# Patient Record
Sex: Male | Born: 1985 | Race: Black or African American | Hispanic: No | Smoking: Current every day smoker
Health system: Southern US, Community
[De-identification: ages and names within clinical notes are randomized; demographics above are authoritative.]

## PROBLEM LIST (undated history)

## (undated) DIAGNOSIS — F419 Anxiety disorder, unspecified: Secondary | ICD-10-CM

## (undated) DIAGNOSIS — D649 Anemia, unspecified: Secondary | ICD-10-CM

## (undated) DIAGNOSIS — I10 Essential (primary) hypertension: Secondary | ICD-10-CM

## (undated) DIAGNOSIS — R569 Unspecified convulsions: Secondary | ICD-10-CM

## (undated) DIAGNOSIS — F329 Major depressive disorder, single episode, unspecified: Secondary | ICD-10-CM

## (undated) DIAGNOSIS — F32A Depression, unspecified: Secondary | ICD-10-CM

## (undated) HISTORY — PX: WISDOM TOOTH EXTRACTION: SHX21

## (undated) HISTORY — DX: Anemia, unspecified: D64.9

---
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
## 2007-11-02 ENCOUNTER — Emergency Department (HOSPITAL_COMMUNITY): Admission: EM | Admit: 2007-11-02 | Discharge: 2007-11-02 | Payer: Self-pay | Admitting: Emergency Medicine

## 2007-11-03 ENCOUNTER — Emergency Department (HOSPITAL_COMMUNITY): Admission: EM | Admit: 2007-11-03 | Discharge: 2007-11-03 | Payer: Self-pay | Admitting: Emergency Medicine

## 2008-11-24 ENCOUNTER — Emergency Department (HOSPITAL_COMMUNITY): Admission: EM | Admit: 2008-11-24 | Discharge: 2008-11-24 | Payer: Self-pay | Admitting: Emergency Medicine

## 2008-11-26 ENCOUNTER — Emergency Department (HOSPITAL_COMMUNITY): Admission: EM | Admit: 2008-11-26 | Discharge: 2008-11-26 | Payer: Self-pay | Admitting: Emergency Medicine

## 2009-07-14 ENCOUNTER — Emergency Department (HOSPITAL_COMMUNITY): Admission: EM | Admit: 2009-07-14 | Discharge: 2009-07-14 | Payer: Self-pay | Admitting: Emergency Medicine

## 2009-07-16 ENCOUNTER — Emergency Department (HOSPITAL_COMMUNITY): Admission: EM | Admit: 2009-07-16 | Discharge: 2009-07-16 | Payer: Self-pay | Admitting: Family Medicine

## 2010-01-08 ENCOUNTER — Emergency Department (HOSPITAL_COMMUNITY): Admission: EM | Admit: 2010-01-08 | Discharge: 2010-01-08 | Payer: Self-pay | Admitting: Family Medicine

## 2010-01-09 ENCOUNTER — Emergency Department (HOSPITAL_COMMUNITY): Admission: EM | Admit: 2010-01-09 | Discharge: 2010-01-09 | Payer: Self-pay | Admitting: Emergency Medicine

## 2010-10-18 ENCOUNTER — Emergency Department (HOSPITAL_COMMUNITY)
Admission: EM | Admit: 2010-10-18 | Discharge: 2010-10-18 | Disposition: A | Discharge status: Other Acute Inpatient Hospital | Payer: Self-pay | Source: Home / Self Care | Admitting: Family Medicine

## 2010-10-18 ENCOUNTER — Emergency Department (HOSPITAL_COMMUNITY)
Admission: EM | Admit: 2010-10-18 | Discharge: 2010-10-19 | Payer: Self-pay | Source: Home / Self Care | Admitting: Emergency Medicine

## 2010-10-24 ENCOUNTER — Emergency Department (HOSPITAL_COMMUNITY)
Admission: EM | Admit: 2010-10-24 | Discharge: 2010-10-25 | Payer: Self-pay | Source: Home / Self Care | Admitting: Emergency Medicine

## 2011-01-09 LAB — COMPREHENSIVE METABOLIC PANEL
ALT: 13 U/L (ref 0–53)
ALT: 13 U/L (ref 0–53)
Alkaline Phosphatase: 57 U/L (ref 39–117)
Alkaline Phosphatase: 65 U/L (ref 39–117)
BUN: 9 mg/dL (ref 6–23)
CO2: 24 mEq/L (ref 19–32)
CO2: 28 mEq/L (ref 19–32)
Calcium: 9.5 mg/dL (ref 8.4–10.5)
Chloride: 103 mEq/L (ref 96–112)
GFR calc non Af Amer: >60 mL/min (ref >60)
GFR calc non Af Amer: >60 mL/min (ref >60)
Glucose, Bld: 113 mg/dL — ABNORMAL HIGH (ref 70–99)
Glucose, Bld: 93 mg/dL (ref 70–99)
Potassium: 3.8 mEq/L (ref 3.5–5.1)
Sodium: 138 mEq/L (ref 135–145)
Sodium: 141 mEq/L (ref 135–145)
Total Bilirubin: 0.6 mg/dL (ref 0.3–1.2)
Total Protein: 6.7 g/dL (ref 6.0–8.3)

## 2011-01-09 LAB — URINALYSIS, ROUTINE W REFLEX MICROSCOPIC
Bilirubin Urine: NEGATIVE
Glucose, UA: NEGATIVE mg/dL
Ketones, ur: NEGATIVE mg/dL
Ketones, ur: NEGATIVE mg/dL
Nitrite: NEGATIVE
Protein, ur: NEGATIVE mg/dL
Specific Gravity, Urine: 1.026 (ref 1.005–1.030)
Urobilinogen, UA: 1 mg/dL (ref 0.0–1.0)
pH: 6.5 (ref 5.0–8.0)

## 2011-01-09 LAB — POCT URINALYSIS DIPSTICK
Glucose, UA: NEGATIVE mg/dL
Nitrite: NEGATIVE
Protein, ur: 30 mg/dL — AB
Urobilinogen, UA: 1 mg/dL (ref 0.0–1.0)

## 2011-01-09 LAB — CBC
HCT: 37.9 % — ABNORMAL LOW (ref 39.0–52.0)
HCT: 40.6 % (ref 39.0–52.0)
Hemoglobin: 13.2 g/dL (ref 13.0–17.0)
Hemoglobin: 14.2 g/dL (ref 13.0–17.0)
MCHC: 34.8 g/dL (ref 30.0–36.0)
MCHC: 35 g/dL (ref 30.0–36.0)
MCV: 90.5 fL (ref 78.0–100.0)
RBC: 4.43 MIL/uL (ref 4.22–5.81)
RDW: 12.6 % (ref 11.5–15.5)

## 2011-01-09 LAB — LIPASE, BLOOD
Lipase: 20 U/L (ref 11–59)
Lipase: 39 U/L (ref 11–59)

## 2011-01-09 LAB — RAPID URINE DRUG SCREEN, HOSP PERFORMED: Barbiturates: NOT DETECTED

## 2011-01-09 LAB — URINE MICROSCOPIC-ADD ON

## 2011-01-09 LAB — DIFFERENTIAL
Basophils Absolute: 0 10*3/uL (ref 0.0–0.1)
Basophils Absolute: 0 10*3/uL (ref 0.0–0.1)
Basophils Relative: 0 % (ref 0–1)
Basophils Relative: 0 % (ref 0–1)
Eosinophils Absolute: 0 10*3/uL (ref 0.0–0.7)
Eosinophils Absolute: 0.4 10*3/uL (ref 0.0–0.7)
Eosinophils Relative: 6 % — ABNORMAL HIGH (ref 0–5)
Lymphs Abs: 3.6 10*3/uL (ref 0.7–4.0)
Neutro Abs: 13.3 10*3/uL — ABNORMAL HIGH (ref 1.7–7.7)
Neutrophils Relative %: 44 % (ref 43–77)
Neutrophils Relative %: 86 % — ABNORMAL HIGH (ref 43–77)

## 2011-02-13 LAB — CULTURE, ROUTINE-ABSCESS

## 2011-05-28 ENCOUNTER — Emergency Department (HOSPITAL_COMMUNITY): Payer: Medicaid Other

## 2011-05-28 ENCOUNTER — Emergency Department (HOSPITAL_COMMUNITY)
Admission: EM | Admit: 2011-05-28 | Discharge: 2011-05-28 | Disposition: A | Discharge status: Home / Self Care | Payer: Medicaid Other | Attending: Emergency Medicine | Admitting: Emergency Medicine

## 2011-05-28 DIAGNOSIS — F17200 Nicotine dependence, unspecified, uncomplicated: Secondary | ICD-10-CM | POA: Insufficient documentation

## 2011-05-28 DIAGNOSIS — H6120 Impacted cerumen, unspecified ear: Secondary | ICD-10-CM | POA: Insufficient documentation

## 2011-05-28 DIAGNOSIS — R079 Chest pain, unspecified: Secondary | ICD-10-CM | POA: Insufficient documentation

## 2011-05-28 DIAGNOSIS — H60399 Other infective otitis externa, unspecified ear: Secondary | ICD-10-CM | POA: Insufficient documentation

## 2011-05-31 ENCOUNTER — Inpatient Hospital Stay (INDEPENDENT_AMBULATORY_CARE_PROVIDER_SITE_OTHER)
Admission: RE | Admit: 2011-05-31 | Discharge: 2011-05-31 | Disposition: A | Discharge status: Home / Self Care | Payer: Medicaid Other | Source: Ambulatory Visit | Attending: Family Medicine | Admitting: Family Medicine

## 2011-05-31 DIAGNOSIS — H6120 Impacted cerumen, unspecified ear: Secondary | ICD-10-CM

## 2012-07-30 ENCOUNTER — Encounter (HOSPITAL_COMMUNITY): Payer: Self-pay | Admitting: Family Medicine

## 2012-07-30 ENCOUNTER — Emergency Department (HOSPITAL_COMMUNITY)
Admission: EM | Admit: 2012-07-30 | Discharge: 2012-07-30 | Disposition: A | Discharge status: Home / Self Care | Payer: Medicaid Other | Attending: Emergency Medicine | Admitting: Emergency Medicine

## 2012-07-30 DIAGNOSIS — I10 Essential (primary) hypertension: Secondary | ICD-10-CM | POA: Insufficient documentation

## 2012-07-30 DIAGNOSIS — J392 Other diseases of pharynx: Secondary | ICD-10-CM

## 2012-07-30 DIAGNOSIS — F17200 Nicotine dependence, unspecified, uncomplicated: Secondary | ICD-10-CM | POA: Insufficient documentation

## 2012-07-30 HISTORY — DX: Essential (primary) hypertension: I10

## 2012-07-30 MED ORDER — OXYCODONE-ACETAMINOPHEN 5-325 MG PO TABS: 1.0000 | ORAL_TABLET | Freq: Four times a day (QID) | ORAL | Status: DC | PRN

## 2012-07-30 MED ORDER — OXYCODONE-ACETAMINOPHEN 5-325 MG PO TABS
2.0000 | ORAL_TABLET | Freq: Once | ORAL | Status: AC
  Administered 2012-07-30: 2 via ORAL
  Filled 2012-07-30: qty 2

## 2012-07-30 NOTE — ED Notes (Signed)
Pt states he has a ride home

## 2012-07-30 NOTE — ED Notes (Signed)
Pt states "I ain't ate nothing since Sunday, cause it hurts so bad when I eat." Pt speaks in complete sentences. No acute distress.

## 2012-07-30 NOTE — ED Provider Notes (Signed)
History     CSN: [id number]  Arrival date & time 07/30/12  2001   First MD Initiated Contact with Patient 07/30/12 2035      Chief Complaint  Patient presents with  . Rash    (Consider location/radiation/quality/duration/timing/severity/associated sxs/prior treatment) HPI Comments: Patient presents with complaint of rash on his upper palate. Patient states that if began on Sunday after he drank 3 liters of straight vodka. Patient states that he noticed the rash while incarcerated on Monday. Patient was incarcerated for assault of his step-father. He states that his throat is sore. Denis fever or chills. Denies difficulty swallowing or SOB.                                     The history is provided by the patient. No language interpreter was used.    Past Medical History  Diagnosis Date  . Hypertension     History reviewed. No pertinent past surgical history.  History reviewed. No pertinent family history.  History  Substance Use Topics  . Smoking status: Current Every Day Smoker -- 2.0 packs/day    Types: Cigarettes  . Smokeless tobacco: Not on file  . Alcohol Use: Yes     heavy drinker      Review of Systems  Constitutional: Negative for fever and chills.  HENT: Positive for sore throat. Negative for trouble swallowing.   Respiratory: Negative for shortness of breath.     Allergies  Review of patient's allergies indicates no known allergies.  Home Medications  No current outpatient prescriptions on file.  BP 151/100  Pulse 72  Temp 98.3 F (36.8 C) (Oral)  Resp 18  SpO2 98%  Physical Exam  Nursing note and vitals reviewed. Constitutional: He appears well-developed and well-nourished. No distress.  HENT:  Head: Normocephalic and atraumatic.  Mouth/Throat: No oropharyngeal exudate.       Patients throat mildly erythematous with some irritation to the upper palate.  Eyes: Conjunctivae normal and EOM are normal.  Neck: Normal range of motion.  Neck supple.  Cardiovascular: Normal rate, regular rhythm and normal heart sounds.   Pulmonary/Chest: Effort normal and breath sounds normal.  Abdominal: Soft. Bowel sounds are normal.  Neurological: He is alert.  Skin: Skin is warm and dry.    ED Course  Procedures (including critical care time)  Labs Reviewed - No data to display No results found.   1. Throat irritation       MDM  Patient presented with sore throat and irritation of the upper palate after heavy drinking on Sunday. Patient given pain medication with some relief. Patient discharged with instructions on supportive care. No red flags for bacterial pharyngitis or peritonsillar abscess.        Pixie Casino, PA-C 07/31/12 0145

## 2012-07-30 NOTE — ED Notes (Signed)
Pt reports he has been drinking a lot of alcohol lately. States noticed bumps to upper mouth x 3 days ago. Reports bumps are painful.

## 2012-07-31 NOTE — ED Provider Notes (Signed)
Medical screening examination/treatment/procedure(s) were performed by non-physician practitioner and as supervising physician I was immediately available for consultation/collaboration. Ronan Duecker, MD, FACEP   Caeleigh Prohaska L Samyra Limb, MD 07/31/12 2301 

## 2014-03-04 ENCOUNTER — Encounter (HOSPITAL_COMMUNITY): Payer: Self-pay | Admitting: Emergency Medicine

## 2014-03-04 ENCOUNTER — Emergency Department (HOSPITAL_COMMUNITY): Payer: Medicaid Other

## 2014-03-04 ENCOUNTER — Emergency Department (HOSPITAL_COMMUNITY)
Admission: EM | Admit: 2014-03-04 | Discharge: 2014-03-04 | Disposition: A | Discharge status: Home / Self Care | Payer: Medicaid Other | Attending: Emergency Medicine | Admitting: Emergency Medicine

## 2014-03-04 DIAGNOSIS — R109 Unspecified abdominal pain: Secondary | ICD-10-CM

## 2014-03-04 DIAGNOSIS — F17200 Nicotine dependence, unspecified, uncomplicated: Secondary | ICD-10-CM | POA: Insufficient documentation

## 2014-03-04 DIAGNOSIS — K529 Noninfective gastroenteritis and colitis, unspecified: Secondary | ICD-10-CM

## 2014-03-04 DIAGNOSIS — I10 Essential (primary) hypertension: Secondary | ICD-10-CM | POA: Insufficient documentation

## 2014-03-04 DIAGNOSIS — K5289 Other specified noninfective gastroenteritis and colitis: Secondary | ICD-10-CM | POA: Insufficient documentation

## 2014-03-04 LAB — COMPREHENSIVE METABOLIC PANEL
ALBUMIN: 4.7 g/dL (ref 3.5–5.2)
ALK PHOS: 70 U/L (ref 39–117)
ALT: 8 U/L (ref 0–53)
AST: 19 U/L (ref 0–37)
BUN: 9 mg/dL (ref 6–23)
CHLORIDE: 100 meq/L (ref 96–112)
CO2: 20 mEq/L (ref 19–32)
Calcium: 9.7 mg/dL (ref 8.4–10.5)
Creatinine, Ser: 1.03 mg/dL (ref 0.50–1.35)
GFR calc Af Amer: >90 mL/min (ref >90)
GFR calc non Af Amer: >90 mL/min (ref >90)
Glucose, Bld: 95 mg/dL (ref 70–99)
POTASSIUM: 3.4 meq/L — AB (ref 3.7–5.3)
Sodium: 140 mEq/L (ref 137–147)
Total Bilirubin: 1.2 mg/dL (ref 0.3–1.2)
Total Protein: 7.8 g/dL (ref 6.0–8.3)

## 2014-03-04 LAB — URINALYSIS, ROUTINE W REFLEX MICROSCOPIC
Bilirubin Urine: NEGATIVE
Glucose, UA: NEGATIVE mg/dL
HGB URINE DIPSTICK: NEGATIVE
Ketones, ur: 40 mg/dL — AB
Leukocytes, UA: NEGATIVE
NITRITE: NEGATIVE
PH: 5.5 (ref 5.0–8.0)
Protein, ur: NEGATIVE mg/dL
SPECIFIC GRAVITY, URINE: 1.007 (ref 1.005–1.030)
Urobilinogen, UA: 0.2 mg/dL (ref 0.0–1.0)

## 2014-03-04 LAB — LIPASE, BLOOD: Lipase: 21 U/L (ref 11–59)

## 2014-03-04 LAB — RAPID URINE DRUG SCREEN, HOSP PERFORMED
Amphetamines: NOT DETECTED
BARBITURATES: NOT DETECTED
BENZODIAZEPINES: NOT DETECTED
COCAINE: NOT DETECTED
Opiates: POSITIVE — AB
TETRAHYDROCANNABINOL: POSITIVE — AB

## 2014-03-04 LAB — CBC WITH DIFFERENTIAL/PLATELET
BASOS ABS: 0 10*3/uL (ref 0.0–0.1)
BASOS PCT: 0 % (ref 0–1)
Eosinophils Absolute: 0.2 10*3/uL (ref 0.0–0.7)
Eosinophils Relative: 2 % (ref 0–5)
HCT: 47 % (ref 39.0–52.0)
HEMOGLOBIN: 16.1 g/dL (ref 13.0–17.0)
Lymphocytes Relative: 40 % (ref 12–46)
Lymphs Abs: 4.4 10*3/uL — ABNORMAL HIGH (ref 0.7–4.0)
MCH: 31.1 pg (ref 26.0–34.0)
MCHC: 34.3 g/dL (ref 30.0–36.0)
MCV: 90.9 fL (ref 78.0–100.0)
MONOS PCT: 8 % (ref 3–12)
Monocytes Absolute: 0.9 10*3/uL (ref 0.1–1.0)
NEUTROS ABS: 5.4 10*3/uL (ref 1.7–7.7)
NEUTROS PCT: 50 % (ref 43–77)
Platelets: 248 10*3/uL (ref 150–400)
RBC: 5.17 MIL/uL (ref 4.22–5.81)
RDW: 15 % (ref 11.5–15.5)
WBC: 10.9 10*3/uL — ABNORMAL HIGH (ref 4.0–10.5)

## 2014-03-04 LAB — CK: Total CK: 361 U/L — ABNORMAL HIGH (ref 7–232)

## 2014-03-04 MED ORDER — MORPHINE SULFATE 4 MG/ML IJ SOLN
4.0000 mg | Freq: Once | INTRAMUSCULAR | Status: AC
  Administered 2014-03-04: 4 mg via INTRAVENOUS
  Filled 2014-03-04: qty 1

## 2014-03-04 MED ORDER — ONDANSETRON HCL 4 MG PO TABS: 4.0000 mg | ORAL_TABLET | Freq: Three times a day (TID) | ORAL | Status: DC | PRN

## 2014-03-04 MED ORDER — SODIUM CHLORIDE 0.9 % IV BOLUS (SEPSIS)
1000.0000 mL | Freq: Once | INTRAVENOUS | Status: AC
  Administered 2014-03-04: 1000 mL via INTRAVENOUS

## 2014-03-04 MED ORDER — METRONIDAZOLE 500 MG PO TABS: 500.0000 mg | ORAL_TABLET | Freq: Three times a day (TID) | ORAL | Status: DC

## 2014-03-04 MED ORDER — ONDANSETRON HCL 4 MG/2ML IJ SOLN
4.0000 mg | Freq: Once | INTRAMUSCULAR | Status: AC
  Administered 2014-03-04: 4 mg via INTRAVENOUS
  Filled 2014-03-04: qty 2

## 2014-03-04 MED ORDER — IOHEXOL 350 MG/ML SOLN
100.0000 mL | Freq: Once | INTRAVENOUS | Status: AC | PRN
  Administered 2014-03-04: 100 mL via INTRAVENOUS

## 2014-03-04 MED ORDER — CIPROFLOXACIN HCL 500 MG PO TABS: 500.0000 mg | ORAL_TABLET | Freq: Two times a day (BID) | ORAL | Status: DC

## 2014-03-04 MED ORDER — OXYCODONE-ACETAMINOPHEN 5-325 MG PO TABS: 1.0000 | ORAL_TABLET | ORAL | Status: DC | PRN

## 2014-03-04 NOTE — Discharge Instructions (Signed)
Read the information below.  Use the prescribed medication as directed.  Please discuss all new medications with your pharmacist.  Do not take additional tylenol while taking the prescribed pain medication to avoid overdose.  You may return to the Emergency Department at any time for worsening condition or any new symptoms that concern you.  If you develop high fevers, worsening abdominal pain, uncontrolled vomiting, or are unable to tolerate fluids by mouth, return to the ER for a recheck.     Colitis Colitis is inflammation of the colon. Colitis can be a short-term or long-standing (chronic) illness. Crohn's disease and ulcerative colitis are 2 types of colitis which are chronic. They usually require lifelong treatment. CAUSES  There are many different causes of colitis, including:  Viruses.  Germs (bacteria).  Medicine reactions. SYMPTOMS   Diarrhea.  Intestinal bleeding.  Pain.  Fever.  Throwing up (vomiting).  Tiredness (fatigue).  Weight loss.  Bowel blockage. DIAGNOSIS  The diagnosis of colitis is based on examination and stool or blood tests. X-rays, CT scan, and colonoscopy may also be needed. TREATMENT  Treatment may include:  Fluids given through the vein (intravenously).  Bowel rest (nothing to eat or drink for a period of time).  Medicine for pain and diarrhea.  Medicines (antibiotics) that kill germs.  Cortisone medicines.  Surgery. HOME CARE INSTRUCTIONS   Get plenty of rest.  Drink enough water and fluids to keep your urine clear or pale yellow.  Eat a well-balanced diet.  Call your caregiver for follow-up as recommended. SEEK IMMEDIATE MEDICAL CARE IF:   You develop chills.  You have an oral temperature above 102 F (38.9 C), not controlled by medicine.  You have extreme weakness, fainting, or dehydration.  You have repeated vomiting.  You develop severe belly (abdominal) pain or are passing bloody or tarry stools. MAKE SURE YOU:    Understand these instructions.  Will watch your condition.  Will get help right away if you are not doing well or get worse. Document Released: 11/23/2004 Document Revised: 01/08/2012 Document Reviewed: 02/18/2010 [facility] Patient Information 2014 [place], [place].

## 2014-03-04 NOTE — ED Notes (Signed)
Pt asked to be brought to bathroom, when attempted to get pt up pt complained of pain, and when he would get his pain medication. Pt also began dry heave, pt also complained of feeling hot.  He asked if he could have something to drink, and I stated "not until the dr said it would be ok to, and with his nausea it would not be a good idea".  He then said he felt dehydrated again I stated "that he had fluids running so he should not be dehydrated".  I rechecked pts vital all were with in normal limit.  Pts main concern is pain meds, I let the RN know.

## 2014-03-04 NOTE — ED Notes (Signed)
Bed: WA03 Expected date:  Expected time:  Means of arrival:  Comments: EMS gen pain

## 2014-03-04 NOTE — ED Notes (Signed)
/    Ward GivensIva L Gottfried Standish, MD 03/06/14 1105

## 2014-03-04 NOTE — ED Notes (Signed)
Per PTAR pt states he woke up with ab pain that progressed to all over his body. No n/v/d. Pt alert.

## 2014-03-04 NOTE — ED Provider Notes (Signed)
Medical screening examination/treatment/procedure(s) were performed by non-physician practitioner and as supervising physician I was immediately available for consultation/collaboration.   EKG Interpretation   Date/Time:  Wednesday Mar 04 2014 09:25:03 EDT Ventricular Rate:  101 PR Interval:  121 QRS Duration: 89 QT Interval:  346 QTC Calculation: 448 R Axis:   92 Text Interpretation:  Sinus tachycardia Borderline right axis deviation ST  elevation suggests acute pericarditis Baseline wander in lead(s) V4 Since  last tracing rate faster Confirmed by Tiaunna Buford  MD-I, Deonne Rooks ([id number]) on 03/04/2014  9:29:05 AM      Maxwell AlbeIva Jacqualin Shirkey, MD, Maxwell DellFACEP   Maxwell Tiedt L Joniyah Mallinger, MD 03/04/14 1501

## 2014-03-04 NOTE — ED Notes (Signed)
Patient transported to CT 

## 2014-03-04 NOTE — ED Provider Notes (Signed)
CSN: [id number]     Arrival date & time 03/04/14  0614 History   First MD Initiated Contact with Patient 03/04/14 [phone]     Chief Complaint  Patient presents with  . Abdominal Pain     (Consider location/radiation/quality/duration/timing/severity/associated sxs/prior Treatment) The history is provided by the patient.    Pt reports he began having lower abdominal pain around 2am today, and states it has now spread to include his entire body, specifically his arms and legs.  It is described as a soreness, as if someone has been punching or kicking him.  Has had N/V x 2.  Emesis was the cranberry juice that he drank.  Denies fevers, urinary or bowel changes, testicular pain or swelling, penile discharge.  Denies new or different foods, sick contacts, recent travel.  Has never had abdominal surgery.  Drinks alcohol but not recently.  He did take a pill yesterday from [facility] meant to "cleanse your system" of drugs, but states he has taken this pill before without any problems.  Denies working.  Denies being out the heat for extended periods.  Reports he smokes marijuana but denies other drug use.  Has hx hypertension, diagnosed while in jail, has not followed up and takes nothing for this.     Past Medical History  Diagnosis Date  . Hypertension    History reviewed. No pertinent past surgical history. No family history on file. History  Substance Use Topics  . Smoking status: Current Every Day Smoker -- 2.00 packs/day    Types: Cigarettes  . Smokeless tobacco: Not on file  . Alcohol Use: Yes     Comment: heavy drinker    Review of Systems  Constitutional: Negative for fever.  Respiratory: Negative for cough and shortness of breath.   Cardiovascular: Negative for chest pain.  Gastrointestinal: Positive for abdominal pain. Negative for nausea, vomiting, diarrhea, constipation and blood in stool.  Genitourinary: Negative for dysuria, urgency, frequency, hematuria, discharge, scrotal swelling,  penile pain and testicular pain.  All other systems reviewed and are negative.     Allergies  Review of patient's allergies indicates no known allergies.  Home Medications   Prior to Admission medications   Medication Sig Start Date End Date Taking? Authorizing Provider  OVER THE COUNTER MEDICATION Take 1 tablet by mouth once. For   Yes Historical Provider, MD   BP 147/94  Pulse 73  Temp(Src) 97.5 F (36.4 C) (Oral)  Resp 18  Ht 5\' 6"  (1.676 m)  Wt 140 lb (63.504 kg)  BMI 22.61 kg/m2  SpO2 100% Physical Exam  Nursing note and vitals reviewed. Constitutional: He appears well-developed and well-nourished. No distress.  HENT:  Head: Normocephalic and atraumatic.  Neck: Neck supple.  Cardiovascular: Normal rate, regular rhythm and intact distal pulses.   Pulses intact and equal bilaterally in upper and lower extremities  Pulmonary/Chest: Effort normal and breath sounds normal. No respiratory distress. He has no wheezes. He has no rales.  Abdominal: Soft. He exhibits no distension and no mass. There is tenderness in the epigastric area, suprapubic area, left upper quadrant and left lower quadrant. There is no rebound and no guarding.  Neurological: He is alert. He exhibits normal muscle tone.  Skin: He is not diaphoretic.    ED Course  Procedures (including critical care time) Labs Review Labs Reviewed  CBC WITH DIFFERENTIAL - Abnormal; Notable for the following:    WBC 10.9 (*)    Lymphs Abs 4.4 (*)    All other components  within normal limits  COMPREHENSIVE METABOLIC PANEL - Abnormal; Notable for the following:    Potassium 3.4 (*)    All other components within normal limits  CK - Abnormal; Notable for the following:    Total CK 361 (*)    All other components within normal limits  URINALYSIS, ROUTINE W REFLEX MICROSCOPIC - Abnormal; Notable for the following:    Ketones, ur 40 (*)    All other components within normal limits  URINE RAPID DRUG SCREEN (HOSP  PERFORMED) - Abnormal; Notable for the following:    Opiates POSITIVE (*)    Tetrahydrocannabinol POSITIVE (*)    All other components within normal limits  LIPASE, BLOOD    Imaging Review Ct Angio Chest Pe W/cm &/or Wo Cm  03/04/2014   CLINICAL DATA:  Chest pain and tachycardia.  EXAM: CT ANGIOGRAPHY CHEST WITH CONTRAST  TECHNIQUE: Multidetector CT imaging of the chest was performed using the standard protocol during bolus administration of intravenous contrast. Multiplanar CT image reconstructions and MIPs were obtained to evaluate the vascular anatomy.  CONTRAST:  100mL OMNIPAQUE IOHEXOL 350 MG/ML SOLN  COMPARISON:  Chest x-ray on 05/28/2011  FINDINGS: The pulmonary arteries are well opacified and there is no evidence of pulmonary embolism. The thoracic aorta is also well opacified and normally patent without evidence of aneurysm or dissection.  Significant pulmonary blebs are present in the upper lung zones bilaterally. There is no evidence of pneumothorax, consolidation or mass. No edema or pleural fluid is identified. The heart size is normal. No pericardial fluid is identified. No masses or enlarged lymph nodes.  Bony structures are unremarkable. Visualized upper abdominal structures are unremarkable.  Review of the MIP images confirms the above findings.  IMPRESSION: No evidence of pulmonary embolism or other acute findings. Significant blebs are noted in the upper lung zones bilaterally.   Electronically Signed   By: Irish LackGlenn  Yamagata M.D.   On: 03/04/2014 10:59   Ct Angio Abd/pel W/ And/or W/o  03/04/2014   CLINICAL DATA:  Abdominal pain.  Hypertension.  EXAM: CT ANGIOGRAPHY ABDOMEN AND PELVIS  TECHNIQUE: Multidetector CT imaging of the abdomen and pelvis was performed using the standard protocol during bolus administration of intravenous contrast. Multiplanar reconstructed images including MIPs were obtained and reviewed to evaluate the vascular anatomy.  CONTRAST:  100 mL Omnipaque 350   COMPARISON:  10/24/2010  FINDINGS: ARTERIAL FINDINGS:  Aorta: Minimal plaque along the posterior wall of the aorta on sequence 6, image 46. No evidence for dissection or aneurysm.  Celiac axis: Patent. Limited evaluation of the branch vessels.  Superior mesenteric: Patent.  Left renal:          Patent.  Right renal: Patent. There is an accessory inferior right renal artery.  Inferior mesenteric: Patent.  Left iliac: The left common, internal and external iliac artery are patent without dilatation or stenosis.  Right iliac: The right common, internal and external iliac arteries are patent without dissection or stenosis.  Venous findings: Iliac veins and IVC are patent. Bilateral renal veins are patent. Portal venous system is widely patent. Hepatic veins are patent.  Review of the MIP images confirms the above findings.  NONVASCULAR FINDINGS:  Normal appearance of the liver, gallbladder, spleen, pancreas and adrenal glands. Normal appearance of the right kidney. There are at least 3 low-density structures in left kidney that were present on the previous examination and probably represent small cortical cysts. Fluid in the urinary bladder. No gross abnormality to the prostate. There is  no significant free fluid or lymphadenopathy in the abdomen or pelvis. There is mild wall thickening in the left colon but this could be related to under distension. There is gas within the appendix. No acute bone abnormality.  IMPRESSION: No gross abnormality to the abdominal vasculature. Minimal plaque in the distal abdominal aorta without dissection or aneurysm.  Mild wall thickening of the sigmoid colon and left colon. This finding is probably related to under distention. Recommend clinical correlation with regards to mild colitis.  Left renal cysts.   Electronically Signed   By: Richarda OverlieAdam  Henn M.D.   On: 03/04/2014 12:37     EKG Interpretation   Date/Time:  Wednesday Mar 04 2014 09:25:03 EDT Ventricular Rate:  101 PR Interval:   121 QRS Duration: 89 QT Interval:  346 QTC Calculation: 448 R Axis:   92 Text Interpretation:  Sinus tachycardia Borderline right axis deviation ST  elevation suggests acute pericarditis Baseline wander in lead(s) V4 Since  last tracing rate faster Confirmed by KNAPP  MD-I, IVA ([id number]) on 03/04/2014  9:29:05 AM      9:19 AM Pt with continued pain.  HR was 117 when I walked into the room.  Pt has complained of bilateral arm pain, chest pain, back pain, abdominal pain, and bilateral leg pain.   Reexamination of the abdomen:  Diffuse tenderness of lower abdomen without guarding, or rebound.    9:31 AM Discussed pt with Dr Lynelle DoctorKnapp, will do CT angio chest, abd/pelvis to r/o PE, r/o dissection.    Filed Vitals:   03/04/14 1339  BP: 118/68  Pulse: 70  Temp:   Resp: 20     MDM   Final diagnoses:  Colitis    Afebrile nontoxic patient with left sided abdominal pain and pain throughout body, also N/V found to have colitis on CT.  Mild WBC count elevation.   CK mildly elevated, ketones in urine - likely dehydration.  Given IVF.  Patient did oral PO trial prior to discharge.  D/C home with cipro, flagyl, pain and nausea medication, GI follow up.  Discussed result, findings, treatment, and follow up  with patient.  Pt given return precautions.  Pt verbalizes understanding and agrees with plan.       Trixie Dredgemily Keelan Tripodi, PA-C 03/04/14 1435

## 2014-07-15 ENCOUNTER — Encounter (HOSPITAL_COMMUNITY): Payer: Self-pay | Admitting: Emergency Medicine

## 2014-07-15 ENCOUNTER — Emergency Department (HOSPITAL_COMMUNITY)
Admission: EM | Admit: 2014-07-15 | Discharge: 2014-07-15 | Disposition: A | Discharge status: Home / Self Care | Payer: Medicaid Other | Source: Home / Self Care | Attending: Emergency Medicine | Admitting: Emergency Medicine

## 2014-07-15 ENCOUNTER — Emergency Department (HOSPITAL_COMMUNITY)
Admission: EM | Admit: 2014-07-15 | Discharge: 2014-07-15 | Discharge status: Against Medical Advice | Payer: Medicaid Other | Attending: Emergency Medicine | Admitting: Emergency Medicine

## 2014-07-15 DIAGNOSIS — M791 Myalgia, unspecified site: Secondary | ICD-10-CM

## 2014-07-15 DIAGNOSIS — IMO0001 Reserved for inherently not codable concepts without codable children: Secondary | ICD-10-CM | POA: Insufficient documentation

## 2014-07-15 DIAGNOSIS — I10 Essential (primary) hypertension: Secondary | ICD-10-CM

## 2014-07-15 DIAGNOSIS — R52 Pain, unspecified: Secondary | ICD-10-CM | POA: Insufficient documentation

## 2014-07-15 DIAGNOSIS — F17200 Nicotine dependence, unspecified, uncomplicated: Secondary | ICD-10-CM | POA: Insufficient documentation

## 2014-07-15 DIAGNOSIS — R079 Chest pain, unspecified: Secondary | ICD-10-CM | POA: Insufficient documentation

## 2014-07-15 LAB — CBC WITH DIFFERENTIAL/PLATELET
BASOS ABS: 0.1 10*3/uL (ref 0.0–0.1)
BASOS PCT: 1 % (ref 0–1)
Eosinophils Absolute: 0.2 10*3/uL (ref 0.0–0.7)
Eosinophils Relative: 3 % (ref 0–5)
HEMATOCRIT: 44.9 % (ref 39.0–52.0)
Hemoglobin: 15.7 g/dL (ref 13.0–17.0)
Lymphocytes Relative: 55 % — ABNORMAL HIGH (ref 12–46)
Lymphs Abs: 3 10*3/uL (ref 0.7–4.0)
MCH: 32 pg (ref 26.0–34.0)
MCHC: 35 g/dL (ref 30.0–36.0)
MCV: 91.6 fL (ref 78.0–100.0)
Monocytes Absolute: 0.3 10*3/uL (ref 0.1–1.0)
Monocytes Relative: 6 % (ref 3–12)
NEUTROS ABS: 1.9 10*3/uL (ref 1.7–7.7)
Neutrophils Relative %: 35 % — ABNORMAL LOW (ref 43–77)
Platelets: 185 10*3/uL (ref 150–400)
RBC: 4.9 MIL/uL (ref 4.22–5.81)
RDW: 13 % (ref 11.5–15.5)
WBC: 5.4 10*3/uL (ref 4.0–10.5)

## 2014-07-15 LAB — CK: Total CK: 184 U/L (ref 7–232)

## 2014-07-15 LAB — BASIC METABOLIC PANEL
ANION GAP: 12 (ref 5–15)
BUN: 15 mg/dL (ref 6–23)
CALCIUM: 9.9 mg/dL (ref 8.4–10.5)
CHLORIDE: 101 meq/L (ref 96–112)
CO2: 25 mEq/L (ref 19–32)
Creatinine, Ser: 0.88 mg/dL (ref 0.50–1.35)
GFR calc Af Amer: >90 mL/min (ref >90)
GFR calc non Af Amer: >90 mL/min (ref >90)
Glucose, Bld: 85 mg/dL (ref 70–99)
Potassium: 4.2 mEq/L (ref 3.7–5.3)
Sodium: 138 mEq/L (ref 137–147)

## 2014-07-15 MED ORDER — OXYCODONE-ACETAMINOPHEN 5-325 MG PO TABS
2.0000 | ORAL_TABLET | Freq: Once | ORAL | Status: AC
  Administered 2014-07-15: 2 via ORAL
  Filled 2014-07-15: qty 2

## 2014-07-15 MED ORDER — KETOROLAC TROMETHAMINE 30 MG/ML IJ SOLN
30.0000 mg | Freq: Once | INTRAMUSCULAR | Status: AC
  Administered 2014-07-15: 30 mg via INTRAVENOUS
  Filled 2014-07-15: qty 1

## 2014-07-15 MED ORDER — SODIUM CHLORIDE 0.9 % IV BOLUS (SEPSIS)
1000.0000 mL | Freq: Once | INTRAVENOUS | Status: AC
  Administered 2014-07-15: 1000 mL via INTRAVENOUS

## 2014-07-15 MED ORDER — IBUPROFEN 800 MG PO TABS: 800.0000 mg | ORAL_TABLET | Freq: Three times a day (TID) | ORAL | Status: DC

## 2014-07-15 NOTE — ED Notes (Signed)
Pt. States that the pain medicine did not help him and he wants to go to another hospital. Offered to contact Dr. Effie Shy for pt to speak with. Pt. Refused. Pt requested IV to be taken out. Offered pt. To sign out AMA, pt. Refused to speak. Dr. Effie Shy notified.

## 2014-07-15 NOTE — ED Provider Notes (Signed)
MSE was initiated and I personally evaluated the patient and placed orders (if any) at  11:39 AM on July 15, 2014.  The patient appears stable so that the remainder of the MSE may be completed by another provider. Subjective: Maxwell Faulkner is a(n) 28 y.o. male who presents Severe body aches. 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 He states that this is abnormally heavy work for him and that he generally does lifting at his job. Today he woke up with severe diffuse body aches and myalgias. He denies dark urine however vomited twice. No other complaints at this time.  Objective: Well-developed well-nourished male in no acute distress. He is exquisitely tender to palpation of all of his musculature. No abdominal tenderness. Heart and lung sounds normal.  Assessment: Diffuse myalgias after excessive work. Plan: Patient will get basic labs including a total CK to rule out possible rhabdomyolysis.   Arthor Captain, PA-C 07/15/14 1142  Flint Melter, MD 11/16/14 1003

## 2014-07-15 NOTE — Progress Notes (Signed)
[facility] [employer],   Provided pt with a list of self-pay providers to help patient establish a pcp.

## 2014-07-15 NOTE — ED Provider Notes (Signed)
CSN: [id number]     Arrival date & time 07/15/14  1458 History   First MD Initiated Contact with Patient 07/15/14 1501     Chief Complaint  Patient presents with  . Chest Pain     (Consider location/radiation/quality/duration/timing/severity/associated sxs/prior Treatment) HPI Comments: Patient with past medical history of hypertension, presents emergency department with chief complaints of muscle aches. 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 Patient states that he worked harder than he was used to. He is now complaining of generalized muscle aches. He states that his chest, arms, and legs are very painful. He states that he normally takes Percocet for this pain, but does not have any more medication. He is requesting additional Percocet here. He denies any fevers, shortness of breath, cough, nausea, vomiting, or diarrhea.  He was seen at [facility] ED this morning for the same complaint and left AMA after not receiving any pain medication.  The history is provided by the patient. No language interpreter was used.    Past Medical History  Diagnosis Date  . Hypertension    History reviewed. No pertinent past surgical history. No family history on file. History  Substance Use Topics  . Smoking status: Current Every Day Smoker -- 2.00 packs/day    Types: Cigarettes  . Smokeless tobacco: Not on file  . Alcohol Use: Yes     Comment: heavy drinker    Review of Systems  Constitutional: Negative for fever and chills.  Respiratory: Negative for shortness of breath.   Cardiovascular: Negative for chest pain.  Gastrointestinal: Negative for nausea, vomiting, diarrhea and constipation.  Genitourinary: Negative for dysuria.  Musculoskeletal: Positive for myalgias.  All other systems reviewed and are negative.     Allergies  Review of patient's allergies indicates no known allergies.  Home Medications   Prior to  Admission medications   Not on File   BP 109/76  Temp(Src) 98.2 F (36.8 C) (Oral)  Resp 17  SpO2 100% Physical Exam  Nursing note and vitals reviewed. Constitutional: He is oriented to person, place, and time. He appears well-developed and well-nourished.  HENT:  Head: Normocephalic and atraumatic.  Eyes: Conjunctivae and EOM are normal. Pupils are equal, round, and reactive to light. Right eye exhibits no discharge. Left eye exhibits no discharge. No scleral icterus.  Neck: Normal range of motion. Neck supple. No JVD present.  Cardiovascular: Normal rate, regular rhythm and normal heart sounds.  Exam reveals no gallop and no friction rub.   No murmur heard. Pulmonary/Chest: Effort normal and breath sounds normal. No respiratory distress. He has no wheezes. He has no rales. He exhibits no tenderness.  Clear to auscultation bilaterally  Anterior chest wall tenderness palpation  Abdominal: Soft. He exhibits no distension and no mass. There is no tenderness. There is no rebound and no guarding.  Musculoskeletal: Normal range of motion. He exhibits no edema and no tenderness.  Moves all extremities, no bony abnormality or deformity  Neurological: He is alert and oriented to person, place, and time.  Skin: Skin is warm and dry.  Psychiatric: He has a normal mood and affect. His behavior is normal. Judgment and thought content normal.    ED Course  Procedures (including critical care time) Labs Review Labs Reviewed - No data to display  Imaging Review No results found.   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 Laboratory studies reviewed from earlier ED visit. No concerning findings. No evidence of rhabdo. Chest is tender to palpation. EKG is unchanged from previous. I will treat patient's acute pain here with percocet and discharge to home with NSAIDs. Discussed patient with Dr. Fayrene Fearing, who agrees with plan for NSAIDs and discharge.    Roxy Horseman, PA-C 07/15/14 1525

## 2014-07-15 NOTE — ED Provider Notes (Signed)
CSN: [id number]     Arrival date & time 07/15/14  4098 History   First MD Initiated Contact with Patient 07/15/14 1013     Chief Complaint  Patient presents with  . Generalized Body Aches     (Consider location/radiation/quality/duration/timing/severity/associated sxs/prior Treatment) The history is provided by the patient and a relative.   He complains of generalized myalgia, after doing heavy lifting at work yesterday. He, states he do this because "a machine was broken." He is unable to localize his pain. He denies fever, chills, nausea, vomiting, weakness, or dizziness. There are no other known modifying factors.  Past Medical History  Diagnosis Date  . Hypertension    No past surgical history on file. No family history on file. History  Substance Use Topics  . Smoking status: Current Every Day Smoker -- 2.00 packs/day    Types: Cigarettes  . Smokeless tobacco: Not on file  . Alcohol Use: Yes     Comment: heavy drinker    Review of Systems  All other systems reviewed and are negative.     Allergies  Review of patient's allergies indicates no known allergies.  Home Medications   Prior to Admission medications   Not on File   BP 129/84  Pulse 78  Temp(Src) 98.7 F (37.1 C) (Oral)  Resp 14  Ht  (1.676 m)  Wt 130 lb (58.968 kg)  BMI 20.99 kg/m2  SpO2 100% Physical Exam  Nursing note and vitals reviewed. Constitutional: He is oriented to person, place, and time. He appears well-developed and well-nourished.  HENT:  Head: Normocephalic and atraumatic.  Right Ear: External ear normal.  Left Ear: External ear normal.  Eyes: Conjunctivae and EOM are normal. Pupils are equal, round, and reactive to light.  Neck: Normal range of motion and phonation normal. Neck supple.  Cardiovascular: Normal rate, regular rhythm and normal heart sounds.   Pulmonary/Chest: Effort normal and breath sounds normal. He exhibits no bony tenderness.  Abdominal: Soft. There is  no tenderness.  Musculoskeletal: Normal range of motion.  He moves about on the stretcher slowly secondary to diffuse, muscular pain. There are no deformities or swelling of the large joints of either side.  Neurological: He is alert and oriented to person, place, and time. No cranial nerve deficit or sensory deficit. He exhibits normal muscle tone. Coordination normal.  Skin: Skin is warm, dry and intact. No rash noted. No erythema. No pallor.  Psychiatric: He has a normal mood and affect. His behavior is normal. Judgment and thought content normal.    ED Course  Procedures (including critical care time) Medications  ketorolac (TORADOL) 30 MG/ML injection 30 mg (not administered)  sodium chloride 0.9 % bolus 1,000 mL (1,000 mLs Intravenous New Bag/Given 07/15/14 1108)    Patient Vitals for the past 24 hrs:  BP Temp Temp src Pulse Resp SpO2 Height Weight  07/15/14 0950 129/84 mmHg 98.7 F (37.1 C) Oral 78 14 100 %  (1.676 m) 130 lb (58.968 kg)    The patient left AGAINST MEDICAL ADVICE, suddenly after treatment. He would not stay to talk to anyone.  Labs Review Labs Reviewed  CBC WITH DIFFERENTIAL - Abnormal; Notable for the following:    Neutrophils Relative % 35 (*)    Lymphocytes Relative 55 (*)    All other components within normal limits  BASIC METABOLIC PANEL  CK    Imaging Review No results found.   EKG Interpretation None      MDM  Final diagnoses:  Myalgia    Nonspecific myalgias, without evidence for rhabdomyolysis, metabolic instability, or SBI  Nursing Notes Reviewed/ Care Coordinated Applicable Imaging Reviewed Interpretation of Laboratory Data incorporated into ED treatment   Disposition: AMA    Flint Melter, MD 07/15/14 1554

## 2014-07-15 NOTE — ED Notes (Signed)
Pt given 30 toradol by EMS. EMS EKG unremarkable.

## 2014-07-15 NOTE — Discharge Instructions (Signed)
Muscle Pain  Muscle pain (myalgia) may be caused by many things, including:   Overuse or muscle strain, especially if you are not in shape. This is the most common cause of muscle pain.   Injury.   Bruises.   Viruses, such as the flu.   Infectious diseases.   Fibromyalgia, which is a chronic condition that causes muscle tenderness, fatigue, and headache.   Autoimmune diseases, including lupus.   Certain drugs, including ACE inhibitors and statins.  Muscle pain may be mild or severe. In most cases, the pain lasts only a short time and goes away without treatment. To diagnose the cause of your muscle pain, your health care provider will take your medical history. This means he or she will ask you when your muscle pain began and what has been happening. If you have not had muscle pain for very long, your health care provider may want to wait before doing much testing. If your muscle pain has lasted a long time, your health care provider may want to run tests right away. If your health care provider thinks your muscle pain may be caused by illness, you may need to have additional tests to rule out certain conditions.   Treatment for muscle pain depends on the cause. Home care is often enough to relieve muscle pain. Your health care provider may also prescribe anti-inflammatory medicine.  HOME CARE INSTRUCTIONS  Watch your condition for any changes. The following actions may help to lessen any discomfort you are feeling:   Only take over-the-counter or prescription medicines as directed by your health care provider.   Apply ice to the sore muscle:   Put ice in a plastic bag.   Place a towel between your skin and the bag.   Leave the ice on for 15-20 minutes, 3-4 times a day.   You may alternate applying hot and cold packs to the muscle as directed by your health care provider.   If overuse is causing your muscle pain, slow down your activities until the pain goes away.   Remember that it is normal to feel  some muscle pain after starting a workout program. Muscles that have not been used often will be sore at first.   Do regular, gentle exercises if you are not usually active.   Warm up before exercising to lower your risk of muscle pain.   Do not continue working out if the pain is very bad. Bad pain could mean you have injured a muscle.  SEEK MEDICAL CARE IF:   Your muscle pain gets worse, and medicines do not help.   You have muscle pain that lasts longer than 3 days.   You have a rash or fever along with muscle pain.   You have muscle pain after a tick bite.   You have muscle pain while working out, even though you are in good physical condition.   You have redness, soreness, or swelling along with muscle pain.   You have muscle pain after starting a new medicine or changing the dose of a medicine.  SEEK IMMEDIATE MEDICAL CARE IF:   You have trouble breathing.   You have trouble swallowing.   You have muscle pain along with a stiff neck, fever, and vomiting.   You have severe muscle weakness or cannot move part of your body.  MAKE SURE YOU:    Understand these instructions.   Will watch your condition.   Will get help right away if you are not   questions you have with your health care provider.    Emergency Department Resource Guide 1) Find a Doctor and Pay Out of Pocket Although you won't have to find out who is covered by your insurance plan, it is a good idea to ask around and get recommendations. You will then need to call the office and see if the doctor you have chosen will accept you as a new patient and what types of options they offer for patients who are self-pay. Some doctors offer discounts or will  set up payment plans for their patients who do not have insurance, but you will need to ask so you aren't surprised when you get to your appointment.  2) Contact Your Local Health Department Not all health departments have doctors that can see patients for sick visits, but many do, so it is worth a call to see if yours does. If you don't know where your local health department is, you can check in your phone book. The CDC also has a tool to help you locate your state's health department, and many state websites also have listings of all of their local health departments.  3) Find a Walk-in Clinic If your illness is not likely to be very severe or complicated, you may want to try a walk in clinic. These are popping up all over the country in pharmacies, drugstores, and shopping centers. They're usually staffed by nurse practitioners or physician assistants that have been trained to treat common illnesses and complaints. They're usually fairly quick and inexpensive. However, if you have serious medical issues or chronic medical problems, these are probably not your best option.  No Primary Care Doctor: - Call Health Connect at  [phone] - they can help you locate a primary care doctor that  accepts your insurance, provides certain services, etc. - Physician Referral Service- [phone]  Chronic Pain Problems: Organization         Address  Phone   Notes  [facility]  [phone] Patients need to be referred by their primary care doctor.   Medication Assistance: Organization         Address  Phone   Notes  [facility] Medication [facility] [address] [place] [place], [place] [address] [phone] --Must be a resident of [facility] -- Must have NO insurance coverage whatsoever (no Medicaid/ Medicare, etc.) -- The pt. MUST have a primary care doctor that directs their care regularly and follows them in the community   [facility]  [phone]    [employer]  [phone]    Agencies that provide inexpensive medical care: Organization         Address  Phone   Notes  [facility]  [phone]   [facility]    [phone]   [facility] [address] [place], [place] [address] [phone]   [facility] [address] [place] [place] [phone]   Planned Parenthood    [phone]   [facility]    [phone]   [facility]  [address], [place] Phone:  [phone], Fax:  [phone] Hours of Operation:  9 am - 6 pm, M-F.  Also accepts Medicaid/Medicare and self-pay.  [facility]  [address], [place] Phone: [phone], Fax: [phone]. Hours of Operation:  8:30 am - 5:30 pm,  M-F.  Also accepts Medicaid and self-pay.  [facility] [place] [address], [place] Phone: [phone]   [facility] [address] Natasha Bence [place], [place] [phone], Ext. [phone] Mondays & Thursdays: 7-9 AM.  First 15 patients are seen on a first come, first serve basis.    Medicaid-accepting [facility] Providers:  Organization         Address  Phone   Notes  [facility] [address], [place] [phone] Also accepts self-pay patients.  [facility] [address] Laurell Josephs [place], [place]  [phone]   [facility] [address], [place] [phone]   [facility] [address], [place] [phone]   Renaye Rakers [address], [place]   [phone] Only accepts [place] Access [place] patients after they have their name applied to their card.   Self-Pay (no insurance) in [facility]:  Organization         Address  Phone   Notes  Sickle Cell Patients, Dr [facility] [address] [place],  [place] [phone]   [facility] [address] [place], [place] [phone]   [facility] [place]  [address], [place] [phone]   [facility]/Dr. Osei-Bonsu  [address] [place] or [address], [place] [phone] Phone number for both [place] and [place] locations is the same.  [facility] [address] [place] [phone]   [facility] [address], [place] or [address] [phone]   [facility] [address], [place] [phone], phone; [phone], fax Sees patients 1st and 3rd Saturday of every month.  Must not qualify for public or private insurance (i.e. Medicaid, Medicare, [place] Health Choice, Veterans' Benefits)  Household income should be no more than 200% of the poverty level The clinic cannot treat you if you are pregnant or think you are pregnant  Sexually transmitted diseases are not treated at the clinic.    Dental Care: Organization         Address  Phone  Notes  [facility] Department of [facility] [address] [place], [place] [phone] Accepts children up to age 43 who are enrolled in [place] or [place] Health Choice; pregnant women with a Medicaid card; and children who have applied for Medicaid or [place] Health Choice, but were declined, whose parents can pay a reduced fee at time of service.  [facility]  [address], [place] [phone] Accepts children up to age 53 who are enrolled in [place] or [place] Health Choice; pregnant women with a Medicaid card; and children who have applied for Medicaid or [place] Health Choice, but were declined, whose parents can pay a reduced fee at time of service.  [facility]  [address] [place], [place] [phone] Patients are seen by appointment only. Walk-ins are not accepted. [facility] will see patients 52 years of age and older. Monday - Tuesday (8am-5pm) Most Wednesdays (8:30-5pm) $30 per visit, cash only  [facility] Adult Dental Access PROGRAM  [address], [place] [facility] [phone] Patients are seen by appointment only. Walk-ins are not accepted.  [facility] will see patients 33 years of age and older. One Wednesday Evening (Monthly: Volunteer Based).  $30 per visit, cash only  [employer]  [phone] for adults; Children under age 38, call Graduate Pediatric Dentistry at [phone]. Children aged 26-14, please call [phone] to request a pediatric application.  Dental services are provided in all areas of dental care including fillings, crowns and bridges, complete and partial dentures, implants, gum treatment, root canals, and extractions. Preventive care is also provided. Treatment is provided to both adults and children. Patients are selected via a lottery and there is often a waiting list.   [facility] [address] [place]  [phone] [email or web address]   [facility] [address] [place], [place] [phone], Ext. [phone] Second and Fourth Thursday of each month, opens at 6:30 AM; Clinic ends at 9 AM.  Patients are seen on a first-come first-served basis, and a limited number are seen during each clinic.   [facility]  [address] Ether Griffins [place], [place] [phone]   Eligibility Requirements You must have lived in [place], [place], or [place] counties for at least the last three months.   You cannot be eligible for state or federal sponsored [place], including [employer], [place], or [employer].   You generally cannot be eligible for healthcare insurance through your employer.    How to apply: Eligibility screenings are held every Tuesday and Wednesday afternoon  from 1:00 pm until 4:00 pm. You do not need an appointment for the interview!  [facility] [address], [place], [place] [phone]   [facility] Health Department  [phone]   [facility] Health Department  [phone]   [facility]  [phone]    Behavioral Health Resources in the Community: Intensive Outpatient Programs Organization         Address  Phone  Notes  [facility] [address] [place], [place] [phone]   [facility] Outpatient [address] [place], [place] [phone]   [facility] [address], [place], [place]  [phone]   [facility] Mental Health [address]  [place], [place] [phone] or [phone]   Substance Abuse Resources Organization         Address  Phone  Notes  [facility] Drug Services  7184990929   [facility]  [phone]   [facility] [place]  [phone]   [place]oydene Flock  [phone]   Residential & Outpatient Substance Abuse Program  [phone]   Psychological Services Organization         Address  Phone  Notes  [facility] Behavioral Health  [phone]   [facility]  [phone]   [facility] Mental Health [address] [place] [phone] or [phone]    Mobile Crisis Teams Organization         Address  Phone  Notes  [facility], Mobile Crisis Care Unit  [phone]   [facility]  [address] [place], [place] [phone]   Doristine Locks [address] [place] [phone]    Self-Help/Support Groups Organization         Address  Phone             Notes  [facility] [place] - variety of support groups  336- I7437963 Call for more information  Narcotics Anonymous (NA), [facility] [address], Arlean Hopping [place]  2 meetings at this location   Residential Treatment  Programs Organization         Address  Phone  Notes  [facility] [address],    [place]  [phone]   [facility]  [address] [place] [address], [place], [place]   [place], [place] [phone] Admissions: 8am-3pm M-F  Incentives Substance [place] [address]    [place], [place] [phone]   [facility] [address] [place], [place], [place]   [facility] [address]  [place], [place]   [facility] - Intensive Outpatient [address] Kristeen Mans [address], [place], [place]   [facility] [place]  [place], [place] [phone] or [phone]   [facility] ([facility]) [address] [place], [place] Accepts Medicaid  [facility] [place] [address]  [place] [phone] Substance Abuse/Addiction Treatment   [facility] Organization         Address  Phone  Notes  [facility]  [phone]   Domenic Schwab, PhD [address] Arlis Porta [place], [place]   4046771725 or [phone]   [place], [place] [phone]   [facility] [address], [place], [place] [phone] Insurance/Medicaid/sponsorship through [facility] Families [address]                                    [place], [place] [phone] [place] [address], [place] [phone]    Dr. Adele Schilder  [phone]   [facility] Dept. 1) [address] [place] 2) [place] 3)  [place] [address], [place] [phone]  [phone]   [place] [phone] or [phone] (After Hours)

## 2014-07-15 NOTE — ED Notes (Signed)
Pt was seen at [facility] this morning. Pt yesterday was moving a pile of bricks and pulled a muscle in his chest. Pt c/o CP. Pt states "they didn't do anything at the other place, nothing is different." Pain is worse with movement.

## 2014-07-15 NOTE — ED Notes (Signed)
Pt. Requesting food. Dr. Effie Shy gave ok. Given cheese, graham crackers, and peanut butter with a water.

## 2014-07-15 NOTE — ED Notes (Signed)
Per EMS: Pt states that he was doing some lifting yesterday that he is not used to.  Pt states that he is now "sore all over".

## 2014-07-21 NOTE — ED Provider Notes (Signed)
Medical screening examination/treatment/procedure(s) were performed by non-physician practitioner and as supervising physician I was immediately available for consultation/collaboration.   EKG Interpretation   Date/Time:  Wednesday July 15 2014 15:06:54 EDT Ventricular Rate:  67 PR Interval:  137 QRS Duration: 90 QT Interval:  392 QTC Calculation: 414 R Axis:   121 Text Interpretation:  Sinus rhythm Right axis deviation Nonspecific T  abnrm, anterolateral leads ST elev, probable normal early repol pattern  -Noted 02/2015 Confirmed by Fayrene Fearing  MD, Patrena Santalucia ([id number]) on 07/15/2014 3:14:55 PM        Rolland Porter, MD 07/21/14 1537

## 2014-11-16 NOTE — ED Provider Notes (Signed)
Medical screening examination/treatment/procedure(s) were conducted as a shared visit with non-physician practitioner(s) and myself.  I personally evaluated the patient during the encounter.   EKG Interpretation   Date/Time:  Wednesday July 15 2014 15:06:54 EDT Ventricular Rate:  67 PR Interval:  137 QRS Duration: 90 QT Interval:  392 QTC Calculation: 414 R Axis:   121 Text Interpretation:  Sinus rhythm Right axis deviation Nonspecific T  abnrm, anterolateral leads ST elev, probable normal early repol pattern  -Noted 02/2015 Confirmed by Fayrene FearingJAMES  MD, MARK ([id number]) on 07/15/2014 3:14:55 PM       Flint MelterElliott L Tenya Araque, MD 11/16/14 [phone]

## 2014-11-29 ENCOUNTER — Emergency Department (HOSPITAL_COMMUNITY)
Admission: EM | Admit: 2014-11-29 | Discharge: 2014-11-30 | Payer: Medicaid Other | Attending: Emergency Medicine | Admitting: Emergency Medicine

## 2014-11-29 ENCOUNTER — Encounter (HOSPITAL_COMMUNITY): Payer: Self-pay | Admitting: *Deleted

## 2014-11-29 DIAGNOSIS — R61 Generalized hyperhidrosis: Secondary | ICD-10-CM | POA: Insufficient documentation

## 2014-11-29 DIAGNOSIS — R079 Chest pain, unspecified: Secondary | ICD-10-CM | POA: Diagnosis present

## 2014-11-29 DIAGNOSIS — Z791 Long term (current) use of non-steroidal anti-inflammatories (NSAID): Secondary | ICD-10-CM | POA: Diagnosis not present

## 2014-11-29 DIAGNOSIS — I10 Essential (primary) hypertension: Secondary | ICD-10-CM | POA: Diagnosis not present

## 2014-11-29 DIAGNOSIS — Z720 Tobacco use: Secondary | ICD-10-CM | POA: Insufficient documentation

## 2014-11-29 DIAGNOSIS — Z8659 Personal history of other mental and behavioral disorders: Secondary | ICD-10-CM | POA: Insufficient documentation

## 2014-11-29 DIAGNOSIS — E876 Hypokalemia: Secondary | ICD-10-CM | POA: Insufficient documentation

## 2014-11-29 DIAGNOSIS — R0789 Other chest pain: Secondary | ICD-10-CM | POA: Diagnosis not present

## 2014-11-29 HISTORY — DX: Depression, unspecified: F32.A

## 2014-11-29 HISTORY — DX: Anxiety disorder, unspecified: F41.9

## 2014-11-29 HISTORY — DX: Major depressive disorder, single episode, unspecified: F32.9

## 2014-11-29 MED ORDER — KETOROLAC TROMETHAMINE 30 MG/ML IJ SOLN
30.0000 mg | Freq: Once | INTRAMUSCULAR | Status: AC
  Administered 2014-11-30: 30 mg via INTRAVENOUS
  Filled 2014-11-29: qty 1

## 2014-11-29 NOTE — ED Notes (Signed)
Dr. Glick at bedside.  

## 2014-11-29 NOTE — ED Provider Notes (Signed)
CSN: [id number]     Arrival date & time 11/29/14  2316 History  This chart was scribed for Dione Booze, MD by Murriel Hopper, ED Scribe. This patient was seen in room A04C/A04C and the patient's care was started at 11:47 PM.    Chief Complaint  Patient presents with  . Chest Pain    The history is provided by the patient. No language interpreter was used.     HPI Comments: Maxwell Faulkner is a 29 y.o. male who presents to the Emergency Department complaining of constant, sharp left-sided chest pain that he rates as 10/10 in severity with associated diaphoresis began an hour ago while he was arguing with police. Pt states that pain increases when he moves and with palpation to the area. Pt states that pain increases when he takes a deep breath as well. Pt states that he smokes a pack per day, and drinks a half gallon of liquor per day as well. Pt states that he has been on a medication that was given to him from behavioral health for about a month, and states that he is out of his medication. Pt denies nausea.   Past Medical History  Diagnosis Date  . Hypertension   . Anxiety   . Depression    History reviewed. No pertinent past surgical history. History reviewed. No pertinent family history. History  Substance Use Topics  . Smoking status: Current Every Day Smoker -- 2.00 packs/day    Types: Cigarettes  . Smokeless tobacco: Not on file  . Alcohol Use: Yes     Comment: heavy drinker    Review of Systems  Constitutional: Positive for diaphoresis.  Cardiovascular: Positive for chest pain.  Gastrointestinal: Negative for nausea.      Allergies  Review of patient's allergies indicates no known allergies.  Home Medications   Prior to Admission medications   Medication Sig Start Date End Date Taking? Authorizing Provider  ibuprofen (ADVIL,MOTRIN) 800 MG tablet Take 1 tablet (800 mg total) by mouth 3 (three) times daily. 07/15/14   Roxy Horseman, PA-C   BP 135/81 mmHg  Pulse  63  Temp(Src) 97.4 F (36.3 C) (Oral)  Resp 18  Ht  (1.676 m)  Wt 120 lb (54.432 kg)  BMI 19.38 kg/m2  SpO2 100% Physical Exam  Constitutional: He is oriented to person, place, and time. He appears well-developed and well-nourished.  HENT:  Head: Normocephalic and atraumatic.  Eyes: Pupils are equal, round, and reactive to light.  Neck: Normal range of motion. Neck supple. No JVD present.  Cardiovascular: Normal rate, regular rhythm and normal heart sounds.   No murmur heard. Pulmonary/Chest: Effort normal and breath sounds normal. No respiratory distress. He has no wheezes. He has no rales.  Mild tenderness upper left anterior chest wall  Abdominal: Bowel sounds are normal. He exhibits no distension and no mass. There is no tenderness.  Musculoskeletal: Normal range of motion.  Lymphadenopathy:    He has no cervical adenopathy.  Neurological: He is alert and oriented to person, place, and time. No cranial nerve deficit. Coordination normal.  Skin: Skin is warm and dry. No rash noted.  Psychiatric: He has a normal mood and affect. His behavior is normal.  Nursing note and vitals reviewed.   ED Course  Procedures (including critical care time)  DIAGNOSTIC STUDIES: Oxygen Saturation is 100% on RA, normal by my interpretation.    COORDINATION OF CARE: 11:53 PM Discussed treatment plan with pt at bedside and pt agreed  to plan.   Labs Review Results for orders placed or performed during the hospital encounter of 11/29/14  Basic metabolic panel  Result Value Ref Range   Sodium 138 135 - 145 mmol/L   Potassium 3.0 (L) 3.5 - 5.1 mmol/L   Chloride 102 96 - 112 mmol/L   CO2 31 19 - 32 mmol/L   Glucose, Bld 93 70 - 99 mg/dL   BUN 7 6 - 23 mg/dL   Creatinine, Ser 9.600.88 0.50 - 1.35 mg/dL   Calcium 9.4 8.4 - 45.410.5 mg/dL   GFR calc non Af Amer >90 >90 mL/min   GFR calc Af Amer >90 >90 mL/min   Anion gap 5 5 - 15  CBC with Differential  Result Value Ref Range   WBC 4.7 4.0 -  10.5 K/uL   RBC 4.40 4.22 - 5.81 MIL/uL   Hemoglobin 13.5 13.0 - 17.0 g/dL   HCT 09.840.4 11.939.0 - 14.752.0 %   MCV 91.8 78.0 - 100.0 fL   MCH 30.7 26.0 - 34.0 pg   MCHC 33.4 30.0 - 36.0 g/dL   RDW 82.912.4 56.211.5 - 13.015.5 %   Platelets 132 (L) 150 - 400 K/uL   Neutrophils Relative % 41 (L) 43 - 77 %   Neutro Abs 1.9 1.7 - 7.7 K/uL   Lymphocytes Relative 45 12 - 46 %   Lymphs Abs 2.2 0.7 - 4.0 K/uL   Monocytes Relative 9 3 - 12 %   Monocytes Absolute 0.4 0.1 - 1.0 K/uL   Eosinophils Relative 4 0 - 5 %   Eosinophils Absolute 0.2 0.0 - 0.7 K/uL   Basophils Relative 1 0 - 1 %   Basophils Absolute 0.0 0.0 - 0.1 K/uL   Imaging Review Dg Chest 2 View  11/30/2014   CLINICAL DATA:  Left-sided chest pain.  EXAM: CHEST  2 VIEW  COMPARISON:  Chest CT 03/04/2014  FINDINGS: Apical blebs, right greater than left, similar to prior CT. There is no pneumothorax. No parenchymal consolidation. Cardiomediastinal contours are normal. There is no pleural effusion. No acute osseous abnormalities.  IMPRESSION: No acute pulmonary process.  Biapical blebs, similar to prior CT.   Electronically Signed   By: Rubye OaksMelanie  Ehinger M.D.   On: 11/30/2014 01:33     EKG Interpretation   Date/Time:  Sunday November 29 2014 23:20:18 EST Ventricular Rate:  53 PR Interval:  148 QRS Duration: 82 QT Interval:  404 QTC Calculation: 379 R Axis:   105 Text Interpretation:  Sinus bradycardia Rightward axis Abnormal ECG Early  repolarization When compared with ECG of 07/15/2014, No significant change  was found Confirmed by [facility]  MD, Kishawn Pickar ([id number]) on 11/29/2014 11:38:05 PM      MDM   Final diagnoses:  Chest pain, unspecified chest pain type  Hypokalemia    Chest pain most consistent with chest wall pain. I have discussed with police and there was some force used in the rest but not excessive. No physical findings to suggest significant trauma. He is given a dose of toradol and x-ray obtained.  Chest x-ray shows no acute process.  Potassium is come back 3.0 and is given oral potassium in the ED. I reviewed his record on the [employer] a controlled substance reporting website and found no reportable prescriptions on file. I do not know what his psychiatric medications are. He is given a single dose of lorazepam in the ED. His discharged with prescriptions for naproxen and K-Dur. He is discharged in police custody and  is going to jail. They will need to contact his mental health provider in the morning to find out what his medications are so that he can be continued on them while in jail.  I personally performed the services described in this documentation, which was scribed in my presence. The recorded information has been reviewed and is accurate.     Dione Booze, MD 11/30/14 [phone]

## 2014-11-29 NOTE — ED Notes (Signed)
Patient presents stating his chest hurts "all over" since the cops were "whooping his ass"

## 2014-11-30 ENCOUNTER — Emergency Department (HOSPITAL_COMMUNITY): Payer: Medicaid Other

## 2014-11-30 LAB — CBC WITH DIFFERENTIAL/PLATELET
Basophils Absolute: 0 10*3/uL (ref 0.0–0.1)
Basophils Relative: 1 % (ref 0–1)
EOS PCT: 4 % (ref 0–5)
Eosinophils Absolute: 0.2 10*3/uL (ref 0.0–0.7)
HCT: 40.4 % (ref 39.0–52.0)
HEMOGLOBIN: 13.5 g/dL (ref 13.0–17.0)
LYMPHS PCT: 45 % (ref 12–46)
Lymphs Abs: 2.2 10*3/uL (ref 0.7–4.0)
MCH: 30.7 pg (ref 26.0–34.0)
MCHC: 33.4 g/dL (ref 30.0–36.0)
MCV: 91.8 fL (ref 78.0–100.0)
MONO ABS: 0.4 10*3/uL (ref 0.1–1.0)
Monocytes Relative: 9 % (ref 3–12)
NEUTROS ABS: 1.9 10*3/uL (ref 1.7–7.7)
Neutrophils Relative %: 41 % — ABNORMAL LOW (ref 43–77)
Platelets: 132 10*3/uL — ABNORMAL LOW (ref 150–400)
RBC: 4.4 MIL/uL (ref 4.22–5.81)
RDW: 12.4 % (ref 11.5–15.5)
WBC: 4.7 10*3/uL (ref 4.0–10.5)

## 2014-11-30 LAB — BASIC METABOLIC PANEL
Anion gap: 5 (ref 5–15)
BUN: 7 mg/dL (ref 6–23)
CHLORIDE: 102 mmol/L (ref 96–112)
CO2: 31 mmol/L (ref 19–32)
CREATININE: 0.88 mg/dL (ref 0.50–1.35)
Calcium: 9.4 mg/dL (ref 8.4–10.5)
GFR calc non Af Amer: >90 mL/min (ref >90)
GLUCOSE: 93 mg/dL (ref 70–99)
POTASSIUM: 3 mmol/L — AB (ref 3.5–5.1)
SODIUM: 138 mmol/L (ref 135–145)

## 2014-11-30 MED ORDER — LORAZEPAM 1 MG PO TABS
1.0000 mg | ORAL_TABLET | Freq: Once | ORAL | Status: AC
  Administered 2014-11-30: 1 mg via ORAL
  Filled 2014-11-30: qty 1

## 2014-11-30 MED ORDER — NAPROXEN 500 MG PO TABS: 500.0000 mg | ORAL_TABLET | Freq: Two times a day (BID) | ORAL | Status: DC

## 2014-11-30 MED ORDER — POTASSIUM CHLORIDE CRYS ER 20 MEQ PO TBCR
40.0000 meq | EXTENDED_RELEASE_TABLET | Freq: Once | ORAL | Status: AC
  Administered 2014-11-30: 40 meq via ORAL
  Filled 2014-11-30: qty 2

## 2014-11-30 MED ORDER — POTASSIUM CHLORIDE CRYS ER 20 MEQ PO TBCR: 20.0000 meq | EXTENDED_RELEASE_TABLET | Freq: Two times a day (BID) | ORAL | Status: DC

## 2014-11-30 NOTE — Discharge Instructions (Signed)
Chest Wall Pain °Chest wall pain is pain in or around the bones and muscles of your chest. It may take up to 6 weeks to get better. It may take longer if you must stay physically active in your work and activities.  °CAUSES  °Chest wall pain may happen on its own. However, it may be caused by: °· A viral illness like the flu. °· Injury. °· Coughing. °· Exercise. °· Arthritis. °· Fibromyalgia. °· Shingles. °HOME CARE INSTRUCTIONS  °· Avoid overtiring physical activity. Try not to strain or perform activities that cause pain. This includes any activities using your chest or your abdominal and side muscles, especially if heavy weights are used. °· Put ice on the sore area. °¨ Put ice in a plastic bag. °¨ Place a towel between your skin and the bag. °¨ Leave the ice on for 15-20 minutes per hour while awake for the first 2 days. °· Only take over-the-counter or prescription medicines for pain, discomfort, or fever as directed by your caregiver. °SEEK IMMEDIATE MEDICAL CARE IF:  °· Your pain increases, or you are very uncomfortable. °· You have a fever. °· Your chest pain becomes worse. °· You have new, unexplained symptoms. °· You have nausea or vomiting. °· You feel sweaty or lightheaded. °· You have a cough with phlegm (sputum), or you cough up blood. °MAKE SURE YOU:  °· Understand these instructions. °· Will watch your condition. °· Will get help right away if you are not doing well or get worse. °Document Released: 10/16/2005 Document Revised: 01/08/2012 Document Reviewed: 06/12/2011 °Exit[employer]® Patient Information ©2015 [employer]. This information is not intended to replace advice given to you by your health care provider. Make sure you discuss any questions you have with your health care provider. ° °Naproxen and naproxen sodium oral immediate-release tablets °What is this medicine? °NAPROXEN (na PROX en) is a non-steroidal anti-inflammatory drug (NSAID). It is used to reduce swelling and to treat pain. This  medicine may be used for dental pain, headache, or painful monthly periods. It is also used for painful joint and muscular problems such as arthritis, tendinitis, bursitis, and gout. °This medicine may be used for other purposes; ask your health care provider or pharmacist if you have questions. °COMMON BRAND NAME(S): Aflaxen, Aleve, Aleve Arthritis, All Day Relief, Anaprox, Anaprox DS, Naprosyn °What should I tell my health care provider before I take this medicine? °They need to know if you have any of these conditions: °-asthma °-cigarette smoker °-drink more than 3 alcohol containing drinks a day °-heart disease or circulation problems such as heart failure or leg edema (fluid retention) °-high blood pressure °-kidney disease °-liver disease °-stomach bleeding or ulcers °-an unusual or allergic reaction to naproxen, aspirin, other NSAIDs, other medicines, foods, dyes, or preservatives °-pregnant or trying to get pregnant °-breast-feeding °How should I use this medicine? °Take this medicine by mouth with a glass of water. Follow the directions on the prescription label. Take it with food if your stomach gets upset. Try to not lie down for at least 10 minutes after you take it. Take your medicine at regular intervals. Do not take your medicine more often than directed. Long-term, continuous use may increase the risk of heart attack or stroke. °A special MedGuide will be given to you by the pharmacist with each prescription and refill. Be sure to read this information carefully each time. °Talk to your pediatrician regarding the use of this medicine in children. Special care may be needed. °Overdosage:   If you think you have taken too much of this medicine contact a poison control center or emergency room at once. °NOTE: This medicine is only for you. Do not share this medicine with others. °What if I miss a dose? °If you miss a dose, take it as soon as you can. If it is almost time for your next dose, take only  that dose. Do not take double or extra doses. °What may interact with this medicine? °-alcohol °-aspirin °-cidofovir °-diuretics °-lithium °-methotrexate °-other drugs for inflammation like ketorolac or prednisone °-pemetrexed °-probenecid °-warfarin °This list may not describe all possible interactions. Give your health care provider a list of all the medicines, herbs, non-prescription drugs, or dietary supplements you use. Also tell them if you smoke, drink alcohol, or use illegal drugs. Some items may interact with your medicine. °What should I watch for while using this medicine? °Tell your doctor or health care professional if your pain does not get better. Talk to your doctor before taking another medicine for pain. Do not treat yourself. °This medicine does not prevent heart attack or stroke. In fact, this medicine may increase the chance of a heart attack or stroke. The chance may increase with longer use of this medicine and in people who have heart disease. If you take aspirin to prevent heart attack or stroke, talk with your doctor or health care professional. °Do not take other medicines that contain aspirin, ibuprofen, or naproxen with this medicine. Side effects such as stomach upset, nausea, or ulcers may be more likely to occur. Many medicines available without a prescription should not be taken with this medicine. °This medicine can cause ulcers and bleeding in the stomach and intestines at any time during treatment. Do not smoke cigarettes or drink alcohol. These increase irritation to your stomach and can make it more susceptible to damage from this medicine. Ulcers and bleeding can happen without warning symptoms and can cause death. °You may get drowsy or dizzy. Do not drive, use machinery, or do anything that needs mental alertness until you know how this medicine affects you. Do not stand or sit up quickly, especially if you are an older patient. This reduces the risk of dizzy or fainting  spells. °This medicine can cause you to bleed more easily. Try to avoid damage to your teeth and gums when you brush or floss your teeth. °What side effects may I notice from receiving this medicine? °Side effects that you should report to your doctor or health care professional as soon as possible: °-black or bloody stools, blood in the urine or vomit °-blurred vision °-chest pain °-difficulty breathing or wheezing °-nausea or vomiting °-severe stomach pain °-skin rash, skin redness, blistering or peeling skin, hives, or itching °-slurred speech or weakness on one side of the body °-swelling of eyelids, throat, lips °-unexplained weight gain or swelling °-unusually weak or tired °-yellowing of eyes or skin °Side effects that usually do not require medical attention (report to your doctor or health care professional if they continue or are bothersome): °-constipation °-headache °-heartburn °This list may not describe all possible side effects. Call your doctor for medical advice about side effects. You may report side effects to FDA at 1-800-FDA-1088. °Where should I keep my medicine? °Keep out of the reach of children. °Store at room temperature between 15 and 30 degrees C (59 and 86 degrees F). Keep container tightly closed. Throw away any unused medicine after the expiration date. °NOTE: This sheet is a summary. It   may not cover all possible information. If you have questions about this medicine, talk to your doctor, pharmacist, or health care provider.  2015, [employer] Standard. (2009-10-18 20:10:16)  Potassium Salts tablets, extended-release tablets or capsules What is this medicine? POTASSIUM (poe TASS i um) is a natural salt that is important for the heart, muscles, and nerves. It is found in many foods and is normally supplied by a well balanced diet. This medicine is used to treat low potassium. This medicine may be used for other purposes; ask your health care provider or pharmacist if you have  questions. COMMON BRAND NAME(S): ED-K+10, Glu-K, K-10, K-8, K-Dur, K-Tab, Kaon-CL, Klor-Con, Klor-Con M10, Klor-Con M15, Klor-Con M20, Klotrix, Micro-K, Micro-K Extencaps, Slow-K What should I tell my health care provider before I take this medicine? They need to know if you have any of these conditions: -dehydration -diabetes -irregular heartbeat -kidney disease -stomach ulcers or other stomach problems -an unusual or allergic reaction to potassium salts, other medicines, foods, dyes, or preservatives -pregnant or trying to get pregnant -breast-feeding How should I use this medicine? Take this medicine by mouth with a full glass of water. Follow the directions on the prescription label. Take with food. Do not suck on, crush, or chew this medicine. If you have difficulty swallowing, ask the pharmacist how to take. Take your medicine at regular intervals. Do not take it more often than directed. Do not stop taking except on your doctor's advice. Talk to your pediatrician regarding the use of this medicine in children. Special care may be needed. Overdosage: If you think you have taken too much of this medicine contact a poison control center or emergency room at once. NOTE: This medicine is only for you. Do not share this medicine with others. What if I miss a dose? If you miss a dose, take it as soon as you can. If it is almost time for your next dose, take only that dose. Do not take double or extra doses. What may interact with this medicine? Do not take this medicine with any of the following medications: -eplerenone -sodium polystyrene sulfonate This medicine may also interact with the following medications: -medicines for blood pressure or heart disease like lisinopril, losartan, quinapril, valsartan -medicines for cold or allergies -medicines for inflammation like ibuprofen, indomethacin -medicines for Parkinson's disease -medicines for the stomach like metoclopramide, dicyclomine,  glycopyrrolate -some diuretics This list may not describe all possible interactions. Give your health care provider a list of all the medicines, herbs, non-prescription drugs, or dietary supplements you use. Also tell them if you smoke, drink alcohol, or use illegal drugs. Some items may interact with your medicine. What should I watch for while using this medicine? Visit your doctor or health care professional for regular check ups. You will need lab work done regularly. You may need to be on a special diet while taking this medicine. Ask your doctor. What side effects may I notice from receiving this medicine? Side effects that you should report to your doctor or health care professional as soon as possible: -allergic reactions like skin rash, itching or hives, swelling of the face, lips, or tongue -black, tarry stools -heartburn -irregular heartbeat -numbness or tingling in hands or feet -pain when swallowing -unusually weak or tired Side effects that usually do not require medical attention (report to your doctor or health care professional if they continue or are bothersome): -diarrhea -nausea -stomach gas -vomiting This list may not describe all possible side effects. Call your doctor  for medical advice about side effects. You may report side effects to FDA at 1-800-FDA-1088. Where should I keep my medicine? Keep out of the reach of children. Store at room temperature between 15 and 30 degrees C (59 and 86 degrees F ). Keep bottle closed tightly to protect this medicine from light and moisture. Throw away any unused medicine after the expiration date. NOTE: This sheet is a summary. It may not cover all possible information. If you have questions about this medicine, talk to your doctor, pharmacist, or health care provider.  2015, [employer] Standard. (2008-01-01 11:17:31)

## 2014-11-30 NOTE — ED Notes (Addendum)
Pt A&OX4, ambulatory at d/c with steady gait in handcuffs, NAD, in police custody.

## 2017-11-16 ENCOUNTER — Encounter (HOSPITAL_COMMUNITY): Payer: Self-pay | Admitting: *Deleted

## 2017-11-16 ENCOUNTER — Emergency Department (HOSPITAL_COMMUNITY)
Admission: EM | Admit: 2017-11-16 | Discharge: 2017-11-16 | Disposition: A | Discharge status: Home / Self Care | Payer: Medicaid Other | Attending: Emergency Medicine | Admitting: Emergency Medicine

## 2017-11-16 ENCOUNTER — Other Ambulatory Visit: Payer: Self-pay

## 2017-11-16 ENCOUNTER — Emergency Department (HOSPITAL_COMMUNITY): Payer: Medicaid Other

## 2017-11-16 DIAGNOSIS — Y929 Unspecified place or not applicable: Secondary | ICD-10-CM | POA: Insufficient documentation

## 2017-11-16 DIAGNOSIS — S20219A Contusion of unspecified front wall of thorax, initial encounter: Secondary | ICD-10-CM | POA: Insufficient documentation

## 2017-11-16 DIAGNOSIS — Y990 Civilian activity done for income or pay: Secondary | ICD-10-CM | POA: Insufficient documentation

## 2017-11-16 DIAGNOSIS — W1789XA Other fall from one level to another, initial encounter: Secondary | ICD-10-CM | POA: Diagnosis not present

## 2017-11-16 DIAGNOSIS — Z79899 Other long term (current) drug therapy: Secondary | ICD-10-CM | POA: Diagnosis not present

## 2017-11-16 DIAGNOSIS — F17210 Nicotine dependence, cigarettes, uncomplicated: Secondary | ICD-10-CM | POA: Insufficient documentation

## 2017-11-16 DIAGNOSIS — I10 Essential (primary) hypertension: Secondary | ICD-10-CM | POA: Diagnosis not present

## 2017-11-16 DIAGNOSIS — Y939 Activity, unspecified: Secondary | ICD-10-CM | POA: Diagnosis not present

## 2017-11-16 DIAGNOSIS — S299XXA Unspecified injury of thorax, initial encounter: Secondary | ICD-10-CM | POA: Diagnosis present

## 2017-11-16 MED ORDER — KETOROLAC TROMETHAMINE 15 MG/ML IJ SOLN
30.0000 mg | Freq: Once | INTRAMUSCULAR | Status: AC
  Administered 2017-11-16: 30 mg via INTRAMUSCULAR
  Filled 2017-11-16: qty 2

## 2017-11-16 MED ORDER — IBUPROFEN 600 MG PO TABS: 600.0000 mg | ORAL_TABLET | Freq: Three times a day (TID) | ORAL | 0 refills | Status: DC | PRN

## 2017-11-16 NOTE — ED Triage Notes (Addendum)
Pt states started having leg and arm pains bilaterally, stomach hurts, ribs hurt, and hard to breath for the last 2 days. No injuries, no fever. Pt reports he slipped and fell off a mulch machine one week ago.

## 2017-11-16 NOTE — ED Provider Notes (Signed)
[facility]   CSN: [id number] Arrival date & time: 11/16/17  0705     History   Chief Complaint Chief Complaint  Patient presents with  . Shortness of Breath  . Leg Pain    HPI Maxwell Faulkner is a 32 y.o. male.  32 year old male with prior history of anxiety, depression, and hypertension presents with complaint of anterior chest wall pain.  Patient reportedly fell off a grinder machine 3 days previously.  This injury occurred at work.  Patient fell approximately 5-6 feet onto hard ground.  He impacted onto his front.  After the fall he started to experience chest and anterior abdominal pain.  The pain has been persistent since the fall.  He denies any other injury.  He is ambulatory without difficulty.  He did not lose consciousness.  He denies neck pain.  He has not taken anything at home except acetaminophen for his pain.   The history is provided by the patient.  Fall  This is a new problem. The current episode started more than 2 days ago. The problem occurs rarely. The problem has not changed since onset.Associated symptoms include chest pain and abdominal pain. The symptoms are aggravated by twisting and standing. The symptoms are relieved by rest. He has tried acetaminophen for the symptoms. The treatment provided no relief.    Past Medical History:  Diagnosis Date  . Anxiety   . Depression   . Hypertension     There are no active problems to display for this patient.   History reviewed. No pertinent surgical history.     Home Medications    Prior to Admission medications   Medication Sig Start Date End Date Taking? Authorizing Provider  naproxen (NAPROSYN) 500 MG tablet Take 1 tablet (500 mg total) by mouth 2 (two) times daily. 11/30/14   Dione BoozeGlick, David, MD  potassium chloride SA (K-DUR,KLOR-CON) 20 MEQ tablet Take 1 tablet (20 mEq total) by mouth 2 (two) times daily. 11/30/14   Dione BoozeGlick, David, MD    Family  History No family history on file.  Social History Social History   Tobacco Use  . Smoking status: Current Every Day Smoker    Packs/day: 2.00    Types: Cigarettes  . Smokeless tobacco: Never Used  Substance Use Topics  . Alcohol use: No    Frequency: Never    Comment: stopped drinking  . Drug use: Yes    Types: Marijuana     Allergies   Patient has no known allergies.   Review of Systems Review of Systems  Cardiovascular: Positive for chest pain.  Gastrointestinal: Positive for abdominal pain.  All other systems reviewed and are negative.    Physical Exam Updated Vital Signs BP (!) 133/96 (BP Location: Right Arm)   Pulse 83   Temp 98 F (36.7 C) (Oral)   Resp 18   SpO2 100%   Physical Exam  Constitutional: He is oriented to person, place, and time. He appears well-developed and well-nourished. No distress.  HENT:  Head: Normocephalic and atraumatic.  Mouth/Throat: Oropharynx is clear and moist.  Eyes: Conjunctivae and EOM are normal. Pupils are equal, round, and reactive to light.  Neck: Normal range of motion. Neck supple.  Cardiovascular: Normal rate, regular rhythm and normal heart sounds.  Pulmonary/Chest: Effort normal and breath sounds normal. No respiratory distress. He exhibits tenderness.  Diffuse anterior chest wall pain which reproduces the patient's symptoms.  This tenderness extends into the upper abdomen.  Abdominal: Soft. He exhibits no distension. There is no tenderness.  Musculoskeletal: Normal range of motion. He exhibits no edema or deformity.  Neurological: He is alert and oriented to person, place, and time.  Skin: Skin is warm and dry.  Psychiatric: He has a normal mood and affect.  Nursing note and vitals reviewed.    ED Treatments / Results  Labs (all labs ordered are listed, but only abnormal results are displayed) Labs Reviewed - No data to display  EKG  EKG Interpretation  Date/Time:  Friday November 16 2017 07:15:59  EST Ventricular Rate:  84 PR Interval:  140 QRS Duration: 82 QT Interval:  356 QTC Calculation: 420 R Axis:   102 Text Interpretation:  Normal sinus rhythm Rightward axis Borderline ECG Confirmed by Kristine Royal [phone]) on 11/16/2017 7:59:58 AM       Radiology Dg Chest 2 View  Result Date: 11/16/2017 CLINICAL DATA:  Patient status post fall. Anterior right and left-sided chest pain. Shortness of breath. EXAM: CHEST  2 VIEW COMPARISON:  Chest radiograph 11/30/2014. FINDINGS: Normal cardiac and mediastinal contours. No consolidative pulmonary opacities. Biapical bullous change. No pleural effusion or pneumothorax. Thoracic spine degenerative changes. IMPRESSION: No acute cardiopulmonary process. Electronically Signed   By: Annia Belt M.D.   On: 11/16/2017 08:00    Procedures Procedures (including critical care time)  Medications Ordered in ED Medications  ketorolac (TORADOL) 15 MG/ML injection 30 mg (not administered)     Initial Impression / Assessment and Plan / ED Course  I have reviewed the triage vital signs and the nursing notes.  Pertinent labs & imaging results that were available during my care of the patient were reviewed by me and considered in my medical decision making (see chart for details).     MDM screen complete  Patient's presentation consistent with chest wall contusion following a fall from heavy equipment.  Patient without evidence of significant acute traumatic injury.  Screening EKG and chest x-ray do not suggest otherwise.  Patient feels improved following Toradol injection.  Patient understands the plan of care for home symptomatic treatment.  Close follow-up is advised.  Strict return precautions given and understood.  Final Clinical Impressions(s) / ED Diagnoses   Final diagnoses:  Contusion of chest wall, unspecified laterality, initial encounter    ED Discharge Orders        Ordered    ibuprofen (ADVIL,MOTRIN) 600 MG tablet  Every 8 hours  PRN     11/16/17 0846       Wynetta Fines, MD 11/16/17 [phone]

## 2018-02-15 ENCOUNTER — Encounter (HOSPITAL_COMMUNITY): Payer: Self-pay

## 2018-02-15 ENCOUNTER — Emergency Department (HOSPITAL_COMMUNITY): Payer: Medicaid Other

## 2018-02-15 ENCOUNTER — Emergency Department (HOSPITAL_COMMUNITY)
Admission: EM | Admit: 2018-02-15 | Discharge: 2018-02-15 | Disposition: A | Discharge status: Home / Self Care | Payer: Medicaid Other | Attending: Emergency Medicine | Admitting: Emergency Medicine

## 2018-02-15 DIAGNOSIS — R1084 Generalized abdominal pain: Secondary | ICD-10-CM | POA: Diagnosis present

## 2018-02-15 DIAGNOSIS — R0789 Other chest pain: Secondary | ICD-10-CM | POA: Diagnosis not present

## 2018-02-15 DIAGNOSIS — I10 Essential (primary) hypertension: Secondary | ICD-10-CM | POA: Insufficient documentation

## 2018-02-15 DIAGNOSIS — F17210 Nicotine dependence, cigarettes, uncomplicated: Secondary | ICD-10-CM | POA: Diagnosis not present

## 2018-02-15 DIAGNOSIS — N200 Calculus of kidney: Secondary | ICD-10-CM | POA: Insufficient documentation

## 2018-02-15 LAB — CBC
HCT: 38.7 % — ABNORMAL LOW (ref 39.0–52.0)
Hemoglobin: 12.8 g/dL — ABNORMAL LOW (ref 13.0–17.0)
MCH: 29.4 pg (ref 26.0–34.0)
MCHC: 33.1 g/dL (ref 30.0–36.0)
MCV: 88.8 fL (ref 78.0–100.0)
Platelets: 208 10*3/uL (ref 150–400)
RBC: 4.36 MIL/uL (ref 4.22–5.81)
RDW: 13.2 % (ref 11.5–15.5)
WBC: 10.8 10*3/uL — ABNORMAL HIGH (ref 4.0–10.5)

## 2018-02-15 LAB — COMPREHENSIVE METABOLIC PANEL
ALK PHOS: 59 U/L (ref 38–126)
ALT: 8 U/L — AB (ref 17–63)
AST: 18 U/L (ref 15–41)
Albumin: 4.1 g/dL (ref 3.5–5.0)
Anion gap: 11 (ref 5–15)
BILIRUBIN TOTAL: 1.1 mg/dL (ref 0.3–1.2)
BUN: 8 mg/dL (ref 6–20)
CALCIUM: 9.4 mg/dL (ref 8.9–10.3)
CO2: 23 mmol/L (ref 22–32)
CREATININE: 1.02 mg/dL (ref 0.61–1.24)
Chloride: 106 mmol/L (ref 101–111)
GFR calc Af Amer: >60 mL/min (ref >60)
GFR calc non Af Amer: >60 mL/min (ref >60)
GLUCOSE: 125 mg/dL — AB (ref 65–99)
Potassium: 3.3 mmol/L — ABNORMAL LOW (ref 3.5–5.1)
SODIUM: 140 mmol/L (ref 135–145)
TOTAL PROTEIN: 7 g/dL (ref 6.5–8.1)

## 2018-02-15 LAB — URINALYSIS, ROUTINE W REFLEX MICROSCOPIC
Bacteria, UA: NONE SEEN
Bilirubin Urine: NEGATIVE
Glucose, UA: NEGATIVE mg/dL
KETONES UR: 80 mg/dL — AB
Leukocytes, UA: NEGATIVE
Nitrite: NEGATIVE
Protein, ur: NEGATIVE mg/dL
Specific Gravity, Urine: 1.02 (ref 1.005–1.030)
Squamous Epithelial / LPF: NONE SEEN
pH: 7 (ref 5.0–8.0)

## 2018-02-15 LAB — LIPASE, BLOOD: Lipase: 24 U/L (ref 11–51)

## 2018-02-15 MED ORDER — ONDANSETRON HCL 4 MG/2ML IJ SOLN
4.0000 mg | Freq: Once | INTRAMUSCULAR | Status: AC
  Administered 2018-02-15: 4 mg via INTRAVENOUS
  Filled 2018-02-15: qty 2

## 2018-02-15 MED ORDER — IBUPROFEN 800 MG PO TABS: 800.0000 mg | ORAL_TABLET | Freq: Three times a day (TID) | ORAL | 0 refills | Status: DC | PRN

## 2018-02-15 MED ORDER — KETOROLAC TROMETHAMINE 30 MG/ML IJ SOLN
30.0000 mg | Freq: Once | INTRAMUSCULAR | Status: AC
  Administered 2018-02-15: 30 mg via INTRAVENOUS
  Filled 2018-02-15: qty 1

## 2018-02-15 MED ORDER — IOPAMIDOL (ISOVUE-300) INJECTION 61%
100.0000 mL | Freq: Once | INTRAVENOUS | Status: AC | PRN
  Administered 2018-02-15: 100 mL via INTRAVENOUS

## 2018-02-15 MED ORDER — ONDANSETRON 4 MG PO TBDP
4.0000 mg | ORAL_TABLET | Freq: Once | ORAL | Status: AC | PRN
  Administered 2018-02-15: 4 mg via ORAL
  Filled 2018-02-15: qty 1

## 2018-02-15 MED ORDER — MORPHINE SULFATE (PF) 4 MG/ML IV SOLN
4.0000 mg | Freq: Once | INTRAVENOUS | Status: AC
  Administered 2018-02-15: 4 mg via INTRAVENOUS
  Filled 2018-02-15: qty 1

## 2018-02-15 MED ORDER — HYDROCODONE-ACETAMINOPHEN 5-325 MG PO TABS: 1.0000 | ORAL_TABLET | Freq: Four times a day (QID) | ORAL | 0 refills | Status: DC | PRN

## 2018-02-15 MED ORDER — IOPAMIDOL (ISOVUE-300) INJECTION 61%
INTRAVENOUS | Status: AC
  Filled 2018-02-15: qty 100

## 2018-02-15 MED ORDER — SODIUM CHLORIDE 0.9 % IV BOLUS
1000.0000 mL | Freq: Once | INTRAVENOUS | Status: AC
  Administered 2018-02-15: 1000 mL via INTRAVENOUS

## 2018-02-15 MED ORDER — TAMSULOSIN HCL 0.4 MG PO CAPS: 0.4000 mg | ORAL_CAPSULE | Freq: Every day | ORAL | 0 refills | Status: AC

## 2018-02-15 NOTE — ED Notes (Signed)
Patient transported to CT 

## 2018-02-15 NOTE — ED Triage Notes (Signed)
Pt. Arrived via ambulance with general abdominal pain started 2 hour ago.  Pt.l also had an episode of vomiting in route that was blood tinged.  Pt. Arrived and had to go to the bathroom immediately.  Lat BM was yesterday,.   Pt. Is alert and oriented X4.  Skin is warm and dry.

## 2018-02-15 NOTE — ED Provider Notes (Signed)
Emergency Department Provider Note   I have reviewed the triage vital signs and the nursing notes.   HISTORY  Chief Complaint Abdominal Pain and Nausea   HPI Maxwell Faulkner is a 32 y.o. male with PMH of anxiety, depression, and HTN presents to the emergency department for evaluation of acute onset generalized abdominal pain with vomiting.  Patient called EMS and was transported to the emergency department.  He developed some associated back and chest discomfort which has mostly resolved at this point.  He continues to have constant, diffuse abdominal discomfort.  No radiation of symptoms or modifying factors.  He states that the initial episode of vomiting was blood-tinged but denies coffee-ground emesis or additional bright red bleeding.  No dyspnea. No fever, chills, or sick contacts. Denies any radiation to the testicles or testicle pain.    Past Medical History:  Diagnosis Date  . Anxiety   . Depression   . Hypertension     There are no active problems to display for this patient.   History reviewed. No pertinent surgical history.  Current Outpatient Rx  . Order #: [id number] Class: Print  . Order #: [id number] Class: Print  . Order #: [id number] Class: Print  . Order #: [id number] Class: Print  . Order #: [id number] Class: Print    Allergies Patient has no known allergies.  No family history on file.  Social History Social History   Tobacco Use  . Smoking status: Current Every Day Smoker    Packs/day: 2.00    Types: Cigarettes  . Smokeless tobacco: Never Used  Substance Use Topics  . Alcohol use: No    Frequency: Never    Comment: stopped drinking  . Drug use: Yes    Types: Marijuana    Review of Systems  Constitutional: No fever/chills Eyes: No visual changes. ENT: No sore throat. Cardiovascular: Positive chest pain. Respiratory: Denies shortness of breath. Gastrointestinal: Positive diffuse abdominal pain. Positive nausea and vomiting.  No diarrhea.   No constipation. Genitourinary: Negative for dysuria. Musculoskeletal: Positive for back pain. Skin: Negative for rash. Neurological: Negative for headaches, focal weakness or numbness.  10-point ROS otherwise negative.  ____________________________________________   PHYSICAL EXAM:  VITAL SIGNS: ED Triage Vitals  Enc Vitals Group     BP 02/15/18 1045 (!) 146/99     Pulse Rate 02/15/18 1045 64     Resp 02/15/18 1045 18     Temp 02/15/18 1045 98.1 F (36.7 C)     Temp Source 02/15/18 1045 Oral     SpO2 02/15/18 1039 97 %     Weight 02/15/18 1047 130 lb (59 kg)     Height 02/15/18 1047 5\' 6"  (1.676 m)     Pain Score 02/15/18 1046 10   Constitutional: Alert and oriented. Appears uncomfortable.  Eyes: Conjunctivae are normal. Head: Atraumatic. Nose: No congestion/rhinnorhea. Mouth/Throat: Mucous membranes are slightly dry.  Neck: No stridor.   Cardiovascular: Normal rate, regular rhythm. Good peripheral circulation. Grossly normal heart sounds.   Respiratory: Normal respiratory effort.  No retractions. Lungs CTAB. Gastrointestinal: Soft with diffuse mild tenderness. No rebound but some voluntary guarding at times.  Musculoskeletal: No lower extremity tenderness nor edema. No gross deformities of extremities. Neurologic:  Normal speech and language. No gross focal neurologic deficits are appreciated.  Skin:  Skin is warm, dry and intact. No rash noted.  ____________________________________________   LABS (all labs ordered are listed, but only abnormal results are displayed)  Labs Reviewed  COMPREHENSIVE METABOLIC PANEL - Abnormal; Notable for  the following components:      Result Value   Potassium 3.3 (*)    Glucose, Bld 125 (*)    ALT 8 (*)    All other components within normal limits  CBC - Abnormal; Notable for the following components:   WBC 10.8 (*)    Hemoglobin 12.8 (*)    HCT 38.7 (*)    All other components within normal limits  URINALYSIS, ROUTINE W REFLEX  MICROSCOPIC - Abnormal; Notable for the following components:   Hgb urine dipstick LARGE (*)    Ketones, ur 80 (*)    All other components within normal limits  LIPASE, BLOOD   ____________________________________________  EKG   EKG Interpretation  Date/Time:  Friday February 15 2018 14:00:27 EDT Ventricular Rate:  69 PR Interval:  146 QRS Duration: 92 QT Interval:  388 QTC Calculation: 415 R Axis:   89 Text Interpretation:  Sinus rhythm with marked sinus arrhythmia Nonspecific ST abnormality Abnormal ECG No STEMI.  Confirmed by Alona BeneLong, Joshua [phone]) on 02/15/2018 4:05:45 PM       ____________________________________________  RADIOLOGY  Ct Abdomen Pelvis W Contrast  Result Date: 02/15/2018 CLINICAL DATA:  Generalized abdominal pain EXAM: CT ABDOMEN AND PELVIS WITH CONTRAST TECHNIQUE: Multidetector CT imaging of the abdomen and pelvis was performed using the standard protocol following bolus administration of intravenous contrast. CONTRAST:  100mL ISOVUE-300 IOPAMIDOL (ISOVUE-300) INJECTION 61% COMPARISON:  CT abdomen pelvis 03/04/2014 FINDINGS: LOWER CHEST: No basilar pulmonary nodules or pleural effusion. No apical pericardial effusion. HEPATOBILIARY: Normal hepatic contours and density. No intra- or extrahepatic biliary dilatation. Small amount of pericholecystic fluid. No other gallbladder abnormality. PANCREAS: Normal parenchymal contours without ductal dilatation. No peripancreatic fluid collection. SPLEEN: Normal. ADRENALS/URINARY TRACT: --Adrenal glands: Normal. --Right kidney/ureter: No hydronephrosis, nephroureterolithiasis, perinephric stranding or solid renal mass. --Left kidney/ureter: Mild left hydroureter and hydronephrosis. There is a 3 mm stone at the left ureterovesical junction. There is small volume fluid surrounding the left kidney. --Urinary bladder: Normal for degree of distention STOMACH/BOWEL: --Stomach/Duodenum: No hiatal hernia or other gastric abnormality. Normal  duodenal course. --Small bowel: No dilatation or inflammation. --Colon: No focal abnormality. --Appendix: Normal. VASCULAR/LYMPHATIC: Normal course and caliber of the major abdominal vessels. No abdominal or pelvic lymphadenopathy. REPRODUCTIVE: Normal prostate and seminal vesicles. MUSCULOSKELETAL. No bony spinal canal stenosis or focal osseous abnormality. OTHER: None. IMPRESSION: 1. 3 mm left ureterovesical junction stone with mild left hydroureteronephrosis and small volume left perinephric and left paracolic gutter fluid. 2. Small amount of pericholecystic fluid without other gallbladder abnormality. Electronically Signed   By: Deatra RobinsonKevin  Herman M.D.   On: 02/15/2018 18:44    ____________________________________________   PROCEDURES  Procedure(s) performed:   Procedures  None ____________________________________________   INITIAL IMPRESSION / ASSESSMENT AND PLAN / ED COURSE  Pertinent labs & imaging results that were available during my care of the patient were reviewed by me and considered in my medical decision making (see chart for details).  Patient presents to the emergency department with diffuse abdominal pain in the setting of nausea and vomiting.  He experienced some chest pain intermittently in the waiting room which was atypical.  Patient does have some diffuse abdominal discomfort with voluntary guarding.  Because of this exam and relatively unremarkable lab work plan for CT imaging of the abdomen and pelvis.  Pending imaging will treat with morphine and IV fluids  Patient CT shows 3 mm stone at the UVJ with hydronephrosis. Patient's pain has completely resolved with Toradol. Patient is tolerating PO  and feeling well. No UTI on UA. Plan for pain control and Flomax at home with Urology follow up next week.   At this time, I do not feel there is any life-threatening condition present. I have reviewed and discussed all results (EKG, imaging, lab, urine as appropriate), exam  findings with patient. I have reviewed nursing notes and appropriate previous records.  I feel the patient is safe to be discharged home without further emergent workup. Discussed usual and customary return precautions. Patient and family (if present) verbalize understanding and are comfortable with this plan.  Patient will follow-up with their primary care provider. If they do not have a primary care provider, information for follow-up has been provided to them. All questions have been answered.  ____________________________________________  FINAL CLINICAL IMPRESSION(S) / ED DIAGNOSES  Final diagnoses:  Kidney stone  Generalized abdominal pain     MEDICATIONS GIVEN DURING THIS VISIT:  Medications  ondansetron (ZOFRAN-ODT) disintegrating tablet 4 mg (4 mg Oral Given 02/15/18 1053)  morphine 4 MG/ML injection 4 mg (4 mg Intravenous Given 02/15/18 1637)  ondansetron (ZOFRAN) injection 4 mg (4 mg Intravenous Given 02/15/18 1637)  sodium chloride 0.9 % bolus 1,000 mL (0 mLs Intravenous Stopped 02/15/18 1751)  iopamidol (ISOVUE-300) 61 % injection 100 mL (100 mLs Intravenous Contrast Given 02/15/18 1729)  ketorolac (TORADOL) 30 MG/ML injection 30 mg (30 mg Intravenous Given 02/15/18 1806)     NEW OUTPATIENT MEDICATIONS STARTED DURING THIS VISIT:  Discharge Medication List as of 02/15/2018  7:27 PM    START taking these medications   Details  HYDROcodone-acetaminophen (NORCO/VICODIN) 5-325 MG tablet Take 1 tablet by mouth every 6 (six) hours as needed for severe pain., Starting Fri 02/15/2018, Print    tamsulosin (FLOMAX) 0.4 MG CAPS capsule Take 1 capsule (0.4 mg total) by mouth daily for 14 days., Starting Fri 02/15/2018, Until Fri 03/01/2018, Print        Note:  This document was prepared using Dragon voice recognition software and may include unintentional dictation errors.  Alona Bene, MD Emergency Medicine    Long, Arlyss Repress, MD 02/16/18 0830

## 2018-02-15 NOTE — ED Notes (Signed)
Pt in waiting rm complaining of stabbing chest pain to staff. Tech Sarah taking Pt back for an EKG.

## 2018-02-15 NOTE — Discharge Instructions (Signed)
You have been seen in the Emergency Department (ED) today for pain that we believe based on your workup, is caused by kidney stones.  As we have discussed, please drink plenty of fluids.  Please make a follow up appointment with the physician(s) listed elsewhere in this documentation. ° °You may take pain medication as needed but ONLY as prescribed.  Please also take your prescribed Flomax daily.  We also recommend that you take over-the-counter ibuprofen regularly according to label instructions over the next 5 days.  Take it with meals to minimize stomach discomfort. ° °Please see your doctor as soon as possible as stones may take 1-3 weeks to pass and you may require additional care or medications. ° °Do not drink alcohol, drive or participate in any other potentially dangerous activities while taking opiate pain medication as it may make you sleepy. Do not take this medication with any other sedating medications, either prescription or over-the-counter. If you were prescribed Percocet or Vicodin, do not take these with acetaminophen (Tylenol) as it is already contained within these medications. °  °Take Vicodin as needed for severe pain.  This medication is an opiate (or narcotic) pain medication and can be habit forming.  Use it as little as possible to achieve adequate pain control.  Do not use or use it with extreme caution if you have a history of opiate abuse or dependence.  If you are on a pain contract with your primary care doctor or a pain specialist, be sure to let them know you were prescribed this medication today from the Emergency Department.  This medication is intended for your use only - do not give any to anyone else and keep it in a secure place where nobody else, especially children, have access to it.  It will also cause or worsen constipation, so you may want to consider taking an over-the-counter stool softener while you are taking this medication. ° °Return to the Emergency Department  (ED) or call your doctor if you have any worsening pain, fever, painful urination, are unable to urinate, or develop other symptoms that concern you. ° ° ° °Kidney Stones °Kidney stones (urolithiasis) are deposits that form inside your kidneys. The intense pain is caused by the stone moving through the urinary tract. When the stone moves, the ureter goes into spasm around the stone. The stone is usually passed in the urine.  °CAUSES  °A disorder that makes certain neck glands produce too much parathyroid hormone (primary hyperparathyroidism). °A buildup of uric acid crystals, similar to gout in your joints. °Narrowing (stricture) of the ureter. °A kidney obstruction present at birth (congenital obstruction). °Previous surgery on the kidney or ureters. °Numerous kidney infections. °SYMPTOMS  °Feeling sick to your stomach (nauseous). °Throwing up (vomiting). °Blood in the urine (hematuria). °Pain that usually spreads (radiates) to the groin. °Frequency or urgency of urination. °DIAGNOSIS  °Taking a history and physical exam. °Blood or urine tests. °CT scan. °Occasionally, an examination of the inside of the urinary bladder (cystoscopy) is performed. °TREATMENT  °Observation. °Increasing your fluid intake. °Extracorporeal shock wave lithotripsy--This is a noninvasive procedure that uses shock waves to break up kidney stones. °Surgery may be needed if you have severe pain or persistent obstruction. There are various surgical procedures. Most of the procedures are performed with the use of small instruments. Only small incisions are needed to accommodate these instruments, so recovery time is minimized. °The size, location, and chemical composition are all important variables that will determine the proper   choice of action for you. Talk to your health care provider to better understand your situation so that you will minimize the risk of injury to yourself and your kidney.  °HOME CARE INSTRUCTIONS  °Drink enough water  and fluids to keep your urine clear or pale yellow. This will help you to pass the stone or stone fragments. °Strain all urine through the provided strainer. Keep all particulate matter and stones for your health care provider to see. The stone causing the pain may be as small as a grain of salt. It is very important to use the strainer each and every time you pass your urine. The collection of your stone will allow your health care provider to analyze it and verify that a stone has actually passed. The stone analysis will often identify what you can do to reduce the incidence of recurrences. °Only take over-the-counter or prescription medicines for pain, discomfort, or fever as directed by your health care provider. °Keep all follow-up visits as told by your health care provider. This is important. °Get follow-up X-rays if required. The absence of pain does not always mean that the stone has passed. It may have only stopped moving. If the urine remains completely obstructed, it can cause loss of kidney function or even complete destruction of the kidney. It is your responsibility to make sure X-rays and follow-ups are completed. Ultrasounds of the kidney can show blockages and the status of the kidney. Ultrasounds are not associated with any radiation and can be performed easily in a matter of minutes. °Make changes to your daily diet as told by your health care provider. You may be told to: °Limit the amount of salt that you eat. °Eat 5 or more servings of fruits and vegetables each day. °Limit the amount of meat, poultry, fish, and eggs that you eat. °Collect a 24-hour urine sample as told by your health care provider. You may need to collect another urine sample every 6-12 months. °SEEK MEDICAL CARE IF: °You experience pain that is progressive and unresponsive to any pain medicine you have been prescribed. °SEEK IMMEDIATE MEDICAL CARE IF:  °Pain cannot be controlled with the prescribed medicine. °You have a  fever or shaking chills. °The severity or intensity of pain increases over 18 hours and is not relieved by pain medicine. °You develop a new onset of abdominal pain. °You feel faint or pass out. °You are unable to urinate. °  °This information is not intended to replace advice given to you by your health care provider. Make sure you discuss any questions you have with your health care provider. °  °Document Released: 10/16/2005 Document Revised: 07/07/2015 Document Reviewed: 03/19/2013 °Elsevier Interactive Patient Education ©2016 [employer] ° ° °

## 2018-02-15 NOTE — ED Notes (Signed)
Pt asking for apple juice repeatedly despite being reminded he is NPO. Explained to pt that until his CT results are back, he is not to have anything to eat or drink.

## 2018-03-30 ENCOUNTER — Encounter (HOSPITAL_COMMUNITY): Payer: Self-pay | Admitting: Emergency Medicine

## 2018-03-30 ENCOUNTER — Other Ambulatory Visit: Payer: Self-pay

## 2018-03-30 ENCOUNTER — Ambulatory Visit (HOSPITAL_COMMUNITY)
Admission: EM | Admit: 2018-03-30 | Discharge: 2018-03-30 | Disposition: A | Discharge status: Home / Self Care | Payer: Medicaid Other | Attending: Internal Medicine | Admitting: Internal Medicine

## 2018-03-30 DIAGNOSIS — H6123 Impacted cerumen, bilateral: Secondary | ICD-10-CM

## 2018-03-30 DIAGNOSIS — H9203 Otalgia, bilateral: Secondary | ICD-10-CM

## 2018-03-30 DIAGNOSIS — H9193 Unspecified hearing loss, bilateral: Secondary | ICD-10-CM

## 2018-03-30 MED ORDER — CARBAMIDE PEROXIDE 6.5 % OT SOLN: 5.0000 [drp] | Freq: Two times a day (BID) | OTIC | 0 refills | Status: DC

## 2018-03-30 NOTE — ED Triage Notes (Signed)
The patient presented to the [facility] with a complaint of bilateral ear pain x 2 days. 

## 2018-03-30 NOTE — ED Provider Notes (Signed)
[facility]    CSN: [id number] Arrival date & time: 03/30/18  1529     History   Chief Complaint Chief Complaint  Patient presents with  . Otalgia    HPI Maxwell Faulkner is a 32 y.o. male.   [name] presents with complaints of bilateral ear pain. States has had decreased hearing from left ear for approximately 1 month. Right ear started hurting two days ago and now left ear is also painful. States has had in the past and needed cerumen removal. No drainage from ears. No cough or congestion. No fevers. Does not take any medications and has not taken any for these symptoms.   ROS per HPI.      Past Medical History:  Diagnosis Date  . Anxiety   . Depression   . Hypertension     There are no active problems to display for this patient.   History reviewed. No pertinent surgical history.     Home Medications    Prior to Admission medications   Medication Sig Start Date End Date Taking? Authorizing Provider  carbamide peroxide (DEBROX) 6.5 % OTIC solution Place 5 drops into both ears 2 (two) times daily. 03/30/18   Georgetta HaberBurky, Natalie B, NP    Family History History reviewed. No pertinent family history.  Social History Social History   Tobacco Use  . Smoking status: Current Every Day Smoker    Packs/day: 2.00    Types: Cigarettes  . Smokeless tobacco: Never Used  Substance Use Topics  . Alcohol use: No    Frequency: Never    Comment: stopped drinking  . Drug use: Yes    Types: Marijuana     Allergies   Patient has no known allergies.   Review of Systems Review of Systems   Physical Exam Triage Vital Signs ED Triage Vitals  Enc Vitals Group     BP 03/30/18 1549 114/77     Pulse Rate 03/30/18 1549 83     Resp 03/30/18 1549 16     Temp 03/30/18 1549 98.5 F (36.9 C)     Temp Source 03/30/18 1549 Oral     SpO2 03/30/18 1549 100 %     Weight --      Height --      Head Circumference --      Peak Flow --      Pain Score 03/30/18 1547 10      Pain Loc --      Pain Edu? --      Excl. in GC? --    No data found.  Updated Vital Signs BP 114/77 (BP Location: Left Arm)   Pulse 83   Temp 98.5 F (36.9 C) (Oral)   Resp 16   SpO2 100%    Physical Exam  Constitutional: He is oriented to person, place, and time. He appears well-developed and well-nourished.  HENT:  Head: Normocephalic and atraumatic.  Bilateral canals with impacted cerumen on initial exam;   Cardiovascular: Normal rate and regular rhythm.  Pulmonary/Chest: Effort normal and breath sounds normal.  Neurological: He is alert and oriented to person, place, and time.  Skin: Skin is warm and dry.     UC Treatments / Results  Labs (all labs ordered are listed, but only abnormal results are displayed) Labs Reviewed - No data to display  EKG None  Radiology No results found.  Procedures Procedures (including critical care time)  Medications Ordered in UC Medications - No data to display  Initial Impression / Assessment and Plan / UC Course  I have reviewed the triage vital signs and the nursing notes.  Pertinent labs & imaging results that were available during my care of the patient were reviewed by me and considered in my medical decision making (see chart for details).     signficiant impaction which was unable to resolve with irrigation. Debrox use recommended with home irrigation or return. Patient verbalized understanding and agreeable to plan.    Final Clinical Impressions(s) / UC Diagnoses   Final diagnoses:  Bilateral impacted cerumen     Discharge Instructions     Please use debrox drops twice a day to start to loosen the impacted wax.  May use light irrigation at home or return in the next week after debrox use for further irrigation.     ED Prescriptions    Medication Sig Dispense Auth. Provider   carbamide peroxide (DEBROX) 6.5 % OTIC solution Place 5 drops into both ears 2 (two) times daily. 15 mL Linus Mako B, NP       Controlled Substance Prescriptions [place] Controlled Substance Registry consulted? Not Applicable   Georgetta Haber, NP 03/30/18 1818

## 2018-03-30 NOTE — Discharge Instructions (Signed)
Please use debrox drops twice a day to start to loosen the impacted wax.  May use light irrigation at home or return in the next week after debrox use for further irrigation.

## 2018-04-02 ENCOUNTER — Emergency Department (HOSPITAL_COMMUNITY)
Admission: EM | Admit: 2018-04-02 | Discharge: 2018-04-02 | Disposition: A | Discharge status: Home / Self Care | Payer: Medicaid Other | Attending: Emergency Medicine | Admitting: Emergency Medicine

## 2018-04-02 ENCOUNTER — Other Ambulatory Visit: Payer: Self-pay

## 2018-04-02 ENCOUNTER — Encounter (HOSPITAL_COMMUNITY): Payer: Self-pay

## 2018-04-02 DIAGNOSIS — F17210 Nicotine dependence, cigarettes, uncomplicated: Secondary | ICD-10-CM | POA: Insufficient documentation

## 2018-04-02 DIAGNOSIS — H9203 Otalgia, bilateral: Secondary | ICD-10-CM

## 2018-04-02 DIAGNOSIS — H6123 Impacted cerumen, bilateral: Secondary | ICD-10-CM

## 2018-04-02 DIAGNOSIS — I10 Essential (primary) hypertension: Secondary | ICD-10-CM | POA: Insufficient documentation

## 2018-04-02 MED ORDER — AMOXICILLIN 500 MG PO CAPS: 500.0000 mg | ORAL_CAPSULE | Freq: Two times a day (BID) | ORAL | 0 refills | Status: AC

## 2018-04-02 MED ORDER — CARBAMIDE PEROXIDE 6.5 % OT SOLN: 5.0000 [drp] | Freq: Two times a day (BID) | OTIC | 0 refills | Status: DC

## 2018-04-02 MED ORDER — DOCUSATE SODIUM 50 MG/5ML PO LIQD
1.0000 mg | Freq: Once | ORAL | Status: AC
  Administered 2018-04-02: 1 mg via ORAL
  Filled 2018-04-02: qty 10

## 2018-04-02 NOTE — Discharge Instructions (Signed)
You were given a prescription for antibiotics. Please take the antibiotic prescription fully.   You were also given an prescription for eardrops which you need to use as directed on your discharge paperwork.    You were given a referral to the ear nose and throat doctor so that you can follow-up if your symptoms persist.  Please return to the emergency department if you have any new or worsening symptoms in the meantime.

## 2018-04-02 NOTE — ED Triage Notes (Signed)
Patient c/o righ ear pain x 2-3 days.

## 2018-04-02 NOTE — ED Provider Notes (Signed)
[facility]-EMERGENCY DEPT Provider Note   CSN: [id number] Arrival date & time: 04/02/18  1647     History   Chief Complaint Chief Complaint  Patient presents with  . Otalgia    HPI Maxwell Faulkner is a 32 y.o. male.  HPI   32 year old male presented the ED today with complaints of bilateral ear pain which has been ongoing for about 1 month but worsened a few days ago.  He was seen at urgent care and had serum and removal completed.  States that he had improvement of symptoms in the left ear but no improvement on the right.  Was sent home with prescription for Debrox according to note however patient states he never received this prescription and has not filled it.  He did buy over-the-counter eardrops but has had no relief of his symptoms.  No drainage from the ears.  No rhinorrhea, congestion, cough or other URI symptoms.  No fevers.  No headaches lightheadedness or dizziness.  Past Medical History:  Diagnosis Date  . Anxiety   . Depression   . Hypertension     There are no active problems to display for this patient.   History reviewed. No pertinent surgical history.    Home Medications    Prior to Admission medications   Medication Sig Start Date End Date Taking? Authorizing Provider  amoxicillin (AMOXIL) 500 MG capsule Take 1 capsule (500 mg total) by mouth 2 (two) times daily for 7 days. 04/02/18 04/09/18  Deen Deguia S, PA-C  carbamide peroxide (DEBROX) 6.5 % OTIC solution Place 5 drops into both ears 2 (two) times daily. 04/02/18   Apurva Reily S, PA-C    Family History Family History  Problem Relation Age of Onset  . Hypertension Mother     Social History Social History   Tobacco Use  . Smoking status: Current Every Day Smoker    Packs/day: 1.00    Types: Cigarettes  . Smokeless tobacco: Never Used  Substance Use Topics  . Alcohol use: No    Frequency: Never    Comment: stopped drinking  . Drug use: Yes    Types: Marijuana      Allergies   Patient has no known allergies.   Review of Systems Review of Systems  Constitutional: Negative for fever.  HENT: Positive for ear pain. Negative for congestion, rhinorrhea and sore throat.   Respiratory: Negative for shortness of breath.   Cardiovascular: Negative for chest pain.  Gastrointestinal: Negative for abdominal pain and diarrhea.  Musculoskeletal: Negative for back pain.  Skin: Negative for wound.  Neurological: Negative for headaches.     Physical Exam Updated Vital Signs BP 116/73 (BP Location: Left Arm)   Pulse 94   Temp 98.1 F (36.7 C) (Oral)   Resp 15   Ht 5\' 6"  (1.676 m)   Wt 59 kg (130 lb)   SpO2 100%   BMI 20.98 kg/m   Physical Exam  Constitutional: He is oriented to person, place, and time. He appears well-developed and well-nourished. No distress.  HENT:  Bilateral TMs unable to be visualized due to cerumen impaction.  Canals are normal.  Oropharynx is clear and moist.  Nose is normal.  Eyes: Pupils are equal, round, and reactive to light. Conjunctivae and EOM are normal.  Neck: Normal range of motion. Neck supple.  Cardiovascular: Normal rate and regular rhythm.  Pulmonary/Chest: Effort normal and breath sounds normal.  Abdominal: Soft.  Musculoskeletal: Normal range of motion.  Neurological: He  is alert and oriented to person, place, and time. No cranial nerve deficit.  Skin: Skin is warm and dry. Capillary refill takes less than 2 seconds.  Psychiatric: He has a normal mood and affect.   ED Treatments / Results  Labs (all labs ordered are listed, but only abnormal results are displayed) Labs Reviewed - No data to display  EKG None  Radiology No results found.  Procedures .Ear Cerumen Removal Date/Time: 04/02/2018 6:38 PM Performed by: Karrie Meresouture, Tahir Blank S, PA-C Authorized by: Karrie Meresouture, Magally Vahle S, PA-C   Consent:    Consent obtained:  Verbal   Consent given by:  Patient   Risks discussed:  Bleeding and infection    Alternatives discussed:  No treatment Procedure details:    Location: bilat.   Procedure type: curette   Post-procedure details:    Post-procedure ear inspection: no bleeding or macerated skin. TMs unable to be fully visualized after removing portion of wax.   Post-procedure hearing quality: mildly improved on left.   Patient tolerance of procedure:  Tolerated well, no immediate complications   (including critical care time)  Medications Ordered in ED Medications  docusate (COLACE) 50 MG/5ML liquid 1 mg (1 mg Oral Given 04/02/18 1813)     Initial Impression / Assessment and Plan / ED Course  I have reviewed the triage vital signs and the nursing notes.  Pertinent labs & imaging results that were available during my care of the patient were reviewed by me and considered in my medical decision making (see chart for details).   Final Clinical Impressions(s) / ED Diagnoses   Final diagnoses:  Otalgia of both ears  Bilateral impacted cerumen   32 year old male resenting with bilateral ear pain worsening over the last 2 to 3 days.  Afebrile emergency department no acute distress and normal vital signs.  Was recently seen in urgent care and diagnosed with bilateral cerumen impaction.  Had been given prescription for Debrox but patient has not filled this prescription.  Has tried over-the-counter eardrops without relief.  States he uses Q-tips.  On exam patient has bilateral cerumen impactions.  Attempted to complete cerumen disimpaction after administering Colace in both ears.  Some wax was obtained however patient still complaining of some pain and there is some wax present in the canal still.  Will give prescription for Debrox for home.  Also gave prescription for amoxicillin for potential otitis media given I cannot visualize his tympanic membrane.  Also gave ENT follow-up if symptoms persist.  Return precautions discussed.  All questions were answered and patient understands plan and reasons  to return immediately to the ED.  ED Discharge Orders        Ordered    amoxicillin (AMOXIL) 500 MG capsule  2 times daily     04/02/18 1835    carbamide peroxide (DEBROX) 6.5 % OTIC solution  2 times daily     04/02/18 1835       Karrie MeresCouture, Christmas Faraci S, PA-C 04/02/18 1839    Bethann BerkshireZammit, Joseph, MD 04/05/18 1309

## 2018-09-20 ENCOUNTER — Encounter (HOSPITAL_COMMUNITY): Payer: Self-pay | Admitting: Emergency Medicine

## 2018-09-20 ENCOUNTER — Other Ambulatory Visit: Payer: Self-pay

## 2018-09-20 ENCOUNTER — Ambulatory Visit (HOSPITAL_COMMUNITY)
Admission: EM | Admit: 2018-09-20 | Discharge: 2018-09-20 | Discharge status: Against Medical Advice | Payer: Self-pay | Attending: Family Medicine | Admitting: Family Medicine

## 2018-09-20 ENCOUNTER — Emergency Department (HOSPITAL_COMMUNITY)
Admission: EM | Admit: 2018-09-20 | Discharge: 2018-09-21 | Disposition: A | Discharge status: Home / Self Care | Payer: No Typology Code available for payment source | Attending: Emergency Medicine | Admitting: Emergency Medicine

## 2018-09-20 ENCOUNTER — Ambulatory Visit (HOSPITAL_COMMUNITY): Payer: Self-pay

## 2018-09-20 DIAGNOSIS — S29012A Strain of muscle and tendon of back wall of thorax, initial encounter: Secondary | ICD-10-CM | POA: Diagnosis not present

## 2018-09-20 DIAGNOSIS — Y999 Unspecified external cause status: Secondary | ICD-10-CM | POA: Diagnosis not present

## 2018-09-20 DIAGNOSIS — Z79899 Other long term (current) drug therapy: Secondary | ICD-10-CM | POA: Diagnosis not present

## 2018-09-20 DIAGNOSIS — T148XXA Other injury of unspecified body region, initial encounter: Secondary | ICD-10-CM

## 2018-09-20 DIAGNOSIS — S199XXA Unspecified injury of neck, initial encounter: Secondary | ICD-10-CM | POA: Diagnosis present

## 2018-09-20 DIAGNOSIS — Y92410 Unspecified street and highway as the place of occurrence of the external cause: Secondary | ICD-10-CM | POA: Insufficient documentation

## 2018-09-20 DIAGNOSIS — I10 Essential (primary) hypertension: Secondary | ICD-10-CM | POA: Diagnosis not present

## 2018-09-20 DIAGNOSIS — S161XXA Strain of muscle, fascia and tendon at neck level, initial encounter: Secondary | ICD-10-CM | POA: Insufficient documentation

## 2018-09-20 DIAGNOSIS — M542 Cervicalgia: Secondary | ICD-10-CM

## 2018-09-20 DIAGNOSIS — F17210 Nicotine dependence, cigarettes, uncomplicated: Secondary | ICD-10-CM | POA: Diagnosis not present

## 2018-09-20 DIAGNOSIS — Y939 Activity, unspecified: Secondary | ICD-10-CM | POA: Insufficient documentation

## 2018-09-20 MED ORDER — KETOROLAC TROMETHAMINE 60 MG/2ML IM SOLN: 60.0000 mg | Freq: Once | INTRAMUSCULAR | Status: DC

## 2018-09-20 MED ORDER — KETOROLAC TROMETHAMINE 60 MG/2ML IM SOLN
INTRAMUSCULAR | Status: AC
  Filled 2018-09-20: qty 2

## 2018-09-20 MED ORDER — KETOROLAC TROMETHAMINE 60 MG/2ML IM SOLN
30.0000 mg | Freq: Once | INTRAMUSCULAR | Status: AC
  Administered 2018-09-20: 30 mg via INTRAMUSCULAR
  Filled 2018-09-20: qty 2

## 2018-09-20 NOTE — ED Notes (Signed)
Patient refused pain medication and then refused x ray. Stated that he was not taking any shots in his hip. Informed patient that the medication was too much to be given in his arm. Patient was then offered another form of pain medication by Dahlia Byesraci Bast RN. Patient refused and said he was leaving.
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
## 2018-09-21 ENCOUNTER — Emergency Department (HOSPITAL_COMMUNITY): Payer: No Typology Code available for payment source

## 2018-09-21 MED ORDER — NAPROXEN SODIUM 220 MG PO TABS: 220.0000 mg | ORAL_TABLET | Freq: Two times a day (BID) | ORAL | Status: AC | PRN

## 2018-09-21 MED ORDER — CYCLOBENZAPRINE HCL 10 MG PO TABS: 5.0000 mg | ORAL_TABLET | Freq: Every day | ORAL | 0 refills | Status: AC

## 2018-09-21 MED ORDER — ACETAMINOPHEN 500 MG PO TABS: 1000.0000 mg | ORAL_TABLET | Freq: Three times a day (TID) | ORAL | 0 refills | Status: AC

## 2018-09-21 NOTE — Discharge Instructions (Signed)
You may use over-the-counter Motrin (Ibuprofen), Acetaminophen (Tylenol), topical muscle creams such as SalonPas, Federal-Mogulcy Hot, Bengay, etc. Please stretch, apply ice for the next 2-4 days, then you may apply heat, and have massage therapy for additional assistance.

## 2018-12-13 ENCOUNTER — Encounter (HOSPITAL_COMMUNITY): Payer: Self-pay | Admitting: Emergency Medicine

## 2018-12-13 ENCOUNTER — Ambulatory Visit (HOSPITAL_COMMUNITY)
Admission: EM | Admit: 2018-12-13 | Discharge: 2018-12-13 | Disposition: A | Discharge status: Home / Self Care | Payer: Medicaid Other | Attending: Internal Medicine | Admitting: Internal Medicine

## 2018-12-13 DIAGNOSIS — K047 Periapical abscess without sinus: Secondary | ICD-10-CM

## 2018-12-13 MED ORDER — ACETAMINOPHEN 500 MG PO TABS: 500.0000 mg | ORAL_TABLET | Freq: Four times a day (QID) | ORAL | Status: DC | PRN

## 2018-12-13 MED ORDER — AMOXICILLIN-POT CLAVULANATE 875-125 MG PO TABS: 1.0000 | ORAL_TABLET | Freq: Two times a day (BID) | ORAL | 0 refills | Status: DC

## 2018-12-13 MED ORDER — IBUPROFEN 400 MG PO TABS: 400.0000 mg | ORAL_TABLET | Freq: Four times a day (QID) | ORAL | 0 refills | Status: DC | PRN

## 2018-12-13 NOTE — ED Triage Notes (Signed)
Pt here with left upper dental pain and swelling

## 2018-12-13 NOTE — ED Provider Notes (Addendum)
[facility]    CSN: [id number] Arrival date & time: 12/13/18  0957     History   Chief Complaint Chief Complaint  Patient presents with  . Dental Pain    HPI Maxwell Faulkner is a 33 y.o. male with a history of hypertension comes to the urgent care on account of left jaw pain of 1 day duration.  Pain started insidiously and worsened yesterday.  Patient was playing with his son when the pain worsened.  Pain is severe at this time.  No relieving factors.  Aggravated by palpation.  Patient denies fever or chills.  No nausea or vomiting.   HPI  Past Medical History:  Diagnosis Date  . Anxiety   . Depression   . Hypertension     There are no active problems to display for this patient.   History reviewed. No pertinent surgical history.     Home Medications    Prior to Admission medications   Medication Sig Start Date End Date Taking? Authorizing Provider  acetaminophen (TYLENOL) 500 MG tablet Take 1 tablet (500 mg total) by mouth every 6 (six) hours as needed. 12/13/18   Merrilee Jansky, MD  amoxicillin-clavulanate (AUGMENTIN) 875-125 MG tablet Take 1 tablet by mouth every 12 (twelve) hours. 12/13/18   Trustin Chapa, Britta Mccreedy, MD  carbamide peroxide (DEBROX) 6.5 % OTIC solution Place 5 drops into both ears 2 (two) times daily. 04/02/18   Couture, Cortni S, PA-C  ibuprofen (ADVIL,MOTRIN) 400 MG tablet Take 1 tablet (400 mg total) by mouth every 6 (six) hours as needed. 12/13/18   Tajanay Hurley, Britta Mccreedy, MD    Family History Family History  Problem Relation Age of Onset  . Hypertension Mother     Social History Social History   Tobacco Use  . Smoking status: Current Every Day Smoker    Packs/day: 1.00    Types: Cigarettes  . Smokeless tobacco: Never Used  Substance Use Topics  . Alcohol use: No    Frequency: Never    Comment: stopped drinking  . Drug use: Yes    Types: Marijuana     Allergies   Patient has no known allergies.   Review of Systems Review of  Systems  HENT: Positive for dental problem. Negative for ear discharge, ear pain, postnasal drip, rhinorrhea, sinus pressure, sinus pain and sneezing.   Eyes: Negative for pain, redness and itching.  Respiratory: Negative for cough and shortness of breath.   Gastrointestinal: Negative for abdominal distention.     Physical Exam Triage Vital Signs ED Triage Vitals  Enc Vitals Group     BP 12/13/18 1034 111/65     Pulse Rate 12/13/18 1034 84     Resp 12/13/18 1034 18     Temp 12/13/18 1034 98.6 F (37 C)     Temp Source 12/13/18 1034 Temporal     SpO2 12/13/18 1034 100 %     Weight --      Height --      Head Circumference --      Peak Flow --      Pain Score 12/13/18 1035 8     Pain Loc --      Pain Edu? --      Excl. in GC? --    No data found.  Updated Vital Signs BP 111/65 (BP Location: Right Arm)   Pulse 84   Temp 98.6 F (37 C) (Temporal)   Resp 18   SpO2 100%   Visual  Acuity Right Eye Distance:   Left Eye Distance:   Bilateral Distance:    Right Eye Near:   Left Eye Near:    Bilateral Near:     Physical Exam Vitals signs and nursing note reviewed.  Constitutional:      General: He is in acute distress.     Appearance: Normal appearance. He is not ill-appearing or toxic-appearing.  HENT:     Right Ear: Tympanic membrane normal.     Left Ear: Tympanic membrane normal.     Nose: Nose normal.     Mouth/Throat:     Pharynx: No oropharyngeal exudate or posterior oropharyngeal erythema.     Comments: First left molar tooth is tender to palpation.  Abscess drainage from the left side of the mandible underneath the left molar is visible.  It is tender to palpation. Neck:     Musculoskeletal: Normal range of motion. No neck rigidity.  Cardiovascular:     Rate and Rhythm: Normal rate and regular rhythm.  Lymphadenopathy:     Cervical: No cervical adenopathy.  Neurological:     Mental Status: He is alert.      UC Treatments / Results  Labs (all labs  ordered are listed, but only abnormal results are displayed) Labs Reviewed - No data to display  EKG None  Radiology No results found.  Procedures Procedures (including critical care time)  Medications Ordered in UC Medications - No data to display  Initial Impression / Assessment and Plan / UC Course  I have reviewed the triage vital signs and the nursing notes.  Pertinent labs & imaging results that were available during my care of the patient were reviewed by me and considered in my medical decision making (see chart for details).    1.  Dental abscess: Patient will need a tooth to be extracted and the abscess drained Augmentin 875/125 mg twice daily for 7 days Tylenol/Motrin to be alternated for pain. Final Clinical Impressions(s) / UC Diagnoses   Final diagnoses:  Dental abscess   Discharge Instructions   None    ED Prescriptions    Medication Sig Dispense Auth. Provider   amoxicillin-clavulanate (AUGMENTIN) 875-125 MG tablet Take 1 tablet by mouth every 12 (twelve) hours. 14 tablet Bethania Schlotzhauer, Britta Mccreedy, MD   ibuprofen (ADVIL,MOTRIN) 400 MG tablet Take 1 tablet (400 mg total) by mouth every 6 (six) hours as needed. 30 tablet [place] Liz, Britta Mccreedy, MD   acetaminophen (TYLENOL) 500 MG tablet Take 1 tablet (500 mg total) by mouth every 6 (six) hours as needed.  Merrilee Jansky, MD     Controlled Substance Prescriptions [place] Controlled Substance Registry consulted? No   Merrilee Jansky, MD 12/13/18 1112    Merrilee Jansky, MD 12/13/18 1113

## 2019-03-29 ENCOUNTER — Other Ambulatory Visit: Payer: Self-pay

## 2019-03-29 ENCOUNTER — Emergency Department (HOSPITAL_COMMUNITY)
Admission: EM | Admit: 2019-03-29 | Discharge: 2019-03-29 | Disposition: A | Discharge status: Home / Self Care | Payer: No Typology Code available for payment source | Attending: Emergency Medicine | Admitting: Emergency Medicine

## 2019-03-29 ENCOUNTER — Emergency Department (HOSPITAL_COMMUNITY): Payer: No Typology Code available for payment source

## 2019-03-29 ENCOUNTER — Encounter (HOSPITAL_COMMUNITY): Payer: Self-pay | Admitting: Emergency Medicine

## 2019-03-29 DIAGNOSIS — S80812A Abrasion, left lower leg, initial encounter: Secondary | ICD-10-CM | POA: Diagnosis not present

## 2019-03-29 DIAGNOSIS — S40812A Abrasion of left upper arm, initial encounter: Secondary | ICD-10-CM | POA: Insufficient documentation

## 2019-03-29 DIAGNOSIS — Y92410 Unspecified street and highway as the place of occurrence of the external cause: Secondary | ICD-10-CM | POA: Diagnosis not present

## 2019-03-29 DIAGNOSIS — Y999 Unspecified external cause status: Secondary | ICD-10-CM | POA: Insufficient documentation

## 2019-03-29 DIAGNOSIS — F17210 Nicotine dependence, cigarettes, uncomplicated: Secondary | ICD-10-CM | POA: Diagnosis not present

## 2019-03-29 DIAGNOSIS — R1084 Generalized abdominal pain: Secondary | ICD-10-CM | POA: Insufficient documentation

## 2019-03-29 DIAGNOSIS — Y9389 Activity, other specified: Secondary | ICD-10-CM | POA: Diagnosis not present

## 2019-03-29 DIAGNOSIS — Z23 Encounter for immunization: Secondary | ICD-10-CM | POA: Diagnosis not present

## 2019-03-29 DIAGNOSIS — I10 Essential (primary) hypertension: Secondary | ICD-10-CM | POA: Insufficient documentation

## 2019-03-29 DIAGNOSIS — T07XXXA Unspecified multiple injuries, initial encounter: Secondary | ICD-10-CM

## 2019-03-29 DIAGNOSIS — S0990XA Unspecified injury of head, initial encounter: Secondary | ICD-10-CM | POA: Diagnosis present

## 2019-03-29 LAB — BASIC METABOLIC PANEL
Anion gap: 6 (ref 5–15)
BUN: 8 mg/dL (ref 6–20)
CO2: 26 mmol/L (ref 22–32)
Calcium: 8.8 mg/dL — ABNORMAL LOW (ref 8.9–10.3)
Chloride: 110 mmol/L (ref 98–111)
Creatinine, Ser: 0.76 mg/dL (ref 0.61–1.24)
GFR calc Af Amer: >60 mL/min (ref >60)
GFR calc non Af Amer: >60 mL/min (ref >60)
Glucose, Bld: 80 mg/dL (ref 70–99)
Potassium: 3.4 mmol/L — ABNORMAL LOW (ref 3.5–5.1)
Sodium: 142 mmol/L (ref 135–145)

## 2019-03-29 LAB — CBC
HCT: 33.9 % — ABNORMAL LOW (ref 39.0–52.0)
Hemoglobin: 11.1 g/dL — ABNORMAL LOW (ref 13.0–17.0)
MCH: 31.2 pg (ref 26.0–34.0)
MCHC: 32.7 g/dL (ref 30.0–36.0)
MCV: 95.2 fL (ref 80.0–100.0)
Platelets: 188 10*3/uL (ref 150–400)
RBC: 3.56 MIL/uL — ABNORMAL LOW (ref 4.22–5.81)
RDW: 13.5 % (ref 11.5–15.5)
WBC: 11.5 10*3/uL — ABNORMAL HIGH (ref 4.0–10.5)
nRBC: 0 % (ref 0.0–0.2)

## 2019-03-29 MED ORDER — NAPROXEN 500 MG PO TABS: 500.0000 mg | ORAL_TABLET | Freq: Two times a day (BID) | ORAL | 0 refills | Status: DC

## 2019-03-29 MED ORDER — CYCLOBENZAPRINE HCL 10 MG PO TABS: 10.0000 mg | ORAL_TABLET | Freq: Two times a day (BID) | ORAL | 0 refills | Status: DC | PRN

## 2019-03-29 MED ORDER — SODIUM CHLORIDE (PF) 0.9 % IJ SOLN
INTRAMUSCULAR | Status: AC
  Filled 2019-03-29: qty 50

## 2019-03-29 MED ORDER — BACITRACIN ZINC 500 UNIT/GM EX OINT
1.0000 | TOPICAL_OINTMENT | Freq: Once | CUTANEOUS | Status: AC
Start: 2019-03-29 — End: 2019-03-29
  Administered 2019-03-29: 1 via TOPICAL
  Filled 2019-03-29: qty 0.9

## 2019-03-29 MED ORDER — MORPHINE SULFATE (PF) 4 MG/ML IV SOLN
4.0000 mg | Freq: Once | INTRAVENOUS | Status: AC
  Administered 2019-03-29: 4 mg via INTRAVENOUS
  Filled 2019-03-29: qty 1

## 2019-03-29 MED ORDER — TETANUS-DIPHTH-ACELL PERTUSSIS 5-2.5-18.5 LF-MCG/0.5 IM SUSP
0.5000 mL | Freq: Once | INTRAMUSCULAR | Status: AC
  Administered 2019-03-29: 21:00:00 0.5 mL via INTRAMUSCULAR
  Filled 2019-03-29: qty 0.5

## 2019-03-29 MED ORDER — IOHEXOL 300 MG/ML  SOLN
100.0000 mL | Freq: Once | INTRAMUSCULAR | Status: AC | PRN
  Administered 2019-03-29: 22:00:00 100 mL via INTRAVENOUS
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
## 2019-03-29 NOTE — ED Notes (Signed)
Patient transported to X-ray 
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
## 2019-03-29 NOTE — ED Notes (Signed)
Patient transported to CT 

## 2019-03-29 NOTE — Discharge Instructions (Addendum)
Take the medications as needed for pain.  Apply ice to areas of swelling.  Make sure to apply antibiotic ointment to the abrasions

## 2019-04-17 IMAGING — DX DG CHEST 2V
2 series · 2 of 2 positions shown · non-contrast
Comparison: Chest radiograph 11/30/2014.

CLINICAL DATA: Patient status post fall. Anterior right and
left-sided chest pain. Shortness of breath.

EXAM:
CHEST  2 VIEW

[chest lat]
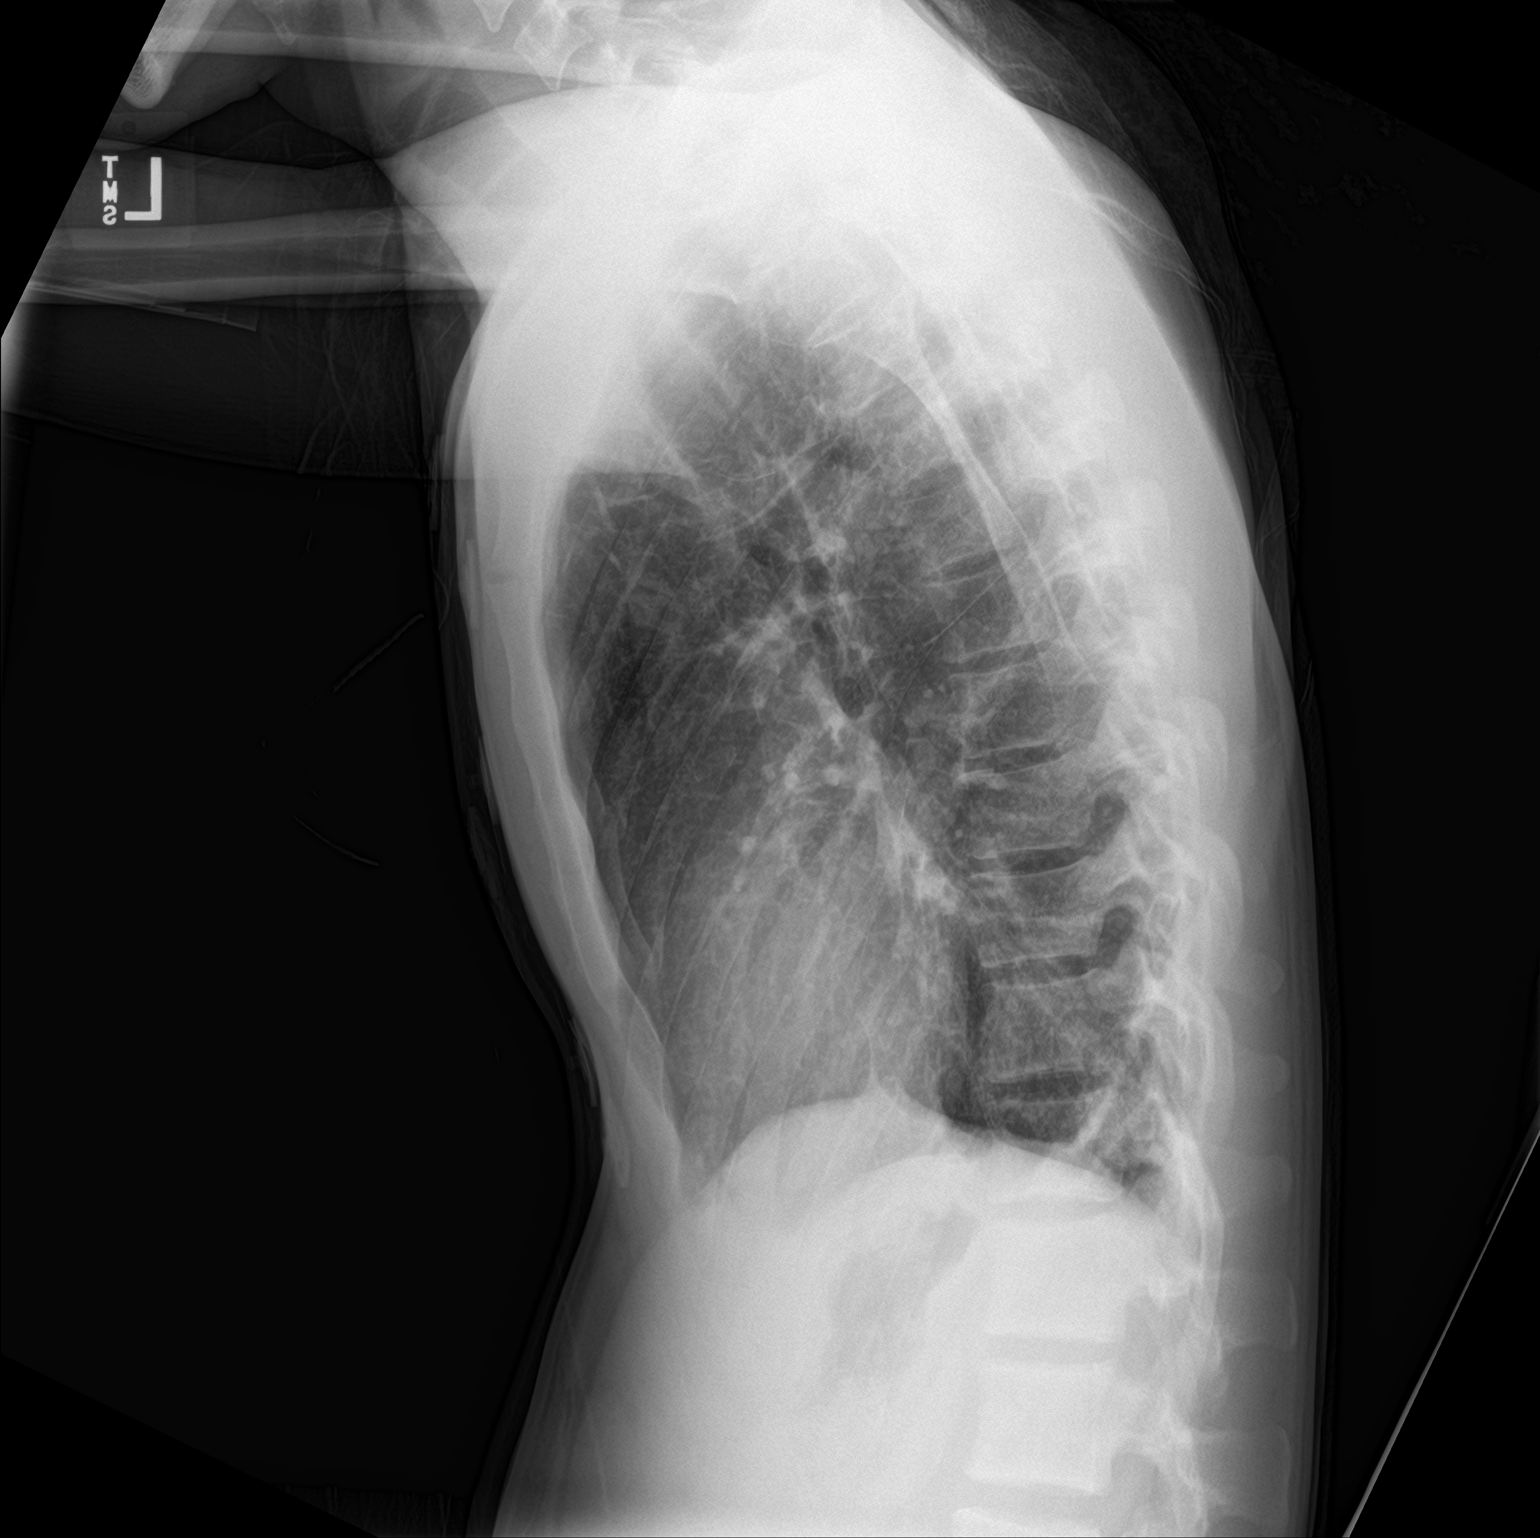

[chest ap]
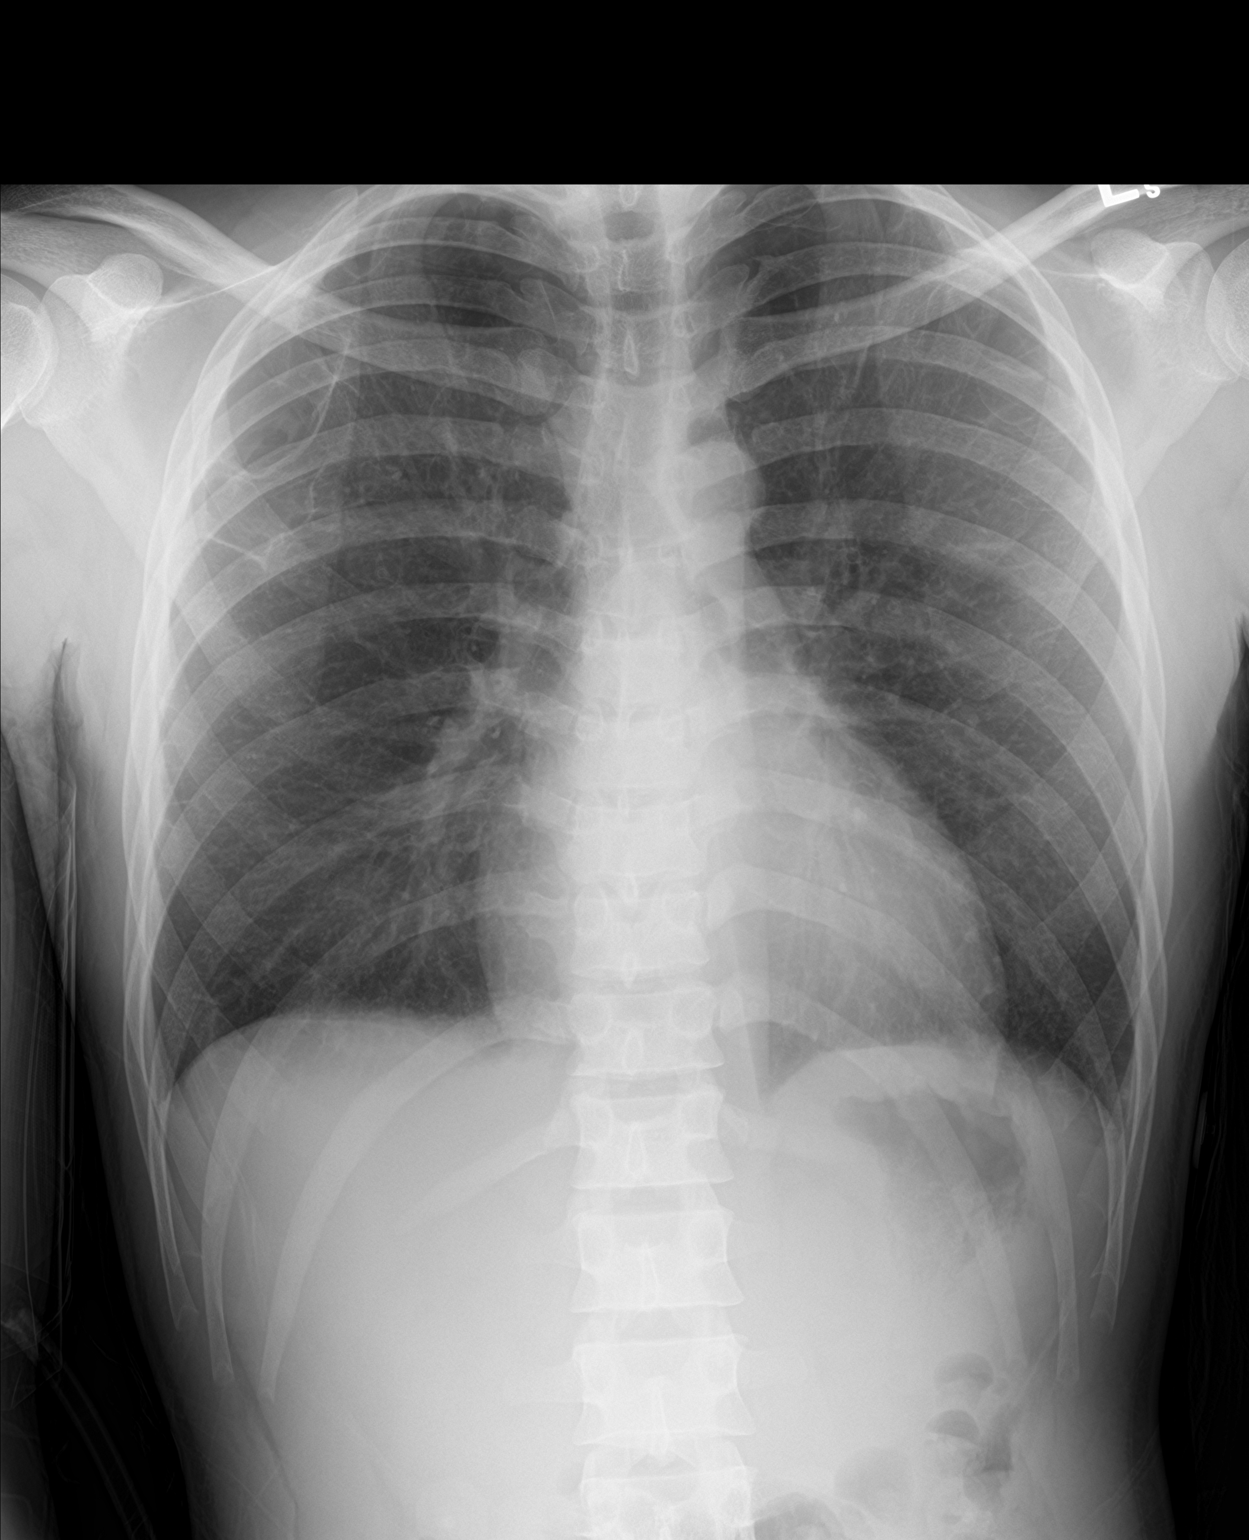

[2 of 2 positions shown; findings below may reference images not displayed]

FINDINGS: Normal cardiac and mediastinal contours. No consolidative pulmonary
opacities. Biapical bullous change. No pleural effusion or
pneumothorax. Thoracic spine degenerative changes.
IMPRESSION: No acute cardiopulmonary process.

## 2019-12-07 ENCOUNTER — Encounter (HOSPITAL_COMMUNITY): Payer: Self-pay | Admitting: Emergency Medicine

## 2019-12-07 ENCOUNTER — Other Ambulatory Visit: Payer: Self-pay

## 2019-12-07 ENCOUNTER — Ambulatory Visit (HOSPITAL_COMMUNITY)
Admission: EM | Admit: 2019-12-07 | Discharge: 2019-12-07 | Disposition: A | Discharge status: Home / Self Care | Payer: Medicaid Other | Attending: Emergency Medicine | Admitting: Emergency Medicine

## 2019-12-07 DIAGNOSIS — L02412 Cutaneous abscess of left axilla: Secondary | ICD-10-CM

## 2019-12-07 MED ORDER — IBUPROFEN 800 MG PO TABS: 800.0000 mg | ORAL_TABLET | Freq: Three times a day (TID) | ORAL | 0 refills | Status: DC | PRN

## 2019-12-07 MED ORDER — IBUPROFEN 800 MG PO TABS
ORAL_TABLET | ORAL | Status: AC
  Filled 2019-12-07: qty 1

## 2019-12-07 MED ORDER — DOXYCYCLINE HYCLATE 100 MG PO CAPS: 100.0000 mg | ORAL_CAPSULE | Freq: Two times a day (BID) | ORAL | 0 refills | Status: DC

## 2019-12-07 MED ORDER — LIDOCAINE-EPINEPHRINE (PF) 2 %-1:200000 IJ SOLN
INTRAMUSCULAR | Status: AC
  Filled 2019-12-07: qty 20

## 2019-12-07 MED ORDER — IBUPROFEN 800 MG PO TABS
800.0000 mg | ORAL_TABLET | Freq: Once | ORAL | Status: AC
  Administered 2019-12-07: 800 mg via ORAL

## 2019-12-07 NOTE — Discharge Instructions (Signed)
Keep dressing in place for the next 24 hours. May remove tomorrow.  Then cleanse area daily with soap and water.  Keep covered to keep protected and collect drainage.  Complete course of antibiotics.  Warm compresses regularly to promote further drainage.  If worsening or no improvement in the next week please return to be seen.

## 2019-12-07 NOTE — ED Triage Notes (Signed)
Pt states for the last 5-6 days hes had a boil underneath his L armpit and its progressively gotten worse. States hes had these before and they normally "bust on their own after two days" but this time its gotten bigger and not better.

## 2019-12-07 NOTE — ED Provider Notes (Signed)
[place]    CSN: [id number] Arrival date & time: 12/07/19  1002      History   Chief Complaint Chief Complaint  Patient presents with  . Abscess    HPI Maxwell Faulkner is a 34 y.o. male.   Maxwell Faulkner presents with complaints of abscess to left axilla. Noted it three days ago and has worsened. Increased in size and pain. No fevers. Hasn't drained. He has applied warm compresses followed by alcohol to the area which didn't help. Hasn't taken any medications for pain. Has had similar in the past and has required I&D in the past, to the same area even. No known MRSA.     ROS per HPI, negative if not otherwise mentioned.      Past Medical History:  Diagnosis Date  . Anxiety   . Depression   . Hypertension     There are no problems to display for this patient.   History reviewed. No pertinent surgical history.     Home Medications    Prior to Admission medications   Medication Sig Start Date End Date Taking? Authorizing Provider  doxycycline (VIBRAMYCIN) 100 MG capsule Take 1 capsule (100 mg total) by mouth 2 (two) times daily. 12/07/19   Zigmund Gottron, NP  ibuprofen (ADVIL) 800 MG tablet Take 1 tablet (800 mg total) by mouth every 8 (eight) hours as needed for mild pain or moderate pain. 12/07/19   Zigmund Gottron, NP    Family History Family History  Problem Relation Age of Onset  . Hypertension Mother     Social History Social History   Tobacco Use  . Smoking status: Current Every Day Smoker    Packs/day: 1.00    Types: Cigarettes  . Smokeless tobacco: Never Used  Substance Use Topics  . Alcohol use: No    Comment: stopped drinking  . Drug use: Yes    Types: Marijuana     Allergies   Patient has no known allergies.   Review of Systems Review of Systems   Physical Exam Triage Vital Signs ED Triage Vitals  Enc Vitals Group     BP 12/07/19 1014 (!) 138/95     Pulse Rate 12/07/19 1014 (!) 103     Resp 12/07/19 1014 18     Temp 12/07/19 1014 98.7 F (37.1 C)     Temp src --      SpO2 12/07/19 1014 100 %     Weight --      Height --      Head Circumference --      Peak Flow --      Pain Score 12/07/19 1015 10     Pain Loc --      Pain Edu? --      Excl. in [place]? --    No data found.  Updated Vital Signs BP (!) 138/95   Pulse (!) 103   Temp 98.7 F (37.1 C)   Resp 18   SpO2 100%   Visual Acuity Right Eye Distance:   Left Eye Distance:   Bilateral Distance:    Right Eye Near:   Left Eye Near:    Bilateral Near:     Physical Exam Constitutional:      Appearance: He is well-developed.  Cardiovascular:     Rate and Rhythm: Normal rate.  Pulmonary:     Effort: Pulmonary effort is normal.  Skin:    General: Skin is warm and dry.  Comments: Left axilla with 2cm fluctuant abscess; no surrounding induration; tender   Neurological:     Mental Status: He is alert and oriented to person, place, and time.      UC Treatments / Results  Labs (all labs ordered are listed, but only abnormal results are displayed) Labs Reviewed - No data to display  EKG   Radiology No results found.  Procedures Incision and Drainage  Date/Time: 12/07/2019 10:39 AM Performed by: Georgetta Haber, NP Authorized by: Georgetta Haber, NP   Consent:    Consent obtained:  Verbal   Consent given by:  Patient   Risks discussed:  Bleeding, incomplete drainage, pain and infection   Alternatives discussed:  No treatment, observation, referral and delayed treatment Location:    Type:  Abscess   Size:  2   Location:  Upper extremity   Upper extremity location:  Arm   Arm location:  L upper arm (axilla ) Pre-procedure details:    Skin preparation:  Betadine Anesthesia (see MAR for exact dosages):    Anesthesia method:  Local infiltration   Local anesthetic:  Lidocaine 2% WITH epi Procedure details:    Incision types:  Single straight   Scalpel blade:  11   Wound management:  Probed and deloculated    Drainage:  Purulent and bloody   Drainage amount:  Moderate   Wound treatment:  Wound left open   Packing materials:  None Post-procedure details:    Patient tolerance of procedure:  Tolerated well, no immediate complications   (including critical care time)  Medications Ordered in UC Medications  ibuprofen (ADVIL) tablet 800 mg (has no administration in time range)    Initial Impression / Assessment and Plan / UC Course  I have reviewed the triage vital signs and the nursing notes.  Pertinent labs & imaging results that were available during my care of the patient were reviewed by me and considered in my medical decision making (see chart for details).     Successful incision and drainage with purulent drainage. Patient tolerated well. Antibiotics provided as well. I&D post care education provided. Return precautions provided. Patient verbalized understanding and agreeable to plan.   Final Clinical Impressions(s) / UC Diagnoses   Final diagnoses:  Abscess of left axilla     Discharge Instructions     Keep dressing in place for the next 24 hours. May remove tomorrow.  Then cleanse area daily with soap and water.  Keep covered to keep protected and collect drainage.  Complete course of antibiotics.  Warm compresses regularly to promote further drainage.  If worsening or no improvement in the next week please return to be seen.     ED Prescriptions    Medication Sig Dispense Auth. Provider   doxycycline (VIBRAMYCIN) 100 MG capsule Take 1 capsule (100 mg total) by mouth 2 (two) times daily. 20 capsule Linus Mako B, NP   ibuprofen (ADVIL) 800 MG tablet Take 1 tablet (800 mg total) by mouth every 8 (eight) hours as needed for mild pain or moderate pain. 21 tablet Georgetta Haber, NP     PDMP not reviewed this encounter.   Georgetta Haber, NP 12/07/19 1041

## 2020-09-17 ENCOUNTER — Other Ambulatory Visit: Payer: Self-pay

## 2020-09-17 ENCOUNTER — Encounter (HOSPITAL_COMMUNITY): Payer: Self-pay | Admitting: Emergency Medicine

## 2020-09-17 ENCOUNTER — Ambulatory Visit (HOSPITAL_COMMUNITY)
Admission: EM | Admit: 2020-09-17 | Discharge: 2020-09-17 | Disposition: A | Discharge status: Home / Self Care | Payer: Self-pay | Attending: Family Medicine | Admitting: Family Medicine

## 2020-09-17 DIAGNOSIS — F1290 Cannabis use, unspecified, uncomplicated: Secondary | ICD-10-CM | POA: Insufficient documentation

## 2020-09-17 DIAGNOSIS — R1030 Lower abdominal pain, unspecified: Secondary | ICD-10-CM

## 2020-09-17 DIAGNOSIS — K921 Melena: Secondary | ICD-10-CM | POA: Insufficient documentation

## 2020-09-17 LAB — CBC WITH DIFFERENTIAL/PLATELET
Abs Immature Granulocytes: 0.01 10*3/uL (ref 0.00–0.07)
Basophils Absolute: 0 10*3/uL (ref 0.0–0.1)
Basophils Relative: 1 %
Eosinophils Absolute: 0.3 10*3/uL (ref 0.0–0.5)
Eosinophils Relative: 4 %
HCT: 37 % — ABNORMAL LOW (ref 39.0–52.0)
Hemoglobin: 12.2 g/dL — ABNORMAL LOW (ref 13.0–17.0)
Immature Granulocytes: 0 %
Lymphocytes Relative: 41 %
Lymphs Abs: 3.1 10*3/uL (ref 0.7–4.0)
MCH: 31.1 pg (ref 26.0–34.0)
MCHC: 33 g/dL (ref 30.0–36.0)
MCV: 94.4 fL (ref 80.0–100.0)
Monocytes Absolute: 0.4 10*3/uL (ref 0.1–1.0)
Monocytes Relative: 5 %
Neutro Abs: 3.8 10*3/uL (ref 1.7–7.7)
Neutrophils Relative %: 49 %
Platelets: 213 10*3/uL (ref 150–400)
RBC: 3.92 MIL/uL — ABNORMAL LOW (ref 4.22–5.81)
RDW: 12.5 % (ref 11.5–15.5)
WBC: 7.6 10*3/uL (ref 4.0–10.5)
nRBC: 0 % (ref 0.0–0.2)

## 2020-09-17 LAB — COMPREHENSIVE METABOLIC PANEL
ALT: 19 U/L (ref 0–44)
AST: 20 U/L (ref 15–41)
Albumin: 3.9 g/dL (ref 3.5–5.0)
Alkaline Phosphatase: 52 U/L (ref 38–126)
Anion gap: 10 (ref 5–15)
BUN: 7 mg/dL (ref 6–20)
CO2: 26 mmol/L (ref 22–32)
Calcium: 9.3 mg/dL (ref 8.9–10.3)
Chloride: 105 mmol/L (ref 98–111)
Creatinine, Ser: 0.69 mg/dL (ref 0.61–1.24)
GFR, Estimated: >60 mL/min (ref >60)
Glucose, Bld: 90 mg/dL (ref 70–99)
Potassium: 4.1 mmol/L (ref 3.5–5.1)
Sodium: 141 mmol/L (ref 135–145)
Total Bilirubin: 0.5 mg/dL (ref 0.3–1.2)
Total Protein: 6.7 g/dL (ref 6.5–8.1)

## 2020-09-17 LAB — POCT URINALYSIS DIPSTICK, ED / UC
Bilirubin Urine: NEGATIVE
Glucose, UA: NEGATIVE mg/dL
Hgb urine dipstick: NEGATIVE
Ketones, ur: NEGATIVE mg/dL
Leukocytes,Ua: NEGATIVE
Nitrite: NEGATIVE
Protein, ur: NEGATIVE mg/dL
Specific Gravity, Urine: >=1.03 (ref 1.005–1.030)
Urobilinogen, UA: 0.2 mg/dL (ref 0.0–1.0)
pH: 6 (ref 5.0–8.0)

## 2020-09-17 LAB — LIPASE, BLOOD: Lipase: 42 U/L (ref 11–51)

## 2020-09-17 MED ORDER — KETOROLAC TROMETHAMINE 60 MG/2ML IM SOLN
INTRAMUSCULAR | Status: AC
  Filled 2020-09-17: qty 2

## 2020-09-17 MED ORDER — KETOROLAC TROMETHAMINE 60 MG/2ML IM SOLN
60.0000 mg | Freq: Once | INTRAMUSCULAR | Status: AC
  Administered 2020-09-17: 60 mg via INTRAMUSCULAR

## 2020-09-17 NOTE — ED Triage Notes (Signed)
Patient c/o lower ABD pain x 2 days ago.   Patient endorses blood in stool that started this morning. Patient states " I had bright red blood in my stool this morning".   Patient denies denies diarrhea, nausea, vomiting, and dysuria.   Patient stated this feels like a previous episode related to kidney stones.

## 2020-09-17 NOTE — ED Provider Notes (Addendum)
[facility]    CSN: [id number] Arrival date & time: 09/17/20  1505      History   Chief Complaint Chief Complaint  Patient presents with  . Abdominal Pain    HPI Maxwell Faulkner is a 34 y.o. male.   Presenting today with 2 day hx of b/l lower abdominal pain that is strong and cramping in nature. Also had an episode this morning of a tinge of bright red blood on his stool. States that has happened to him several times in the past and he attributes it to straining with BMs. He denies fever, chills, N/V, dysuria, penile discharge or rashes. Has had kidney stones in the past that he states felt similar and has had an episode just like this in the past that was worked up with no findings for cause. Does endorse a very poor quality diet consisting of lots of junk food and that he uses marijuana and cigarettes all day every day. No alcohol use or other substance abuse present, no new medications recently, no new supplements, recent travel or diet changes, sick contacts. Has not tried anything for sxs.      Past Medical History:  Diagnosis Date  . Anxiety   . Depression   . Hypertension     There are no problems to display for this patient.   History reviewed. No pertinent surgical history.     Home Medications    Prior to Admission medications   Medication Sig Start Date End Date Taking? Authorizing Provider  doxycycline (VIBRAMYCIN) 100 MG capsule Take 1 capsule (100 mg total) by mouth 2 (two) times daily. 12/07/19   Georgetta Haber, NP  ibuprofen (ADVIL) 800 MG tablet Take 1 tablet (800 mg total) by mouth every 8 (eight) hours as needed for mild pain or moderate pain. 12/07/19   Georgetta Haber, NP    Family History Family History  Problem Relation Age of Onset  . Hypertension Mother     Social History Social History   Tobacco Use  . Smoking status: Current Every Day Smoker    Packs/day: 1.00    Types: Cigarettes  . Smokeless tobacco: Never Used    Vaping Use  . Vaping Use: Never used  Substance Use Topics  . Alcohol use: No    Comment: stopped drinking  . Drug use: Yes    Types: Marijuana     Allergies   Patient has no known allergies.   Review of Systems Review of Systems PER HPI    Physical Exam Triage Vital Signs ED Triage Vitals  Enc Vitals Group     BP 09/17/20 0930 (!) 130/96     Pulse Rate 09/17/20 0930 82     Resp 09/17/20 0930 12     Temp 09/17/20 0930 97.9 F (36.6 C)     Temp Source 09/17/20 0930 Oral     SpO2 09/17/20 0930 100 %     Weight --      Height --      Head Circumference --      Peak Flow --      Pain Score 09/17/20 0928 4     Pain Loc --      Pain Edu? --      Excl. in GC? --    No data found.  Updated Vital Signs BP (!) 130/96 (BP Location: Right Arm)   Pulse 82   Temp 97.9 F (36.6 C) (Oral)   Resp 12  SpO2 100%   Visual Acuity Right Eye Distance:   Left Eye Distance:   Bilateral Distance:    Right Eye Near:   Left Eye Near:    Bilateral Near:     Physical Exam Vitals and nursing note reviewed.  Constitutional:      Appearance: Normal appearance.  HENT:     Head: Atraumatic.     Nose: Nose normal.     Mouth/Throat:     Mouth: Mucous membranes are moist.     Pharynx: Oropharynx is clear.  Eyes:     Extraocular Movements: Extraocular movements intact.     Conjunctiva/sclera: Conjunctivae normal.  Cardiovascular:     Rate and Rhythm: Normal rate and regular rhythm.  Pulmonary:     Effort: Pulmonary effort is normal.     Breath sounds: Normal breath sounds.  Abdominal:     General: Bowel sounds are normal. There is no distension.     Palpations: Abdomen is soft. There is no mass.     Tenderness: There is abdominal tenderness (diffuse abdominal ttp, worse b/l lower abdomen). There is guarding (diffusely across abdomen). There is no right CVA tenderness or left CVA tenderness.  Musculoskeletal:        General: Normal range of motion.     Cervical back:  Normal range of motion and neck supple.  Skin:    General: Skin is warm and dry.  Neurological:     General: No focal deficit present.     Mental Status: He is oriented to person, place, and time.  Psychiatric:        Mood and Affect: Mood normal.        Thought Content: Thought content normal.        Judgment: Judgment normal.     UC Treatments / Results  Labs (all labs ordered are listed, but only abnormal results are displayed) Labs Reviewed  CBC WITH DIFFERENTIAL/PLATELET - Abnormal; Notable for the following components:      Result Value   RBC 3.92 (*)    Hemoglobin 12.2 (*)    HCT 37.0 (*)    All other components within normal limits  COMPREHENSIVE METABOLIC PANEL  LIPASE, BLOOD  POCT URINALYSIS DIPSTICK, ED / UC    EKG   Radiology No results found.  Procedures Procedures (including critical care time)  Medications Ordered in UC Medications  ketorolac (TORADOL) injection 60 mg (60 mg Intramuscular Given 09/17/20 1031)    Initial Impression / Assessment and Plan / UC Course  I have reviewed the triage vital signs and the nursing notes.  Pertinent labs & imaging results that were available during my care of the patient were reviewed by me and considered in my medical decision making (see chart for details).     Vitals today stable, he is overall well appearing but his abdominal exam shows marked guarding and tenderness to palpation diffusely across abdomen. U/A without evidence of kidney stone, labs pending. Suspect inflamed internal hemorrhoids causing his bloody stools this morning, reviewed prep H, stool regimen, improved diet habits. Regarding his severe abdominal pain, discussed with patient that while overall he appears stable, we are unable to rule out several serious potential causes of his abdominal pain without abdominal imaging which would be available to him in the ED. He does not wish to go to the ED at this time. IM toradol given in clinic, discussed  OTC pain relievers, supportive care, strict return precautions. Did discuss that his heavy marijuana use could  be a contributing factor to his ongoing episodic GI issues but that he should be evaluated by GI specialist given chronicity of issue.   Final Clinical Impressions(s) / UC Diagnoses   Final diagnoses:  Lower abdominal pain  Marijuana use, continuous  Bloody stool   Discharge Instructions   None    ED Prescriptions    None     PDMP not reviewed this encounter.   Particia Nearing, PA-C 09/18/20 1010    Roosvelt Maser [place], [place] 09/18/20 1011

## 2020-10-27 ENCOUNTER — Encounter (HOSPITAL_COMMUNITY): Payer: Self-pay

## 2020-10-27 ENCOUNTER — Other Ambulatory Visit: Payer: Self-pay

## 2020-10-27 ENCOUNTER — Emergency Department (HOSPITAL_COMMUNITY)
Admission: EM | Admit: 2020-10-27 | Discharge: 2020-10-27 | Disposition: A | Discharge status: Home / Self Care | Payer: HRSA Program | Attending: Emergency Medicine | Admitting: Emergency Medicine

## 2020-10-27 DIAGNOSIS — Z5321 Procedure and treatment not carried out due to patient leaving prior to being seen by health care provider: Secondary | ICD-10-CM | POA: Insufficient documentation

## 2020-10-27 DIAGNOSIS — U071 COVID-19: Secondary | ICD-10-CM | POA: Diagnosis not present

## 2020-10-27 DIAGNOSIS — R059 Cough, unspecified: Secondary | ICD-10-CM | POA: Diagnosis present

## 2020-10-27 LAB — RESP PANEL BY RT-PCR (FLU A&B, COVID) ARPGX2
Influenza A by PCR: NEGATIVE
Influenza B by PCR: NEGATIVE
SARS Coronavirus 2 by RT PCR: POSITIVE — AB

## 2020-10-27 MED ORDER — ACETAMINOPHEN 325 MG PO TABS
650.0000 mg | ORAL_TABLET | Freq: Once | ORAL | Status: AC | PRN
  Administered 2020-10-27: 650 mg via ORAL
  Filled 2020-10-27: qty 2

## 2020-10-27 NOTE — ED Notes (Signed)
Pt called 3x for room placement. Eloped from waiting area.  

## 2020-10-27 NOTE — ED Triage Notes (Signed)
Pt reports headache and cough x2 days. Pt has had a recent exposure to COVID. Pt is unvaccinated for COVID.

## 2021-06-09 ENCOUNTER — Encounter (HOSPITAL_COMMUNITY): Payer: Self-pay | Admitting: Emergency Medicine

## 2021-06-09 ENCOUNTER — Ambulatory Visit (INDEPENDENT_AMBULATORY_CARE_PROVIDER_SITE_OTHER): Payer: Self-pay

## 2021-06-09 ENCOUNTER — Other Ambulatory Visit: Payer: Self-pay

## 2021-06-09 ENCOUNTER — Ambulatory Visit (HOSPITAL_COMMUNITY)
Admission: EM | Admit: 2021-06-09 | Discharge: 2021-06-09 | Disposition: A | Discharge status: Home / Self Care | Payer: Self-pay | Attending: Emergency Medicine | Admitting: Emergency Medicine

## 2021-06-09 DIAGNOSIS — R079 Chest pain, unspecified: Secondary | ICD-10-CM

## 2021-06-09 DIAGNOSIS — R1310 Dysphagia, unspecified: Secondary | ICD-10-CM

## 2021-06-09 DIAGNOSIS — R0989 Other specified symptoms and signs involving the circulatory and respiratory systems: Secondary | ICD-10-CM

## 2021-06-09 DIAGNOSIS — K209 Esophagitis, unspecified without bleeding: Secondary | ICD-10-CM

## 2021-06-09 MED ORDER — LIDOCAINE VISCOUS HCL 2 % MT SOLN
OROMUCOSAL | Status: AC
  Filled 2021-06-09: qty 15

## 2021-06-09 MED ORDER — SUCRALFATE 1 GM/10ML PO SUSP: 1.0000 g | Freq: Four times a day (QID) | ORAL | 0 refills | Status: DC

## 2021-06-09 MED ORDER — LIDOCAINE VISCOUS HCL 2 % MT SOLN
15.0000 mL | Freq: Once | OROMUCOSAL | Status: AC
  Administered 2021-06-09: 15 mL via ORAL

## 2021-06-09 MED ORDER — ALUM & MAG HYDROXIDE-SIMETH 200-200-20 MG/5ML PO SUSP
30.0000 mL | Freq: Once | ORAL | Status: AC
  Administered 2021-06-09: 30 mL via ORAL

## 2021-06-09 MED ORDER — ALUM & MAG HYDROXIDE-SIMETH 200-200-20 MG/5ML PO SUSP
ORAL | Status: AC
  Filled 2021-06-09: qty 30

## 2021-06-09 NOTE — Discharge Instructions (Addendum)
Take 5 mL of liquid Benadryl and 5 mL of Maalox. Mix it together, and then hold it in your mouth for as long as you can and then swallow. You may do this 4 times a day.  Or you can use the Carafate.  The Carafate will do the same thing.  Below is a list of primary care practices who are taking new patients for you to follow-up with.  [facility] internal medicine clinic Ground Floor - [facility], [address] [place], [place], [place] [address] [phone]  [facility] [address] [place], [place] [address] [phone]  [facility] [address] [place], [place] [address] [phone]  [facility] [phone] [address] [place] [address]  [facility]: [address] [place] [address]  [phone]  [facility]: [address] [place] [address]  [phone]  [facility] : [address] [place] [address] [phone]  [facility]: [address] [place] [address] [phone]  [facility]: [address] [place] [address] [phone]  Dr. Oneal Grout [address] [facility] [place] [address]  [phone]    Go to www.goodrx.com  or www.costplusdrugs.com to look up your medications. This will give you a list of where you can find your prescriptions at the most affordable prices. Or ask the pharmacist what the cash price is, or if they have any other discount programs available to help make your medication more affordable. This can be less expensive than what you would pay with insurance.

## 2021-06-09 NOTE — ED Triage Notes (Signed)
Pt presents with sore throat and chest tightness. States sore throat started after eating chips yesterday. States hurts to swallow and feels as if something is stuck. States experiences chest pain with swallowing.

## 2021-06-09 NOTE — ED Provider Notes (Signed)
HPI  SUBJECTIVE:  Maxwell Faulkner is a 35 y.o. male who presents with sore throat, pain with swallowing liquids and soft foods, intermittent, hours long substernal chest pain described as soreness starting immediately after eating chips yesterday.  He states that they "got stuck" and experienced pain with it going down.  He has had symptoms since.  He reports pain with deep inspiration and shortness of breath secondary to the pain.  No coughing.  Questionable wheezing.  No drooling, regurgitation, fevers, nausea, vomiting, voice changes. He tried eating soft bread, Pepcid and drinking water without improvement in his symptoms.  Symptoms are worse with swallowing, lying down.  Patient has been smoking half a pack a day for 24 years.  He has a history of hypertension.  No history of pulmonary disease.  PMD: None.  Past Medical History:  Diagnosis Date   Anxiety    Depression    Hypertension     History reviewed. No pertinent surgical history.  Family History  Problem Relation Age of Onset   Hypertension Mother     Social History   Tobacco Use   Smoking status: Every Day    Packs/day: 1.00    Types: Cigarettes   Smokeless tobacco: Never  Vaping Use   Vaping Use: Never used  Substance Use Topics   Alcohol use: No    Comment: stopped drinking   Drug use: Yes    Types: Marijuana    No current facility-administered medications for this encounter.  Current Outpatient Medications:    sucralfate (CARAFATE) 1 GM/10ML suspension, Take 10 mLs (1 g total) by mouth 4 (four) times daily. 10 mL before meals and before bedtime, Disp: 240 mL, Rfl: 0  No Known Allergies   ROS  As noted in HPI.   Physical Exam  BP 116/87 (BP Location: Right Arm)   Pulse 88   Temp 98.6 F (37 C) (Oral)   Resp 16   SpO2 100%   Constitutional: Well developed, well nourished, no acute distress Eyes:  EOMI, conjunctiva normal bilaterally HENT: Normocephalic, atraumatic,mucus membranes moist.  No  foreign body seen in oropharynx.  No drooling, trismus. Respiratory: Normal inspiratory effort, lungs clear bilaterally. Cardiovascular: Normal rate, regular rhythm, no murmurs rubs or gallops.  Positive chest wall tenderness along the sternum.  No subcutaneous crepitus. GI: nondistended skin: No rash, skin intact Musculoskeletal: no deformities Neurologic: Alert & oriented x 3, no focal neuro deficits Psychiatric: Speech and behavior appropriate   ED Course   Medications  alum & mag hydroxide-simeth (MAALOX/MYLANTA) 200-200-20 MG/5ML suspension 30 mL (30 mLs Oral Given 06/09/21 2002)    And  lidocaine (XYLOCAINE) 2 % viscous mouth solution 15 mL (15 mLs Oral Given 06/09/21 2004)    Orders Placed This Encounter  Procedures   DG Chest 2 View    Standing Status:   Standing    Number of Occurrences:   1    Order Specific Question:   Reason for Exam (SYMPTOM  OR DIAGNOSIS REQUIRED)    Answer:   CP r/o FB, pneumomediastunum    No results found for this or any previous visit (from the past 24 hour(s)). DG Chest 2 View  Result Date: 06/09/2021 CLINICAL DATA:  Chest pain.  Feels like something stuck in throat. EXAM: CHEST - 2 VIEW COMPARISON:  None. FINDINGS: Bullous emphysematous changes in the apices. Heart and mediastinal contours are within normal limits. No focal opacities or effusions. No acute bony abnormality. No pneumomediastinum. IMPRESSION: Bullous emphysema.  No  acute cardiopulmonary disease. Electronically Signed   By: Charlett Nose M.D.   On: 06/09/2021 20:18    ED Clinical Impression  1. Acute esophagitis      ED Assessment/Plan  Patient with chest pain status post swallowing sharp chips.  Suspect esophageal abrasion.  Will check a chest x-ray to rule out pneumomediastinum, as patient seems uncomfortable.  Doubt ACS.  We will also give GI cocktail.  There is no drooling, trismus.  Home with Carafate or Benadryl/Maalox mixture.  Will provide primary care list and order  assistance in finding a PMD.  Return precautions given  Reviewed imaging independently.  Bullous emphysema per radiology.  No Pneumomediastinum, pneumothorax.  See radiology report for full details.  On reevaluation, patient states that he feels better.  No acute changes on chest x-ray.  Discussed findings of emphysema with patient.  Advised him to quit smoking. I do not think this is causing his symptoms today.  Will treat as an esophagitis with Benadryl/Maalox mixture or Carafate if that does not work.  Soft foods.  Follow-up with PMD of choice as needed.  ER return precautions given.  Will provide primary care list and order assistance in finding a PMD.  Discussed imaging, MDM, treatment plan, and plan for follow-up with patient. Discussed sn/sx that should prompt return to the ED. patient agrees with plan.   Meds ordered this encounter  Medications   AND Linked Order Group    alum & mag hydroxide-simeth (MAALOX/MYLANTA) 200-200-20 MG/5ML suspension 30 mL    lidocaine (XYLOCAINE) 2 % viscous mouth solution 15 mL   sucralfate (CARAFATE) 1 GM/10ML suspension    Sig: Take 10 mLs (1 g total) by mouth 4 (four) times daily. 10 mL before meals and before bedtime    Dispense:  240 mL    Refill:  0      *This clinic note was created using Scientist, clinical (histocompatibility and immunogenetics). Therefore, there may be occasional mistakes despite careful proofreading.  ?    Domenick Gong, MD 06/09/21 2040

## 2021-09-23 ENCOUNTER — Emergency Department (HOSPITAL_COMMUNITY): Payer: Self-pay

## 2021-09-23 ENCOUNTER — Emergency Department (HOSPITAL_COMMUNITY)
Admission: EM | Admit: 2021-09-23 | Discharge: 2021-09-23 | Disposition: A | Discharge status: Home / Self Care | Payer: Self-pay | Attending: Emergency Medicine | Admitting: Emergency Medicine

## 2021-09-23 ENCOUNTER — Other Ambulatory Visit: Payer: Self-pay

## 2021-09-23 DIAGNOSIS — N1330 Unspecified hydronephrosis: Secondary | ICD-10-CM | POA: Insufficient documentation

## 2021-09-23 DIAGNOSIS — N202 Calculus of kidney with calculus of ureter: Secondary | ICD-10-CM | POA: Insufficient documentation

## 2021-09-23 DIAGNOSIS — N200 Calculus of kidney: Secondary | ICD-10-CM

## 2021-09-23 DIAGNOSIS — I10 Essential (primary) hypertension: Secondary | ICD-10-CM | POA: Insufficient documentation

## 2021-09-23 DIAGNOSIS — F17210 Nicotine dependence, cigarettes, uncomplicated: Secondary | ICD-10-CM | POA: Insufficient documentation

## 2021-09-23 LAB — URINALYSIS, ROUTINE W REFLEX MICROSCOPIC
Bacteria, UA: NONE SEEN
Bilirubin Urine: NEGATIVE
Glucose, UA: NEGATIVE mg/dL
Ketones, ur: 5 mg/dL — AB
Leukocytes,Ua: NEGATIVE
Nitrite: NEGATIVE
Protein, ur: 30 mg/dL — AB
RBC / HPF: >50 RBC/hpf — ABNORMAL HIGH (ref 0–5)
Specific Gravity, Urine: 1.02 (ref 1.005–1.030)
pH: 7 (ref 5.0–8.0)

## 2021-09-23 LAB — COMPREHENSIVE METABOLIC PANEL
ALT: 11 U/L (ref 0–44)
AST: 20 U/L (ref 15–41)
Albumin: 4.4 g/dL (ref 3.5–5.0)
Alkaline Phosphatase: 61 U/L (ref 38–126)
Anion gap: 8 (ref 5–15)
BUN: 11 mg/dL (ref 6–20)
CO2: 27 mmol/L (ref 22–32)
Calcium: 9.5 mg/dL (ref 8.9–10.3)
Chloride: 106 mmol/L (ref 98–111)
Creatinine, Ser: 0.77 mg/dL (ref 0.61–1.24)
GFR, Estimated: >60 mL/min (ref >60)
Glucose, Bld: 118 mg/dL — ABNORMAL HIGH (ref 70–99)
Potassium: 3.7 mmol/L (ref 3.5–5.1)
Sodium: 141 mmol/L (ref 135–145)
Total Bilirubin: 0.5 mg/dL (ref 0.3–1.2)
Total Protein: 7.8 g/dL (ref 6.5–8.1)

## 2021-09-23 LAB — CBC WITH DIFFERENTIAL/PLATELET
Abs Immature Granulocytes: 0.03 10*3/uL (ref 0.00–0.07)
Basophils Absolute: 0.1 10*3/uL (ref 0.0–0.1)
Basophils Relative: 1 %
Eosinophils Absolute: 0 10*3/uL (ref 0.0–0.5)
Eosinophils Relative: 0 %
HCT: 39.2 % (ref 39.0–52.0)
Hemoglobin: 13.2 g/dL (ref 13.0–17.0)
Immature Granulocytes: 0 %
Lymphocytes Relative: 20 %
Lymphs Abs: 1.8 10*3/uL (ref 0.7–4.0)
MCH: 31.4 pg (ref 26.0–34.0)
MCHC: 33.7 g/dL (ref 30.0–36.0)
MCV: 93.1 fL (ref 80.0–100.0)
Monocytes Absolute: 0.5 10*3/uL (ref 0.1–1.0)
Monocytes Relative: 5 %
Neutro Abs: 6.8 10*3/uL (ref 1.7–7.7)
Neutrophils Relative %: 74 %
Platelets: 227 10*3/uL (ref 150–400)
RBC: 4.21 MIL/uL — ABNORMAL LOW (ref 4.22–5.81)
RDW: 13.2 % (ref 11.5–15.5)
WBC: 9.2 10*3/uL (ref 4.0–10.5)
nRBC: 0 % (ref 0.0–0.2)

## 2021-09-23 LAB — LIPASE, BLOOD: Lipase: 36 U/L (ref 11–51)

## 2021-09-23 MED ORDER — SODIUM CHLORIDE 0.9 % IV BOLUS
1000.0000 mL | Freq: Once | INTRAVENOUS | Status: AC
  Administered 2021-09-23: 1000 mL via INTRAVENOUS

## 2021-09-23 MED ORDER — ONDANSETRON 4 MG PO TBDP: ORAL_TABLET | ORAL | 0 refills | Status: DC

## 2021-09-23 MED ORDER — IOHEXOL 350 MG/ML SOLN
80.0000 mL | Freq: Once | INTRAVENOUS | Status: AC | PRN
  Administered 2021-09-23: 80 mL via INTRAVENOUS

## 2021-09-23 MED ORDER — ONDANSETRON HCL 4 MG/2ML IJ SOLN
4.0000 mg | Freq: Once | INTRAMUSCULAR | Status: AC
  Administered 2021-09-23: 4 mg via INTRAVENOUS
  Filled 2021-09-23: qty 2

## 2021-09-23 MED ORDER — MORPHINE SULFATE 15 MG PO TABS: 7.5000 mg | ORAL_TABLET | ORAL | 0 refills | Status: DC | PRN

## 2021-09-23 MED ORDER — MORPHINE SULFATE (PF) 4 MG/ML IV SOLN
4.0000 mg | Freq: Once | INTRAVENOUS | Status: AC
  Administered 2021-09-23: 4 mg via INTRAVENOUS
  Filled 2021-09-23: qty 1

## 2021-09-23 MED ORDER — KETOROLAC TROMETHAMINE 15 MG/ML IJ SOLN
15.0000 mg | Freq: Once | INTRAMUSCULAR | Status: AC
  Administered 2021-09-23: 15 mg via INTRAVENOUS
  Filled 2021-09-23: qty 1

## 2021-09-23 NOTE — ED Triage Notes (Addendum)
Patient BIBA. Patient complaining of 10/10 abd pain that began at 0400 today. Patient states pain is similar to last time they had kidney stones.  No frequency, urgency, or dysuria noted.  BP: 138/80 HR: 56 RR: 16 SPO2: 98% RA

## 2021-09-23 NOTE — ED Provider Notes (Signed)
[facility]-EMERGENCY DEPT Provider Note   CSN: [id number] Arrival date & time: 09/23/21  1601     History Chief Complaint  Patient presents with   Abdominal Pain    Maxwell Faulkner is a 35 y.o. male.  35 yo M with a cc of diffuse abdominal pain.  Going on for the past 7 hours.  Sharp severe, diffuse.  No fevers or chills 1 episode of nausea but no vomiting.  Denies diarrhea.  Has had a normal bowel movement this morning but did not improve his symptoms.  He thinks he feels similar to his kidney stones.  Feels better when he lies still worse when he moves or pushes on the area.  Feels like it is worse on the right than the left.  The history is provided by the patient.  Abdominal Pain Pain location:  Generalized Pain quality: sharp and shooting   Pain radiates to:  Does not radiate Pain severity:  No pain Onset quality:  Gradual Duration:  7 hours Timing:  Constant Progression:  Worsening Chronicity:  New Relieved by:  Nothing Worsened by:  Nothing Ineffective treatments:  None tried Associated symptoms: no chest pain, no chills, no diarrhea, no fever, no shortness of breath and no vomiting       Past Medical History:  Diagnosis Date   Anxiety    Depression    Hypertension     There are no problems to display for this patient.   No past surgical history on file.     Family History  Problem Relation Age of Onset   Hypertension Mother     Social History   Tobacco Use   Smoking status: Every Day    Packs/day: 1.00    Types: Cigarettes   Smokeless tobacco: Never  Vaping Use   Vaping Use: Never used  Substance Use Topics   Alcohol use: No    Comment: stopped drinking   Drug use: Yes    Types: Marijuana    Home Medications Prior to Admission medications   Medication Sig Start Date End Date Taking? Authorizing Provider  ibuprofen (ADVIL) 200 MG tablet Take 200 mg by mouth every 6 (six) hours as needed for moderate pain.   Yes  [provider]  morphine (MSIR) 15 MG tablet Take 0.5 tablets (7.5 mg total) by mouth every 4 (four) hours as needed for severe pain. 09/23/21  Yes Melene Plan, DO  ondansetron (ZOFRAN-ODT) 4 MG disintegrating tablet 4mg  ODT q4 hours prn nausea/vomit 09/23/21  Yes 09/25/21, DO  sucralfate (CARAFATE) 1 GM/10ML suspension Take 10 mLs (1 g total) by mouth 4 (four) times daily. 10 mL before meals and before bedtime Patient not taking: Reported on 09/23/2021 06/09/21   08/09/21, MD    Allergies    Patient has no known allergies.  Review of Systems   Review of Systems  Constitutional:  Negative for chills and fever.  HENT:  Negative for congestion and facial swelling.   Eyes:  Negative for discharge and visual disturbance.  Respiratory:  Negative for shortness of breath.   Cardiovascular:  Negative for chest pain and palpitations.  Gastrointestinal:  Positive for abdominal pain. Negative for diarrhea and vomiting.  Musculoskeletal:  Negative for arthralgias and myalgias.  Skin:  Negative for color change and rash.  Neurological:  Negative for tremors, syncope and headaches.  Psychiatric/Behavioral:  Negative for confusion and dysphoric mood.    Physical Exam Updated Vital Signs BP 129/84   Pulse  60   Temp 97.8 F (36.6 C) (Oral)   Resp 18   SpO2 100%   Physical Exam Vitals and nursing note reviewed.  Constitutional:      Appearance: He is well-developed.  HENT:     Head: Normocephalic and atraumatic.  Eyes:     Pupils: Pupils are equal, round, and reactive to light.  Neck:     Vascular: No JVD.  Cardiovascular:     Rate and Rhythm: Normal rate and regular rhythm.     Heart sounds: No murmur heard.   No friction rub. No gallop.  Pulmonary:     Effort: No respiratory distress.     Breath sounds: No wheezing.  Abdominal:     General: There is no distension.     Tenderness: There is abdominal tenderness. There is no guarding or rebound.     Comments: Pain  out of proportion to abdominal exam diffuse abdominal discomfort.  No obvious focality for me that patient subjectively states that it is worse on the right than the left.  Musculoskeletal:        General: Normal range of motion.     Cervical back: Normal range of motion and neck supple.  Skin:    Coloration: Skin is not pale.     Findings: No rash.  Neurological:     Mental Status: He is alert and oriented to person, place, and time.  Psychiatric:        Behavior: Behavior normal.    ED Results / Procedures / Treatments   Labs (all labs ordered are listed, but only abnormal results are displayed) Labs Reviewed  CBC WITH DIFFERENTIAL/PLATELET - Abnormal; Notable for the following components:      Result Value   RBC 4.21 (*)    All other components within normal limits  COMPREHENSIVE METABOLIC PANEL - Abnormal; Notable for the following components:   Glucose, Bld 118 (*)    All other components within normal limits  URINALYSIS, ROUTINE W REFLEX MICROSCOPIC - Abnormal; Notable for the following components:   APPearance HAZY (*)    Hgb urine dipstick LARGE (*)    Ketones, ur 5 (*)    Protein, ur 30 (*)    RBC / HPF >50 (*)    All other components within normal limits  LIPASE, BLOOD    EKG None  Radiology CT ABDOMEN PELVIS W CONTRAST  Result Date: 09/23/2021 CLINICAL DATA:  Acute onset of severe right lower quadrant pain this morning. Clinical suspicion for appendicitis. EXAM: CT ABDOMEN AND PELVIS WITH CONTRAST TECHNIQUE: Multidetector CT imaging of the abdomen and pelvis was performed using the standard protocol following bolus administration of intravenous contrast. CONTRAST:  32mL OMNIPAQUE IOHEXOL 350 MG/ML SOLN COMPARISON:  03/29/2019 FINDINGS: Lower Chest: No acute findings. Hepatobiliary: No hepatic masses identified. Gallbladder is unremarkable. No evidence of biliary ductal dilatation. Pancreas:  No mass or inflammatory changes. Spleen: Within normal limits in size and  appearance. Adrenals/Urinary Tract: No masses identified. Several small benign-appearing left renal cysts are noted. Mild right hydronephrosis is seen due to a 2 mm calculus in the proximal right ureter. Unremarkable unopacified urinary bladder. Stomach/Bowel: No evidence of obstruction, inflammatory process or abnormal fluid collections. Normal appendix visualized. Large colonic stool burden again noted. Vascular/Lymphatic: No pathologically enlarged lymph nodes. No acute vascular findings. Reproductive:  No mass or other significant abnormality. Other:  None. Musculoskeletal:  No suspicious bone lesions identified. IMPRESSION: Mild right hydronephrosis due to 2 mm proximal right ureteral calculus. No  evidence of appendicitis. Large stool burden again noted; recommend clinical correlation for possible constipation. Electronically Signed   By: Marlaine Hind M.D.   On: 09/23/2021 12:26    Procedures Procedures   Medications Ordered in ED Medications  sodium chloride 0.9 % bolus 1,000 mL (1,000 mLs Intravenous New Bag/Given 09/23/21 1108)  morphine 4 MG/ML injection 4 mg (4 mg Intravenous Given 09/23/21 1107)  ondansetron (ZOFRAN) injection 4 mg (4 mg Intravenous Given 09/23/21 1107)  iohexol (OMNIPAQUE) 350 MG/ML injection 80 mL (80 mLs Intravenous Contrast Given 09/23/21 1203)  ketorolac (TORADOL) 15 MG/ML injection 15 mg (15 mg Intravenous Given 09/23/21 1248)  morphine 4 MG/ML injection 4 mg (4 mg Intravenous Given 09/23/21 1307)  ondansetron (ZOFRAN) injection 4 mg (4 mg Intravenous Given 09/23/21 1307)    ED Course  I have reviewed the triage vital signs and the nursing notes.  Pertinent labs & imaging results that were available during my care of the patient were reviewed by me and considered in my medical decision making (see chart for details).    MDM Rules/Calculators/A&P                           35 yo M with a chief complaints of diffuse abdominal discomfort going on since this  morning.  Has had nausea but no vomiting denies any issues with constipation or diarrhea.  He appears well however has much more tenderness on exam than I would expect.  Looking back at his prior ED visits it does seem that he presents somewhat dramatically often requiring CT imaging.  He does have a history of a kidney stone no prior intervention was required but with the patient presenting somewhat atypically of a kidney stone for me and with more severe pain than I would expect will obtain a CT scan.  Treat pain and nausea reassess.  CT scan with a right proximal kidney stone.  Patient pain improved but not well controlled on repeat assessment.  UA with hematuria without infection.  Given a second dose of morphine as well as Toradol with improvement.  Will discharge home.  Urology follow-up.  2:09 PM:  I have discussed the diagnosis/risks/treatment options with the patient and believe the pt to be eligible for discharge home to follow-up with Urology. We also discussed returning to the ED immediately if new or worsening sx occur. We discussed the sx which are most concerning (e.g., sudden worsening pain, fever, inability to tolerate by mouth) that necessitate immediate return. Medications administered to the patient during their visit and any new prescriptions provided to the patient are listed below.  Medications given during this visit Medications  sodium chloride 0.9 % bolus 1,000 mL (1,000 mLs Intravenous New Bag/Given 09/23/21 1108)  morphine 4 MG/ML injection 4 mg (4 mg Intravenous Given 09/23/21 1107)  ondansetron (ZOFRAN) injection 4 mg (4 mg Intravenous Given 09/23/21 1107)  iohexol (OMNIPAQUE) 350 MG/ML injection 80 mL (80 mLs Intravenous Contrast Given 09/23/21 1203)  ketorolac (TORADOL) 15 MG/ML injection 15 mg (15 mg Intravenous Given 09/23/21 1248)  morphine 4 MG/ML injection 4 mg (4 mg Intravenous Given 09/23/21 1307)  ondansetron (ZOFRAN) injection 4 mg (4 mg Intravenous Given  09/23/21 1307)     The patient appears reasonably screen and/or stabilized for discharge and I doubt any other medical condition or other [facility] requiring further screening, evaluation, or treatment in the ED at this time prior to discharge.   Final Clinical Impression(s) /  ED Diagnoses Final diagnoses:  Nephrolithiasis    Rx / DC Orders ED Discharge Orders          Ordered    morphine (MSIR) 15 MG tablet  Every 4 hours PRN        09/23/21 1403    ondansetron (ZOFRAN-ODT) 4 MG disintegrating tablet        09/23/21 [place], Gery Sabedra, DO 09/23/21 1409

## 2021-09-23 NOTE — Discharge Instructions (Signed)

## 2022-02-14 ENCOUNTER — Ambulatory Visit (HOSPITAL_COMMUNITY): Payer: Medicaid Other

## 2022-02-15 ENCOUNTER — Ambulatory Visit (HOSPITAL_COMMUNITY)
Admission: EM | Admit: 2022-02-15 | Discharge: 2022-02-15 | Disposition: A | Discharge status: Home / Self Care | Payer: Medicaid Other | Attending: Emergency Medicine | Admitting: Emergency Medicine

## 2022-02-15 ENCOUNTER — Encounter (HOSPITAL_COMMUNITY): Payer: Self-pay

## 2022-02-15 DIAGNOSIS — H6123 Impacted cerumen, bilateral: Secondary | ICD-10-CM

## 2022-02-15 NOTE — ED Triage Notes (Signed)
Pt presents with rt ear pain x 2 days ?

## 2022-02-15 NOTE — Discharge Instructions (Signed)
Today your ears were cleaned by nursing staff and they were able to remove all wax, there were no signs of infection on reexamination ? ?Moving for you may use over-the-counter Debrox drops 1-2 times weekly which will help thin out your earwax and allow you to clean them easier with a cottonball instead of a Q-tip ? ?You may return to the urgent care as needed if your symptoms recur ?

## 2022-02-16 ENCOUNTER — Ambulatory Visit (HOSPITAL_COMMUNITY): Payer: Medicaid Other

## 2022-02-16 NOTE — ED Provider Notes (Signed)
?[facility] ? ? ? ?CSN: [id number] ?Arrival date & time: 02/15/22  1848 ? ? ?  ? ?History   ?Chief Complaint ?Chief Complaint  ?Patient presents with  ? Otalgia  ? ? ?HPI ?Maxwell Faulkner is a 36 y.o. male.  ? ?Patient presents with right-sided ear pain for 2 days.  Denies ear itching, discharge, decreased hearing, fever, chills, URI symptoms.  Has not attempted treatment of symptoms.  Endorses history of cerumen impaction. ? ?Past Medical History:  ?Diagnosis Date  ? Anxiety   ? Depression   ? Hypertension   ? ? ?There are no problems to display for this patient. ? ? ?History reviewed. No pertinent surgical history. ? ? ? ? ?Home Medications   ? ?Prior to Admission medications   ?Medication Sig Start Date End Date Taking? Authorizing Provider  ?ibuprofen (ADVIL) 200 MG tablet Take 200 mg by mouth every 6 (six) hours as needed for moderate pain. ?Patient not taking: Reported on 02/15/2022    [provider]  ?morphine (MSIR) 15 MG tablet Take 0.5 tablets (7.5 mg total) by mouth every 4 (four) hours as needed for severe pain. ?Patient not taking: Reported on 02/15/2022 09/23/21   Melene Plan, DO  ?ondansetron (ZOFRAN-ODT) 4 MG disintegrating tablet 4mg  ODT q4 hours prn nausea/vomit ?Patient not taking: Reported on 02/15/2022 09/23/21   09/25/21, DO  ?sucralfate (CARAFATE) 1 GM/10ML suspension Take 10 mLs (1 g total) by mouth 4 (four) times daily. 10 mL before meals and before bedtime ?Patient not taking: Reported on 09/23/2021 06/09/21   08/09/21, MD  ? ? ?Family History ?Family History  ?Problem Relation Age of Onset  ? Hypertension Mother   ? ? ?Social History ?Social History  ? ?Tobacco Use  ? Smoking status: Every Day  ?  Packs/day: 1.00  ?  Types: Cigarettes  ? Smokeless tobacco: Never  ?Vaping Use  ? Vaping Use: Never used  ?Substance Use Topics  ? Alcohol use: No  ?  Comment: stopped drinking  ? Drug use: Yes  ?  Types: Marijuana  ? ? ? ?Allergies   ?Patient has no known  allergies. ? ? ?Review of Systems ?Review of Systems  ?Constitutional: Negative.   ?HENT:  Positive for ear pain. Negative for congestion, dental problem, drooling, ear discharge, facial swelling, hearing loss, mouth sores, nosebleeds, postnasal drip, rhinorrhea, sinus pressure, sinus pain, sneezing, sore throat, tinnitus, trouble swallowing and voice change.   ?Respiratory: Negative.    ?Cardiovascular: Negative.   ?Skin: Negative.   ? ? ?Physical Exam ?Triage Vital Signs ?ED Triage Vitals  ?Enc Vitals Group  ?   BP 02/15/22 1917 114/74  ?   Pulse Rate 02/15/22 1917 82  ?   Resp 02/15/22 1917 14  ?   Temp 02/15/22 1917 99.3 ?F (37.4 ?C)  ?   Temp Source 02/15/22 1917 Oral  ?   SpO2 02/15/22 1917 97 %  ?   Weight --   ?   Height --   ?   Head Circumference --   ?   Peak Flow --   ?   Pain Score 02/15/22 1918 8  ?   Pain Loc --   ?   Pain Edu? --   ?   Excl. in GC? --   ? ?No data found. ? ?Updated Vital Signs ?BP 114/74 (BP Location: Right Arm)   Pulse 82   Temp 99.3 ?F (37.4 ?C) (Oral)   Resp 14  SpO2 97%  ? ?Visual Acuity ?Right Eye Distance:   ?Left Eye Distance:   ?Bilateral Distance:   ? ?Right Eye Near:   ?Left Eye Near:    ?Bilateral Near:    ? ?Physical Exam ?Constitutional:   ?   Appearance: Normal appearance.  ?HENT:  ?   Head: Normocephalic.  ?   Right Ear: There is impacted cerumen.  ?   Left Ear: There is impacted cerumen.  ?   Nose: Nose normal.  ?   Mouth/Throat:  ?   Mouth: Mucous membranes are moist.  ?   Pharynx: Oropharynx is clear.  ?Eyes:  ?   Extraocular Movements: Extraocular movements intact.  ?Pulmonary:  ?   Effort: Pulmonary effort is normal.  ?   Breath sounds: Normal breath sounds.  ?Neurological:  ?   Mental Status: He is alert and oriented to person, place, and time. Mental status is at baseline.  ?Psychiatric:     ?   Mood and Affect: Mood normal.     ?   Behavior: Behavior normal.  ? ? ? ?UC Treatments / Results  ?Labs ?(all labs ordered are listed, but only abnormal results are  displayed) ?Labs Reviewed - No data to display ? ?EKG ? ? ?Radiology ?No results found. ? ?Procedures ?Procedures (including critical care time) ? ?Medications Ordered in UC ?Medications - No data to display ? ?Initial Impression / Assessment and Plan / UC Course  ?I have reviewed the triage vital signs and the nursing notes. ? ?Pertinent labs & imaging results that were available during my care of the patient were reviewed by me and considered in my medical decision making (see chart for details). ? ?Bilateral impacted cerumen ? ?Bilateral ear washing completed by nursing staff, on reevaluation, ear canal and tympanic membrane are without abnormality and no signs of infection, recommended Debrox drops use prophylactically moving forward, may follow-up with urgent care as needed ?Final Clinical Impressions(s) / UC Diagnoses  ? ?Final diagnoses:  ?Bilateral impacted cerumen  ? ? ? ?Discharge Instructions   ? ?  ?Today your ears were cleaned by nursing staff and they were able to remove all wax, there were no signs of infection on reexamination ? ?Moving for you may use over-the-counter Debrox drops 1-2 times weekly which will help thin out your earwax and allow you to clean them easier with a cottonball instead of a Q-tip ? ?You may return to the urgent care as needed if your symptoms recur ? ? ?ED Prescriptions   ?None ?  ? ?PDMP not reviewed this encounter. ?  ?Valinda Hoar, NP ?02/16/22 [id number] ? ?

## 2022-05-10 ENCOUNTER — Telehealth: Payer: Self-pay | Admitting: Physician Assistant

## 2022-05-10 DIAGNOSIS — K047 Periapical abscess without sinus: Secondary | ICD-10-CM

## 2022-05-10 DIAGNOSIS — K0889 Other specified disorders of teeth and supporting structures: Secondary | ICD-10-CM

## 2022-05-10 MED ORDER — IBUPROFEN 600 MG PO TABS: 600.0000 mg | ORAL_TABLET | Freq: Three times a day (TID) | ORAL | 0 refills | Status: DC | PRN

## 2022-05-10 MED ORDER — AMOXICILLIN 500 MG PO CAPS: 500.0000 mg | ORAL_CAPSULE | Freq: Three times a day (TID) | ORAL | 0 refills | Status: AC

## 2022-05-10 NOTE — Progress Notes (Signed)

## 2022-06-01 ENCOUNTER — Ambulatory Visit (HOSPITAL_COMMUNITY)
Admission: EM | Admit: 2022-06-01 | Discharge: 2022-06-01 | Disposition: A | Discharge status: Home / Self Care | Payer: No Payment, Other | Attending: Nurse Practitioner | Admitting: Nurse Practitioner

## 2022-06-01 DIAGNOSIS — F4323 Adjustment disorder with mixed anxiety and depressed mood: Secondary | ICD-10-CM | POA: Insufficient documentation

## 2022-06-01 DIAGNOSIS — F330 Major depressive disorder, recurrent, mild: Secondary | ICD-10-CM | POA: Insufficient documentation

## 2022-06-01 NOTE — ED Triage Notes (Signed)
Pt presents to [facility] voluntarily, unaccompanied with complaints of depression and feeling like "everyone is out to get me". Pt is tearful at times and reports that he has not seen his children in a long time (specifically his daughter) and he is having a difficult time dealing with it. Pt reports that he does not want his other children to feel unimportant so he does his best to hide his feelings. Pt reports having feelings of depression for about 6 months. Pt states " I can't just let people see me like this". Pt admits to dealing with his emotions privately and it is taking a toll on him. Pt denies SI, HI, AVH at this time.

## 2022-06-01 NOTE — Discharge Instructions (Addendum)

## 2022-06-01 NOTE — ED Provider Notes (Signed)
Behavioral Health Urgent Care Medical Screening Exam  Patient Name: DIANE MOCHIZUKI MRN: [id number] Date of Evaluation: 06/01/22 Chief Complaint:  " I'm just tired and going through this depression everyday" Diagnosis:  Final diagnoses:  Mild episode of recurrent major depressive disorder (HCC)  Adjustment disorder with mixed anxiety and depressed mood    History of Present illness: ARSHAWN VALDEZ is a 36 y.o. male with past psychiatric history of depression and anxiety, presenting to [facility] voluntarily as a walk-in, unaccompanied with complaints of depression for 11 years, and worsened over the past six months because he is unable to get in touch with his 52 year old daughter because her mother would not let him see her.   Pt reports he does not know where his daughter lives or how to find her. Patient describes having trouble with his "baby mamas",  moreso with the mother of this particular child, citing "lots of fights", and not wanting to "deal with them anymore, just his kids". " I don't trust them and I feel like they are out to get me". Pt does not elaborate on what he means by "they are out to get me". Pt reports "I take care of my kids,but I don't get no text messages or phone calls from this baby mama". Pt reports feeling paranoid about the situation at times, but denies any ideas of reference or racing/disorganized thoughts.   Pt is tearful at times and reports that he sees two of his children, but is not in communication with his 34 year old daughter and he is having a difficult time dealing with it. Pt reports that he does not want his other children to feel unimportant so he does his best to hide his feelings.  Pt reports being depressed and anxious about this situation. Pt reports " I'm up all day and night with two phones in my hand waiting for a call or facebook message from my daughter". Pt states " I can't just let people see me like this". Pt admits to dealing with his emotions  privately and it is taking a toll on him. Pt denies SI, HI, AVH at this time.   Patient endorses depressive symptoms, including low mood, sleep alteration, loss of interest in pleasurable activities, feelings of guilt/worthlessness/hopelessness, problems with energy, problems with concentration, appetite disturbance for the past six months.   Patient endorses symptoms of generalized anxiety, including excessive worrying throughout the day, associated with, restlessness, easy fatigability, difficulty concentration, irritability, muscle tension and disrupted sleep for six months.  Pt reports " a while ago, before I got into a lot of trouble in 2016 and went to Prison, I was put on some medications for depression and anxiety for about six months and also took these meds while in jail and prison until I got out. Pt is unable to recall the antidepressant or anxiolytic medication he was taking at the time.  Pt reports " when I was on medication for depression and anxiety, I didn't think about it that much, but now I cant sleep, I think about her, her, all the time, Im just tired of being up all day waiting on something for nothing".   Pt reports he does not see any mental health provider and does not currently take any psychiatric medications. Pt reports he left his job a year ago and does not currently work. Pt reports he lives by himself and communicates with his mother who is his support system. Pt denies acces or means  to firearms or other harmful weapons at home. Pt verbally contracts for safety and feels safe returning home.   Pt endorses THC use, stating " here and there, and its the only drug I use". Pt denies other illicit drug use. Pt endorses use of alcohol " sometimes". Pt reports his sleep and appetite as poor.   On evaluation, patient is alert, oriented x 3, and cooperative. Speech is clear and coherent. Pt appears appropriate for the environment. Eye contact is fair. Mood is anxious and  depressed, affect is congruent with mood. Thought process is logical and thought content is coherent. Pt denies SI/HI/AVH. There is no indication that the patient is responding to internal stimuli. No delusions elicited during this assessment.    Pt offered support, encouragement, and reassurance about ongoing stressors. Pt provided with opportunity for questions. Pt verbally contracts for safety and reports feeling safe returning home with outpatient psychiatric resources for medication management and therapy.   Psychiatric Specialty Exam  Presentation  General Appearance:Appropriate for Environment  Eye Contact:Fair  Speech:Clear and Coherent  Speech Volume:Normal  Handedness:Right   Mood and Affect  Mood:Anxious; Depressed  Affect:Congruent; Tearful   Thought Process  Thought Processes:Coherent  Descriptions of Associations:Intact  Orientation:Full (Time, Place and Person)  Thought Content:Logical    Hallucinations:None  Ideas of Reference:None  Suicidal Thoughts:No  Homicidal Thoughts:No   Sensorium  Memory:Immediate Fair; Recent Fair  Judgment:Fair  Insight:Fair   Executive Functions  Concentration:Fair  Attention Span:Fair  Recall:Fair  Fund of Knowledge:Fair  Language:Fair   Psychomotor Activity  Psychomotor Activity:Normal   Assets  Assets:Communication Skills; Desire for Improvement; Housing; Social Support   Sleep  Sleep:Poor  Number of hours: No data recorded  Nutritional Assessment (For OBS and FBC admissions only) Has the patient had a weight loss or gain of 10 pounds or more in the last 3 months?: No Has the patient had a decrease in food intake/or appetite?: No Does the patient have dental problems?: No Does the patient have eating habits or behaviors that may be indicators of an eating disorder including binging or inducing vomiting?: No Has the patient recently lost weight without trying?: 0 Has the patient been eating  poorly because of a decreased appetite?: 0 Malnutrition Screening Tool Score: 0    Physical Exam: Physical Exam Constitutional:      General: He is not in acute distress.    Appearance: He is normal weight. He is not diaphoretic.  HENT:     Head: Normocephalic.     Right Ear: External ear normal.     Left Ear: External ear normal.     Nose: No congestion.  Eyes:     General:        Right eye: No discharge.        Left eye: No discharge.  Pulmonary:     Effort: No respiratory distress.  Chest:     Chest wall: No tenderness.  Neurological:     Mental Status: He is oriented to person, place, and time.  Psychiatric:        Attention and Perception: Attention and perception normal.        Mood and Affect: Mood is anxious and depressed.        Speech: Speech normal.        Behavior: Behavior is cooperative.        Thought Content: Thought content is paranoid. Thought content is not delusional. Thought content does not include homicidal or suicidal ideation. Thought content does  not include homicidal or suicidal plan.        Cognition and Memory: Cognition and memory normal.        Judgment: Judgment normal.    Review of Systems  Constitutional:  Negative for chills, diaphoresis and fever.  HENT:  Negative for congestion.   Eyes:  Negative for discharge.  Respiratory:  Negative for cough and shortness of breath.   Cardiovascular:  Negative for chest pain and palpitations.  Gastrointestinal:  Negative for constipation, nausea and vomiting.  Neurological:  Negative for seizures, loss of consciousness, weakness and headaches.  Psychiatric/Behavioral:  Positive for depression. Negative for hallucinations, substance abuse and suicidal ideas. The patient is nervous/anxious.    Blood pressure (!) 130/98, pulse 100, temperature 98.4 F (36.9 C), temperature source Oral, resp. rate 18, SpO2 100 %. There is no height or weight on file to calculate BMI.  Musculoskeletal: Strength &  Muscle Tone: within normal limits Gait & Station: normal Patient leans: N/A   [place] [facility] Discharge Disposition for Follow up and Recommendations: Based on my evaluation the patient does not appear to have an emergency medical condition and can be discharged with resources and follow up care in outpatient services for Medication Management and Individual Therapy  Recommend discharge home and follow up with outpatient psychiatric resources/services.  Pt provided resources for outpatient psychiatric services and medication management.   Pt does not meet criteria for psychiatric admission or IVC at this time. There is no evidence of imminent risk of harm to self or others Discussed crisis plan, calling 911 or going to the ED if condition worsens.    Please refrain from using alcohol or illicit substances, as they can affect your mood and can cause depression, anxiety or other concerning symptoms.  Alcohol can increase the chance that a person will make reckless decisions, like attempting suicide, and can increase the lethality of a drug overdose.   Condition at discharge is stable.   Randon Goldsmith, NP 06/01/2022, 9:58 PM

## 2022-06-02 ENCOUNTER — Other Ambulatory Visit (HOSPITAL_COMMUNITY): Payer: Self-pay | Admitting: Psychiatry

## 2022-06-02 ENCOUNTER — Encounter (HOSPITAL_COMMUNITY): Payer: Self-pay | Admitting: Student in an Organized Health Care Education/Training Program

## 2022-06-02 ENCOUNTER — Ambulatory Visit (INDEPENDENT_AMBULATORY_CARE_PROVIDER_SITE_OTHER): Payer: No Payment, Other | Admitting: Student in an Organized Health Care Education/Training Program

## 2022-06-02 ENCOUNTER — Other Ambulatory Visit: Payer: Self-pay

## 2022-06-02 VITALS — BP 143/87 | HR 70 | Ht 66.0 in | Wt 101.0 lb

## 2022-06-02 DIAGNOSIS — Z79899 Other long term (current) drug therapy: Secondary | ICD-10-CM

## 2022-06-02 DIAGNOSIS — F321 Major depressive disorder, single episode, moderate: Secondary | ICD-10-CM | POA: Diagnosis not present

## 2022-06-02 DIAGNOSIS — F411 Generalized anxiety disorder: Secondary | ICD-10-CM

## 2022-06-02 DIAGNOSIS — G4700 Insomnia, unspecified: Secondary | ICD-10-CM | POA: Diagnosis not present

## 2022-06-02 MED ORDER — QUETIAPINE FUMARATE 50 MG PO TABS
50.0000 mg | ORAL_TABLET | Freq: Every day | ORAL | 1 refills | Status: DC
  Filled 2022-06-02: qty 30, 30d supply, fill #0
  Filled 2022-06-26: qty 30, 30d supply, fill #1

## 2022-06-02 MED ORDER — SERTRALINE HCL 25 MG PO TABS
25.0000 mg | ORAL_TABLET | Freq: Every day | ORAL | 1 refills | Status: DC
  Filled 2022-06-02: qty 30, 30d supply, fill #0
  Filled 2022-06-26: qty 30, 30d supply, fill #1

## 2022-06-02 NOTE — Progress Notes (Signed)
Psychiatric Initial Adult Assessment   Patient Identification: Maxwell Faulkner MRN:  [id number] Date of Evaluation:  06/02/2022 Referral Source: [facility] Follow Up Chief Complaint:   Chief Complaint  Patient presents with   Establish Care   Depression   Visit Diagnosis:    ICD-10-CM   1. Current moderate episode of major depressive disorder, unspecified whether recurrent (HCC)  F32.1 sertraline (ZOLOFT) 25 MG tablet    QUEtiapine (SEROQUEL) 50 MG tablet    2. GAD (generalized anxiety disorder)  F41.1 sertraline (ZOLOFT) 25 MG tablet    QUEtiapine (SEROQUEL) 50 MG tablet    3. Insomnia, unspecified type  G47.00 sertraline (ZOLOFT) 25 MG tablet    QUEtiapine (SEROQUEL) 50 MG tablet    4. On combination antipsychotic drug therapy  Z79.899 CBC w/Diff/Platelet    Comprehensive Metabolic Panel (CMET)    HgB A1c    Lipid Profile    EKG 12-Lead      History of Present Illness:   Maxwell Faulkner is a 36 yr old male who presents to establish care and for medication management.  PPHx is significant for Depression and Anxiety, no Suicide Attempts, Self Injurious Behavior, or Hospitalizations.  He reports that he has been struggling with symptoms for a while.  He reports that he was incarcerated for a long time and since then has tried to stay out of "trouble."  He reports he always has issues with irritability/anger.  He reports it started with not being able to see his daughter.  He states he has not seen her in over 11 years.  He reports significant issues with sleeping.  He reports significant issues with his appetite and has lost approximately 20 pounds.  He reports he knows he just needs help.  He states recently his stepdad has also taken a turn for the worse.  He does report some paranoia stating that he feels like other people are talking about him.  He reports past psychiatric history significant for depression and anxiety.  He reports no history of suicide attempts.  He reports no history  of self-injurious behavior.  He reports no history of hospitalizations.  He reports no significant past medical history.  He does report a past surgical history significant for 1 wisdom tooth removal.  He reports no history of head trauma or seizures.  He reports NKDA.  He reports he currently stays with different people.  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  He reports having 3 children 2 boys and 1 daughter.  He reports no alcohol use.  He reports smoking 1 pack/day of cigarettes but is interested in quitting.  Discussed 1 800 quit now for support to help quit.  He reports daily THC use.  Encouraged him to reduce or stop use as this can worsen anxiety.  He reports no legal issues.  He reports no access to firearms.  Discussed starting Zoloft for his depression and anxiety.  Discussed starting Seroquel to help augment his Zoloft and help with his sleep.  Discussed potential side effects and risks and he was agreeable to the medication trials.  Discussed that the Seroquel he will need the following lab work-CBC, CMP, lipid panel, and A1c.  As he currently does not have a PCP provided him with information to establish with [facility] and discussed the need to obtain an EKG with them once established.  He reported understanding and had no questions.  He reports no  SI, HI, or AVH.  Discussed with him what to do in the event of a future crisis.  Discussed that he can return to [facility], go to [facility], go to the nearest ED, or call 911 or [phone].   He reported understanding and had no concerns.    Associated Signs/Symptoms: Depression Symptoms:  depressed mood, anhedonia, fatigue, feelings of worthlessness/guilt, difficulty concentrating, hopelessness, anxiety, loss of energy/fatigue, disturbed sleep, weight loss, decreased appetite, (Hypo) Manic Symptoms:   Reports None Anxiety Symptoms:  Excessive Worry, Psychotic Symptoms:  Paranoia, PTSD  Symptoms: NA  Past Psychiatric History: Depression and Anxiety, no Suicide Attempts, Self Injurious Behavior, or Hospitalizations.  Previous Psychotropic Medications: Yes   Substance Abuse History in the last 12 months:  Yes.    Consequences of Substance Abuse: Medical Consequences:  Increased Anxiety  Past Medical History:  Past Medical History:  Diagnosis Date   Anxiety    Depression    Hypertension     Past Surgical History:  Procedure Laterality Date   WISDOM TOOTH EXTRACTION      Family Psychiatric History: No Known Diagnosis', Substance Abuse, or Suicides.  Family History:  Family History  Problem Relation Age of Onset   Hypertension Mother     Social History:   Social History   Socioeconomic History   Marital status: Significant Other    Spouse name: Not on file   Number of children: Not on file   Years of education: Not on file   Highest education level: Not on file  Occupational History   Not on file  Tobacco Use   Smoking status: Every Day    Packs/day: 1.00    Types: Cigarettes   Smokeless tobacco: Never  Vaping Use   Vaping Use: Never used  Substance and Sexual Activity   Alcohol use: No    Comment: stopped drinking   Drug use: Yes    Types: Marijuana   Sexual activity: Not on file  Other Topics Concern   Not on file  Social History Narrative   Not on file   Social Determinants of Health   Financial Resource Strain: Not on file  Food Insecurity: Not on file  Transportation Needs: Not on file  Physical Activity: Not on file  Stress: Not on file  Social Connections: Not on file    Additional Social History: None  Allergies:  No Known Allergies  Metabolic Disorder Labs: No results found for: "HGBA1C", "MPG" No results found for: "PROLACTIN" No results found for: "CHOL", "TRIG", "HDL", "CHOLHDL", "VLDL", "LDLCALC" No results found for: "TSH"  Therapeutic Level Labs: No results found for: "LITHIUM" No results found for:  "CBMZ" No results found for: "VALPROATE"  Current Medications: Current Outpatient Medications  Medication Sig Dispense Refill   QUEtiapine (SEROQUEL) 50 MG tablet Take 1 tablet (50 mg total) by mouth at bedtime. 30 tablet 1   sertraline (ZOLOFT) 25 MG tablet Take 1 tablet (25 mg total) by mouth daily. 30 tablet 1   No current facility-administered medications for this visit.    Musculoskeletal: Strength & Muscle Tone: decreased Gait & Station: normal Patient leans: N/A  Psychiatric Specialty Exam: Review of Systems  Respiratory:  Negative for shortness of breath.   Cardiovascular:  Negative for chest pain.  Gastrointestinal:  Negative for abdominal pain, constipation, diarrhea, nausea and vomiting.  Neurological:  Positive for dizziness (mild). Negative for weakness and headaches.  Psychiatric/Behavioral:  Positive for dysphoric mood and sleep disturbance. Negative for hallucinations, self-injury and suicidal ideas. The patient  is nervous/anxious.     Blood pressure (!) 143/87, pulse 70, height 5\' 6"  (1.676 m), weight 101 lb (45.8 kg), SpO2 100 %.Body mass index is 16.3 kg/m.  General Appearance: Casual and Fairly Groomed  Eye Contact:  Fair  Speech:  Clear and Coherent and Normal Rate  Volume:  Normal  Mood:  Depressed  Affect:  Congruent, Depressed, and Tearful  Thought Process:  Coherent  Orientation:  Full (Time, Place, and Person)  Thought Content:  Logical with some paranoia of people talking about him  Suicidal Thoughts:  No  Homicidal Thoughts:  No  Memory:  Immediate;   Good Recent;   Good  Judgement:  Fair  Insight:  Fair  Psychomotor Activity:  Normal  Concentration:  Concentration: Good and Attention Span: Good  Recall:  Good  Fund of Knowledge:Good  Language: Good  Akathisia:  Negative  Handed:  Right  AIMS (if indicated):  not done  Assets:  Communication Skills Desire for Improvement Resilience Social Support  ADL's:  Intact  Cognition: WNL   Sleep:  Poor   Screenings: GAD-7    Flowsheet Row Office Visit from 06/02/2022 in [facility]  Total GAD-7 Score 18      PHQ2-9    Flowsheet Row Office Visit from 06/02/2022 in [place]  PHQ-2 Total Score 6  PHQ-9 Total Score 23      Flowsheet Row ED from 02/15/2022 in [facility] ED from 09/23/2021 in [facility] from 06/09/2021 in [facility] at [facility] RISK CATEGORY No Risk No Risk Error: Question 6 not populated       Assessment and Plan:  Aleem L. Sarin is a 36 yr old male who presents to establish care and for medication management.  PPHx is significant for Depression and Anxiety, no Suicide Attempts, Self Injurious Behavior, or Hospitalizations.  [place] meets criteria for MDD and GAD.  To address this we will start Zoloft.  To address his sleep and appetite we will start Seroquel also with his reports of paranoia he would benefit from having an antipsychotic as well.  He will have a CMP, CBC, Lipid Panel, and A1c today.  He will go to [facility] to establish care with a PCP and will obtain an EKG.  He will return in approximately 4-5 weeks.   MDD, [place], Moderate  GAD: -Start Zoloft 25 mg daily.  30 tablets with 1 refill. -Start Seroquel 50 mg QHS for Augmentation, sleep, appetite, and psychosis coverage. 30 tablets with 1 refill   Insomnia: -Start Seroquel 50 mg QHS for Augmentation, sleep, appetite, and psychosis coverage. 30 tablets with 1 refill     Collaboration of Care:   Patient/Guardian was advised Release of Information must be obtained prior to any record release in order to collaborate their care with an outside provider. Patient/Guardian was advised if they have not already done so to contact the registration department to sign all necessary forms in order for [facility] to release information regarding  their care.   Consent: Patient/Guardian gives verbal consent for treatment and assignment of benefits for services provided during this visit. Patient/Guardian expressed understanding and agreed to proceed.   [place], MD 8/4/20232:00 PM

## 2022-06-05 LAB — SPECIMEN STATUS

## 2022-06-06 LAB — LIPID PANEL
Chol/HDL Ratio: 2.9 ratio (ref 0.0–5.0)
Cholesterol, Total: 126 mg/dL (ref 100–199)
HDL: 44 mg/dL (ref >39)
LDL Chol Calc (NIH): 69 mg/dL (ref 0–99)
Triglycerides: 60 mg/dL (ref 0–149)
VLDL Cholesterol Cal: 13 mg/dL (ref 5–40)

## 2022-06-06 LAB — SPECIMEN STATUS REPORT

## 2022-06-12 ENCOUNTER — Telehealth (HOSPITAL_COMMUNITY): Payer: Self-pay | Admitting: General Practice

## 2022-06-12 NOTE — BH Assessment (Signed)
Care Management - [facility] Follow Up Discharges   Writer attempted to make contact with patient today and was unsuccessful.  Writer left a HIPPA compliant voice message.   Phone just rang.  Per chart review, patient completed a follow up appointment wth Dr. Renaldo Fiddler on 06-02-22 at [facility]

## 2022-06-26 ENCOUNTER — Other Ambulatory Visit: Payer: Self-pay

## 2022-07-18 ENCOUNTER — Ambulatory Visit (INDEPENDENT_AMBULATORY_CARE_PROVIDER_SITE_OTHER): Payer: No Payment, Other | Admitting: Student in an Organized Health Care Education/Training Program

## 2022-07-18 ENCOUNTER — Other Ambulatory Visit: Payer: Self-pay

## 2022-07-18 ENCOUNTER — Encounter (HOSPITAL_COMMUNITY): Payer: Self-pay | Admitting: Student in an Organized Health Care Education/Training Program

## 2022-07-18 DIAGNOSIS — F411 Generalized anxiety disorder: Secondary | ICD-10-CM

## 2022-07-18 DIAGNOSIS — F321 Major depressive disorder, single episode, moderate: Secondary | ICD-10-CM

## 2022-07-18 DIAGNOSIS — G4700 Insomnia, unspecified: Secondary | ICD-10-CM | POA: Diagnosis not present

## 2022-07-18 MED ORDER — SERTRALINE HCL 25 MG PO TABS
25.0000 mg | ORAL_TABLET | Freq: Every day | ORAL | 1 refills | Status: DC
  Filled 2022-07-18: qty 30, 30d supply, fill #0
  Filled 2022-08-28: qty 30, 30d supply, fill #1

## 2022-07-18 MED ORDER — SERTRALINE HCL 25 MG PO TABS: 25.0000 mg | ORAL_TABLET | Freq: Every day | ORAL | 1 refills | Status: DC

## 2022-07-18 MED ORDER — QUETIAPINE FUMARATE 50 MG PO TABS
50.0000 mg | ORAL_TABLET | Freq: Every day | ORAL | 1 refills | Status: DC
  Filled 2022-07-18: qty 30, 30d supply, fill #0
  Filled 2022-08-28: qty 30, 30d supply, fill #1

## 2022-07-18 MED ORDER — QUETIAPINE FUMARATE 50 MG PO TABS: 50.0000 mg | ORAL_TABLET | Freq: Every day | ORAL | 1 refills | Status: DC

## 2022-07-18 NOTE — Progress Notes (Signed)
[facility] MD/PA/NP OP Progress Note  07/18/2022 8:25 AM Maxwell Faulkner  MRN:  [id number]  Chief Complaint:  Chief Complaint  Patient presents with   Follow-up   Depression   HPI:  Maxwell Faulkner is a 36 yr old Faulkner who presents for follow up and for medication management.  PPHx is significant for Depression and Anxiety, no Suicide Attempts, Self Injurious Behavior, or Hospitalizations.  He has been doing well since being started on his medications.  He reports that he is no longer having crying spells or worrying about things like he used to.  He reports that when something comes up he has the ability to push it from his mind and continue doing what needs to be done.  He reports that he has now got a job and is currently working as a Training and development officer at [employer].  He reports his 67-year-old son is now wanting to stay with him more.  He reports his girlfriend and other family members are reporting he is improved since starting the medication.  He reports no issues with his medications.  He reports no SI, HI, or AVH.  He reports his sleep is good.  He reports his appetite is good.  He reports no other concerns at present.  He will return for follow up in approximately 6 weeks.    Visit Diagnosis:    ICD-10-CM   1. Current moderate episode of major depressive disorder, unspecified whether recurrent (HCC)  F32.1 QUEtiapine (SEROQUEL) 50 MG tablet    sertraline (ZOLOFT) 25 MG tablet    DISCONTINUED: QUEtiapine (SEROQUEL) 50 MG tablet    DISCONTINUED: sertraline (ZOLOFT) 25 MG tablet    2. GAD (generalized anxiety disorder)  F41.1 QUEtiapine (SEROQUEL) 50 MG tablet    sertraline (ZOLOFT) 25 MG tablet    DISCONTINUED: QUEtiapine (SEROQUEL) 50 MG tablet    DISCONTINUED: sertraline (ZOLOFT) 25 MG tablet    3. Insomnia, unspecified type  G47.00 QUEtiapine (SEROQUEL) 50 MG tablet    sertraline (ZOLOFT) 25 MG tablet    DISCONTINUED: QUEtiapine (SEROQUEL) 50 MG tablet    DISCONTINUED: sertraline (ZOLOFT) 25 MG tablet       Past Psychiatric History: Depression and Anxiety, no Suicide Attempts, Self Injurious Behavior, or Hospitalizations.  Past Medical History:  Past Medical History:  Diagnosis Date   Anxiety    Depression    Hypertension     Past Surgical History:  Procedure Laterality Date   WISDOM TOOTH EXTRACTION      Family Psychiatric History:  No Known Diagnosis', Substance Abuse, or Suicides.  Family History:  Family History  Problem Relation Age of Onset   Hypertension Mother     Social History:  Social History   Socioeconomic History   Marital status: Significant Other    Spouse name: Not on file   Number of children: Not on file   Years of education: Not on file   Highest education level: Not on file  Occupational History   Not on file  Tobacco Use   Smoking status: Every Day    Packs/day: 1.00    Types: Cigarettes   Smokeless tobacco: Never  Vaping Use   Vaping Use: Never used  Substance and Sexual Activity   Alcohol use: No    Comment: stopped drinking   Drug use: Yes    Types: Marijuana   Sexual activity: Not on file  Other Topics Concern   Not on file  Social History Narrative   Not on file   Social  Determinants of Health   Financial Resource Strain: Not on file  Food Insecurity: Not on file  Transportation Needs: Not on file  Physical Activity: Not on file  Stress: Not on file  Social Connections: Not on file    Allergies: No Known Allergies  Metabolic Disorder Labs: No results found for: "HGBA1C", "MPG" No results found for: "PROLACTIN" Lab Results  Component Value Date   CHOL 126 06/02/2022   TRIG 60 06/02/2022   HDL 44 06/02/2022   CHOLHDL 2.9 06/02/2022   LDLCALC 69 06/02/2022   No results found for: "TSH"  Therapeutic Level Labs: No results found for: "LITHIUM" No results found for: "VALPROATE" No results found for: "CBMZ"  Current Medications: Current Outpatient Medications  Medication Sig Dispense Refill   QUEtiapine  (SEROQUEL) 50 MG tablet Take 1 tablet (50 mg total) by mouth at bedtime. 30 tablet 1   sertraline (ZOLOFT) 25 MG tablet Take 1 tablet (25 mg total) by mouth daily. 30 tablet 1   No current facility-administered medications for this visit.     Musculoskeletal: Strength & Muscle Tone: within normal limits Gait & Station: normal Patient leans: N/A  Psychiatric Specialty Exam: Review of Systems  Respiratory:  Negative for cough and shortness of breath.   Cardiovascular:  Negative for chest pain.  Gastrointestinal:  Negative for abdominal pain, constipation, diarrhea, nausea and vomiting.  Neurological:  Negative for weakness and headaches.  Psychiatric/Behavioral:  Negative for agitation, dysphoric mood, hallucinations, self-injury, sleep disturbance and suicidal ideas. The patient is not nervous/anxious.     Blood pressure 120/85, pulse 73, height 5\' 6"  (1.676 m), weight 105 lb (47.6 kg), SpO2 100 %.Body mass index is 16.95 kg/m.  General Appearance: Casual and Fairly Groomed  Eye Contact:  Good  Speech:  Clear and Coherent and Normal Rate  Volume:  Normal  Mood:  Euthymic  Affect:  Appropriate and Congruent  Thought Process:  Coherent and Goal Directed  Orientation:  Full (Time, Place, and Person)  Thought Content: WDL and Logical   Suicidal Thoughts:  No  Homicidal Thoughts:  No  Memory:  Immediate;   Good Recent;   Good  Judgement:  Good  Insight:  Good  Psychomotor Activity:  Normal  Concentration:  Concentration: Good and Attention Span: Good  Recall:  Good  Fund of Knowledge: Good  Language: Good  Akathisia:  Negative  Handed:  Right  AIMS (if indicated): done AIMS=0  Assets:  Communication Skills Desire for Improvement Physical Health Resilience Social Support Vocational/Educational  ADL's:  Intact  Cognition: WNL  Sleep:  Good   Screenings: GAD-7    Flowsheet Row Office Visit from 06/02/2022 in [facility]  Total GAD-7 Score  18      PHQ2-9    Flowsheet Row Office Visit from 06/02/2022 in [facility]  PHQ-2 Total Score 6  PHQ-9 Total Score 23      Flowsheet Row ED from 02/15/2022 in [facility] ED from 09/23/2021 in [facility] from 06/09/2021 in [facility] RISK CATEGORY No Risk No Risk Error: Question 6 not populated        Assessment and Plan:  Maxwell Faulkner is a 36 yr old Faulkner who presents for follow up and for medication management.  PPHx is significant for Depression and Anxiety, no Suicide Attempts, Self Injurious Behavior, or Hospitalizations.    Maxwell Faulkner has responded well to  his medications without issues.  He is functioning much better and able to do what he needs to in his day to day activities.  We will not make any medication changes at this time.  He will return for follow up in approximately 6 weeks.       MDD, Recurrant, Moderate  GAD: -Continue Zoloft 25 mg daily.  30 tablets with 1 refill. -Continue Seroquel 50 mg QHS for Augmentation, sleep, appetite, and psychosis coverage. 30 tablets with 1 refill     Insomnia: -Continue Seroquel 50 mg QHS for Augmentation, sleep, appetite, and psychosis coverage. 30 tablets with 1 refill   Collaboration of Care:   Patient/Guardian was advised Release of Information must be obtained prior to any record release in order to collaborate their care with an outside provider. Patient/Guardian was advised if they have not already done so to contact the registration department to sign all necessary forms in order for [place] to release information regarding their care.   Consent: Patient/Guardian gives verbal consent for treatment and assignment of benefits for services provided during this visit. Patient/Guardian expressed understanding and agreed to proceed.    Lauro Franklin, MD 07/18/2022, 8:25 AM

## 2022-08-28 ENCOUNTER — Other Ambulatory Visit: Payer: Self-pay

## 2022-08-29 ENCOUNTER — Encounter (HOSPITAL_COMMUNITY): Payer: Self-pay | Admitting: Student in an Organized Health Care Education/Training Program

## 2022-08-29 ENCOUNTER — Other Ambulatory Visit: Payer: Self-pay

## 2022-08-29 ENCOUNTER — Ambulatory Visit (INDEPENDENT_AMBULATORY_CARE_PROVIDER_SITE_OTHER): Payer: No Payment, Other | Admitting: Student in an Organized Health Care Education/Training Program

## 2022-08-29 DIAGNOSIS — F321 Major depressive disorder, single episode, moderate: Secondary | ICD-10-CM

## 2022-08-29 DIAGNOSIS — F411 Generalized anxiety disorder: Secondary | ICD-10-CM | POA: Diagnosis not present

## 2022-08-29 DIAGNOSIS — G4700 Insomnia, unspecified: Secondary | ICD-10-CM

## 2022-08-29 MED ORDER — SERTRALINE HCL 25 MG PO TABS: 25.0000 mg | ORAL_TABLET | Freq: Every day | ORAL | 1 refills | Status: DC

## 2022-08-29 MED ORDER — QUETIAPINE FUMARATE 50 MG PO TABS
50.0000 mg | ORAL_TABLET | Freq: Every day | ORAL | 1 refills | Status: DC
  Filled 2022-08-29: qty 30, 30d supply, fill #0
  Filled 2022-10-02: qty 30, 30d supply, fill #1

## 2022-08-29 NOTE — Progress Notes (Signed)
[facility] MD/PA/NP OP Progress Note  08/29/2022 8:22 AM Maxwell Faulkner  MRN:  [id number]  Chief Complaint:  Chief Complaint  Patient presents with   Follow-up   Medication Refill   HPI:  Maxwell Faulkner is a 36 yr old male who presents for follow up and for medication management.  PPHx is significant for Depression and Anxiety, no Suicide Attempts, Self Injurious Behavior, or Hospitalizations.   He reports that he is continuing to do well with his medications.  He reports no side effects from his medications.  He reports things have been going good.  He reports that he no longer is wanting to argue with his ex.  He reports his his mother has told him that she is seeing a complete 360 change in him.  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  He reports that his current goal is to save up money to get a lawyer so that he can see his daughter again and work towards getting his own housing so he no longer has to live with his mother.  Discussed the importance of him getting a PCP and reminded him that where he goes to pick up his medications is also a PCP clinic and he states he will go today to try to get an appointment set up.  Discussed the importance of routine medical screening as well as lab work and EKG which is needed.  He reports no SI, HI, or AVH.  He reports sleep is doing good.  He reports appetite is doing good.  He reports no other concerns at present.  He will return for follow-up in approximately 2 months.   Visit Diagnosis:    ICD-10-CM   1. Current moderate episode of major depressive disorder, unspecified whether recurrent (HCC)  F32.1 QUEtiapine (SEROQUEL) 50 MG tablet    sertraline (ZOLOFT) 25 MG tablet    2. GAD (generalized anxiety disorder)  F41.1 QUEtiapine (SEROQUEL) 50 MG tablet    sertraline (ZOLOFT) 25 MG tablet    3. Insomnia, unspecified type  G47.00 QUEtiapine (SEROQUEL) 50 MG tablet    sertraline (ZOLOFT) 25 MG  tablet      Past Psychiatric History: Depression and Anxiety, no Suicide Attempts, Self Injurious Behavior, or Hospitalizations.  Past Medical History:  Past Medical History:  Diagnosis Date   Anxiety    Depression    Hypertension     Past Surgical History:  Procedure Laterality Date   WISDOM TOOTH EXTRACTION      Family Psychiatric History: No Known Diagnosis', Substance Abuse, or Suicides.  Family History:  Family History  Problem Relation Age of Onset   Hypertension Mother     Social History:  Social History   Socioeconomic History   Marital status: Significant Other    Spouse name: Not on file   Number of children: Not on file   Years of education: Not on file   Highest education level: Not on file  Occupational History   Not on file  Tobacco Use   Smoking status: Every Day    Packs/day: 1.00    Types: Cigarettes   Smokeless tobacco: Never  Vaping Use   Vaping Use: Never used  Substance and Sexual Activity   Alcohol use: No    Comment: stopped drinking   Drug use: Yes    Types: Marijuana   Sexual activity: Not on file  Other Topics Concern   Not on file  Social History Narrative   Not on file   Social Determinants of Health   Financial Resource Strain: Not on file  Food Insecurity: Not on file  Transportation Needs: Not on file  Physical Activity: Not on file  Stress: Not on file  Social Connections: Not on file    Allergies: No Known Allergies  Metabolic Disorder Labs: No results found for: "HGBA1C", "MPG" No results found for: "PROLACTIN" Lab Results  Component Value Date   CHOL 126 06/02/2022   TRIG 60 06/02/2022   HDL 44 06/02/2022   CHOLHDL 2.9 06/02/2022   [place] 69 06/02/2022   No results found for: "TSH"  Therapeutic Level Labs: No results found for: "LITHIUM" No results found for: "VALPROATE" No results found for: "CBMZ"  Current Medications: Current Outpatient Medications  Medication Sig Dispense Refill    QUEtiapine (SEROQUEL) 50 MG tablet Take 1 tablet (50 mg total) by mouth at bedtime. 30 tablet 1   sertraline (ZOLOFT) 25 MG tablet Take 1 tablet (25 mg total) by mouth daily. 30 tablet 1   No current facility-administered medications for this visit.     Musculoskeletal: Strength & Muscle Tone: within normal limits Gait & Station: normal Patient leans: N/A  Psychiatric Specialty Exam: Review of Systems  Respiratory:  Negative for cough and shortness of breath.   Cardiovascular:  Negative for chest pain.  Gastrointestinal:  Negative for abdominal pain, constipation, diarrhea, nausea and vomiting.  Neurological:  Negative for weakness and headaches.  Psychiatric/Behavioral:  Negative for agitation, dysphoric mood, hallucinations, sleep disturbance and suicidal ideas. The patient is not nervous/anxious.     Blood pressure 128/79, pulse 83, height 5\' 6"  (1.676 m), weight 111 lb 3.2 oz (50.4 kg), SpO2 100 %.Body mass index is 17.95 kg/m.  General Appearance: Casual and Fairly Groomed  Eye Contact:  Good  Speech:  Clear and Coherent and Normal Rate  Volume:  Normal  Mood:  Euthymic  Affect:  Appropriate and Congruent  Thought Process:  Coherent and Goal Directed  Orientation:  Full (Time, Place, and Person)  Thought Content: WDL and Logical   Suicidal Thoughts:  No  Homicidal Thoughts:  No  Memory:  Immediate;   Good Recent;   Good  Judgement:  Good  Insight:  Good  Psychomotor Activity:  Normal  Concentration:  Concentration: Good and Attention Span: Good  Recall:  Good  Fund of Knowledge: Good  Language: Good  Akathisia:  Negative  Handed:  Right  AIMS (if indicated): done AIMS=0  Assets:  Communication Skills Desire for Improvement Housing Physical Health Resilience Social Support  ADL's:  Intact  Cognition: WNL  Sleep:  Good   Screenings: GAD-7    Flowsheet Row Office Visit from 06/02/2022 in [facility]  Total GAD-7 Score 18       PHQ2-9    [place] Office Visit from 06/02/2022 in [facility]  PHQ-2 Total Score 6  PHQ-9 Total Score 23      [place] ED from 02/15/2022 in [facility] ED from 09/23/2021 in [place] DEPT ED from 06/09/2021 in [facility] [place] No Risk No Risk Error: Question 6 not populated        Assessment and Plan:  Maxwell Faulkner is a 36 yr old male who presents for follow up and for medication management.  PPHx is significant for Depression and Anxiety, no Suicide Attempts, Self  Injurious Behavior, or Hospitalizations.   Maxwell Faulkner is continuing to do well with his medications without side effects.  We will not make any changes to his medications at this time.  We will continue to monitor.  He will go to [facility] to establish with a PCP.  He will return for follow-up in approximately 2 months.   MDD, Recurrant, Moderate  GAD: -Continue Zoloft 25 mg daily.  30 tablets with 1 refill. -Continue Seroquel 50 mg QHS for Augmentation, sleep, and appetite.  30 tablets with 1 refill     Insomnia: -Continue Seroquel 50 mg QHS for Augmentation, sleep, and appetite.  30 tablets with 1 refill    Collaboration of Care:    Patient/Guardian was advised Release of Information must be obtained prior to any record release in order to collaborate their care with an outside provider. Patient/Guardian was advised if they have not already done so to contact the registration department to sign all necessary forms in order for [place] to release information regarding their care.   Consent: Patient/Guardian gives verbal consent for treatment and assignment of benefits for services provided during this visit. Patient/Guardian expressed understanding and agreed to proceed.    Lauro Franklin, MD 08/29/2022, 8:22 AM

## 2022-10-02 ENCOUNTER — Other Ambulatory Visit: Payer: Self-pay

## 2022-10-03 ENCOUNTER — Other Ambulatory Visit: Payer: Self-pay

## 2022-10-03 ENCOUNTER — Other Ambulatory Visit (HOSPITAL_COMMUNITY): Payer: Self-pay

## 2022-10-20 ENCOUNTER — Ambulatory Visit (INDEPENDENT_AMBULATORY_CARE_PROVIDER_SITE_OTHER): Payer: Medicaid Other | Admitting: Student in an Organized Health Care Education/Training Program

## 2022-10-20 VITALS — BP 132/81 | HR 78 | Ht 66.0 in | Wt 103.4 lb

## 2022-10-20 DIAGNOSIS — G4700 Insomnia, unspecified: Secondary | ICD-10-CM

## 2022-10-20 DIAGNOSIS — F411 Generalized anxiety disorder: Secondary | ICD-10-CM | POA: Diagnosis not present

## 2022-10-20 DIAGNOSIS — F321 Major depressive disorder, single episode, moderate: Secondary | ICD-10-CM

## 2022-10-20 MED ORDER — SERTRALINE HCL 25 MG PO TABS: 25.0000 mg | ORAL_TABLET | Freq: Every day | ORAL | 0 refills | Status: DC

## 2022-10-20 MED ORDER — QUETIAPINE FUMARATE 50 MG PO TABS: 50.0000 mg | ORAL_TABLET | Freq: Every day | ORAL | 0 refills | Status: DC

## 2022-10-20 NOTE — Progress Notes (Signed)
[facility] MD/PA/NP OP Progress Note  10/20/2022 8:56 AM Maxwell Faulkner  MRN:  [id number]  Chief Complaint:  Chief Complaint  Patient presents with   Follow-up   Medication Refill   HPI:  Maxwell Faulkner is a 36 yr old male who presents for follow up and for medication management.  PPHx is significant for Depression and Anxiety, no Suicide Attempts, Self Injurious Behavior, or Hospitalizations.   He reports that he is continuing to do well on his medications.  He reports that he knows things are much better for him after starting his medications.  He reports that he used to get into arguments with his exes all the time but now he does not want to do that he wants to ensure he stays out of trouble so he can keep doing what he wants to do.  He reports that he is still living with his mother but his next goal is to get his own place.  He reports no side effects from his medications.  He reports no SI, HI, or AVH.  He reports sleep is good.  He reports his appetite is doing good.  He reports no other concerns at present.  He will return for follow-up in approximately 2 months.   Visit Diagnosis:    ICD-10-CM   1. Current moderate episode of major depressive disorder, unspecified whether recurrent (HCC)  F32.1 QUEtiapine (SEROQUEL) 50 MG tablet    sertraline (ZOLOFT) 25 MG tablet    2. GAD (generalized anxiety disorder)  F41.1 QUEtiapine (SEROQUEL) 50 MG tablet    sertraline (ZOLOFT) 25 MG tablet    3. Insomnia, unspecified type  G47.00 QUEtiapine (SEROQUEL) 50 MG tablet    sertraline (ZOLOFT) 25 MG tablet      Past Psychiatric History: Depression and Anxiety, no Suicide Attempts, Self Injurious Behavior, or Hospitalizations.   Past Medical History:  Past Medical History:  Diagnosis Date   Anxiety    Depression    Hypertension     Past Surgical History:  Procedure Laterality Date   WISDOM TOOTH EXTRACTION      Family Psychiatric History: No Known Diagnosis', Substance Abuse, or  Suicides.   Family History:  Family History  Problem Relation Age of Onset   Hypertension Mother     Social History:  Social History   Socioeconomic History   Marital status: Significant Other    Spouse name: Not on file   Number of children: Not on file   Years of education: Not on file   Highest education level: Not on file  Occupational History   Not on file  Tobacco Use   Smoking status: Every Day    Packs/day: 1.00    Types: Cigarettes   Smokeless tobacco: Never  Vaping Use   Vaping Use: Never used  Substance and Sexual Activity   Alcohol use: No    Comment: stopped drinking   Drug use: Yes    Types: Marijuana   Sexual activity: Not on file  Other Topics Concern   Not on file  Social History Narrative   Not on file   Social Determinants of Health   Financial Resource Strain: Not on file  Food Insecurity: Not on file  Transportation Needs: Not on file  Physical Activity: Not on file  Stress: Not on file  Social Connections: Not on file    Allergies: No Known Allergies  Metabolic Disorder Labs: No results found for: "HGBA1C", "MPG" No results found for: "PROLACTIN" Lab Results  Component  Value Date   CHOL 126 06/02/2022   TRIG 60 06/02/2022   HDL 44 06/02/2022   CHOLHDL 2.9 06/02/2022   LDLCALC 69 06/02/2022   No results found for: "TSH"  Therapeutic Level Labs: No results found for: "LITHIUM" No results found for: "VALPROATE" No results found for: "CBMZ"  Current Medications: Current Outpatient Medications  Medication Sig Dispense Refill   QUEtiapine (SEROQUEL) 50 MG tablet Take 1 tablet (50 mg total) by mouth at bedtime. 90 tablet 0   sertraline (ZOLOFT) 25 MG tablet Take 1 tablet (25 mg total) by mouth daily. 90 tablet 0   No current facility-administered medications for this visit.     Musculoskeletal: Strength & Muscle Tone: within normal limits Gait & Station: normal Patient leans: N/A  Psychiatric Specialty Exam: Review of  Systems  Respiratory:  Negative for cough and shortness of breath.   Cardiovascular:  Negative for chest pain.  Gastrointestinal:  Negative for abdominal pain, constipation, diarrhea, nausea and vomiting.  Neurological:  Negative for weakness and headaches.  Psychiatric/Behavioral:  Negative for dysphoric mood, hallucinations, sleep disturbance and suicidal ideas. The patient is not nervous/anxious.     Blood pressure 132/81, pulse 78, height 5\' 6"  (1.676 m), weight 103 lb 6.4 oz (46.9 kg), SpO2 100 %.Body mass index is 16.69 kg/m.  General Appearance: Casual and Fairly Groomed  Eye Contact:  Good  Speech:  Clear and Coherent and Normal Rate  Volume:  Normal  Mood:  Euthymic  Affect:  Appropriate and Congruent  Thought Process:  Coherent and Goal Directed  Orientation:  Full (Time, Place, and Person)  Thought Content: WDL and Logical   Suicidal Thoughts:  No  Homicidal Thoughts:  No  Memory:  Immediate;   Good Recent;   Good  Judgement:  Good  Insight:  Good  Psychomotor Activity:  Normal  Concentration:  Concentration: Good and Attention Span: Good  Recall:  Good  Fund of Knowledge: Good  Language: Good  Akathisia:  Negative  Handed:  Right  AIMS (if indicated): not done  Assets:  Communication Skills Desire for Improvement Housing Physical Health Resilience Social Support  ADL's:  Intact  Cognition: WNL  Sleep:  Good   Screenings: GAD-7    Flowsheet Row Office Visit from 06/02/2022 in [facility]  Total GAD-7 Score 18      PHQ2-9    Flowsheet Row Office Visit from 06/02/2022 in [facility]  PHQ-2 Total Score 6  PHQ-9 Total Score 23      Flowsheet Row ED from 02/15/2022 in [facility] ED from 09/23/2021 in [facility] ED from 06/09/2021 in [facility] at [facility] RISK CATEGORY No Risk No Risk Error: Question 6 not populated         Assessment and Plan:   Maxwell Faulkner is a 36 yr old male who presents for follow up and for medication management.  PPHx is significant for Depression and Anxiety, no Suicide Attempts, Self Injurious Behavior, or Hospitalizations.    Sue still doing well on his medications without any side effects.  We will not make any changes to his medications at this time.  We will send in refills.  He will return for follow-up in approximately 2 months.   MDD, Recurrant, Moderate  GAD: -Continue Zoloft 25 mg daily.  30 tablets with 1 refill. -Continue Seroquel 50 mg QHS for Augmentation, sleep, and appetite.  90  tablets with 0 refills.     Insomnia: -Continue Seroquel 50 mg QHS for Augmentation, sleep, and appetite.  90 tablets with 0 refills.     Collaboration of Care:   Patient/Guardian was advised Release of Information must be obtained prior to any record release in order to collaborate their care with an outside provider. Patient/Guardian was advised if they have not already done so to contact the registration department to sign all necessary forms in order for [place] to release information regarding their care.   Consent: Patient/Guardian gives verbal consent for treatment and assignment of benefits for services provided during this visit. Patient/Guardian expressed understanding and agreed to proceed.    Lauro Franklin, MD 10/20/2022, 8:56 AM

## 2022-11-01 ENCOUNTER — Telehealth (HOSPITAL_COMMUNITY): Payer: Self-pay | Admitting: *Deleted

## 2022-11-01 NOTE — Telephone Encounter (Signed)
Fax received from [facility] for approval of Quetiapine 50mg  from 11/01/22-11/01/23. Approval code [id number].

## 2022-11-02 ENCOUNTER — Other Ambulatory Visit: Payer: Self-pay

## 2022-11-02 ENCOUNTER — Telehealth (HOSPITAL_COMMUNITY): Payer: Self-pay | Admitting: *Deleted

## 2022-11-02 ENCOUNTER — Other Ambulatory Visit (HOSPITAL_COMMUNITY): Payer: Self-pay | Admitting: Student in an Organized Health Care Education/Training Program

## 2022-11-02 DIAGNOSIS — F411 Generalized anxiety disorder: Secondary | ICD-10-CM

## 2022-11-02 DIAGNOSIS — F321 Major depressive disorder, single episode, moderate: Secondary | ICD-10-CM

## 2022-11-02 DIAGNOSIS — G4700 Insomnia, unspecified: Secondary | ICD-10-CM

## 2022-11-02 NOTE — Telephone Encounter (Signed)
Notified [facility] patients PA for his Quetiapine was approved. Pharmacy able to run it thru.

## 2022-11-02 NOTE — Telephone Encounter (Signed)
Received request for 30 day refill of patient's Seroquel, this was declined because a refill for 90 days was sent on 12/22 when patient was last seen.   Fatima Sanger MD Resident

## 2022-11-06 ENCOUNTER — Other Ambulatory Visit: Payer: Self-pay

## 2022-11-21 ENCOUNTER — Other Ambulatory Visit: Payer: Self-pay

## 2022-12-22 ENCOUNTER — Ambulatory Visit (INDEPENDENT_AMBULATORY_CARE_PROVIDER_SITE_OTHER): Payer: Medicaid Other | Admitting: Student in an Organized Health Care Education/Training Program

## 2022-12-22 ENCOUNTER — Encounter (HOSPITAL_COMMUNITY): Payer: Self-pay | Admitting: Student in an Organized Health Care Education/Training Program

## 2022-12-22 DIAGNOSIS — F321 Major depressive disorder, single episode, moderate: Secondary | ICD-10-CM

## 2022-12-22 DIAGNOSIS — F411 Generalized anxiety disorder: Secondary | ICD-10-CM

## 2022-12-22 DIAGNOSIS — G4700 Insomnia, unspecified: Secondary | ICD-10-CM

## 2022-12-22 MED ORDER — QUETIAPINE FUMARATE 50 MG PO TABS: 50.0000 mg | ORAL_TABLET | Freq: Every day | ORAL | 0 refills | Status: DC

## 2022-12-22 MED ORDER — SERTRALINE HCL 25 MG PO TABS: 25.0000 mg | ORAL_TABLET | Freq: Every day | ORAL | 0 refills | Status: DC

## 2022-12-22 NOTE — Progress Notes (Signed)
[place] MD/PA/NP OP Progress Note  12/22/2022 8:11 AM NAVEN SHAEFER  MRN:  [id number]  Chief Complaint:  Chief Complaint  Patient presents with   Follow-up   Medication Refill   HPI:  Tamara Demarest. Gislason is a 37 yr old male who presents for Follow Up and Medication Management.  PPHx is significant for Depression and Anxiety, and no Suicide Attempts, Self Injurious Behavior, or Hospitalizations.   He reports that he is continuing to do great on his medications.  He reports his family keeps telling him that there has been such a positive change.  He reports he has felt better than he has in a long time.  He reports he is getting along great with his sons and his main goal now is to reestablish contact with his 59 year old daughter.  He reports no side effects to his medications.  Discussed with him that we would not make any changes to his medications and send refills and he was agreeable with this.  He reports no SI, HI, or AVH.  He reports sleep is good.  He reports appetite is doing good.  He reports no other concerns at present.  He will return follow-up approximately 10 weeks.   Visit Diagnosis:    ICD-10-CM   1. GAD (generalized anxiety disorder)  F41.1 QUEtiapine (SEROQUEL) 50 MG tablet    sertraline (ZOLOFT) 25 MG tablet    2. Insomnia, unspecified type  G47.00 QUEtiapine (SEROQUEL) 50 MG tablet    sertraline (ZOLOFT) 25 MG tablet    3. Current moderate episode of major depressive disorder, unspecified whether recurrent (HCC)  F32.1 QUEtiapine (SEROQUEL) 50 MG tablet    sertraline (ZOLOFT) 25 MG tablet      Past Psychiatric History: Depression and Anxiety, and no Suicide Attempts, Self Injurious Behavior, or Hospitalizations.  Past Medical History:  Past Medical History:  Diagnosis Date   Anxiety    Depression    Hypertension     Past Surgical History:  Procedure Laterality Date   WISDOM TOOTH EXTRACTION      Family Psychiatric History: No Known Diagnosis', Substance Abuse,  or Suicides.   Family History:  Family History  Problem Relation Age of Onset   Hypertension Mother     Social History:  Social History   Socioeconomic History   Marital status: Significant Other    Spouse name: Not on file   Number of children: Not on file   Years of education: Not on file   Highest education level: Not on file  Occupational History   Not on file  Tobacco Use   Smoking status: Every Day    Packs/day: 1.00    Types: Cigarettes   Smokeless tobacco: Never  Vaping Use   Vaping Use: Never used  Substance and Sexual Activity   Alcohol use: No    Comment: stopped drinking   Drug use: Yes    Types: Marijuana   Sexual activity: Not on file  Other Topics Concern   Not on file  Social History Narrative   Not on file   Social Determinants of Health   Financial Resource Strain: Not on file  Food Insecurity: Not on file  Transportation Needs: Not on file  Physical Activity: Not on file  Stress: Not on file  Social Connections: Not on file    Allergies: No Known Allergies  Metabolic Disorder Labs: No results found for: "HGBA1C", "MPG" No results found for: "PROLACTIN" Lab Results  Component Value Date   CHOL 126 06/02/2022  TRIG 60 06/02/2022   HDL 44 06/02/2022   CHOLHDL 2.9 06/02/2022   LDLCALC 69 06/02/2022   No results found for: "TSH"  Therapeutic Level Labs: No results found for: "LITHIUM" No results found for: "VALPROATE" No results found for: "CBMZ"  Current Medications: Current Outpatient Medications  Medication Sig Dispense Refill   QUEtiapine (SEROQUEL) 50 MG tablet Take 1 tablet (50 mg total) by mouth at bedtime. 90 tablet 0   sertraline (ZOLOFT) 25 MG tablet Take 1 tablet (25 mg total) by mouth daily. 90 tablet 0   No current facility-administered medications for this visit.     Musculoskeletal: Strength & Muscle Tone: within normal limits Gait & Station: normal Patient leans: N/A  Psychiatric Specialty Exam: Review  of Systems  Respiratory:  Negative for cough and shortness of breath.   Cardiovascular:  Negative for chest pain.  Gastrointestinal:  Negative for abdominal pain, constipation, diarrhea, nausea and vomiting.  Neurological:  Negative for weakness and headaches.  Psychiatric/Behavioral:  Negative for dysphoric mood, hallucinations, sleep disturbance and suicidal ideas. The patient is not nervous/anxious.     Blood pressure 134/84, pulse 73, height '5\' 6"'$  (1.676 m), weight 107 lb 12.8 oz (48.9 kg), SpO2 100 %.Body mass index is 17.4 kg/m.  General Appearance: Casual and Fairly Groomed  Eye Contact:  Good  Speech:  Clear and Coherent and Normal Rate  Volume:  Normal  Mood:  Euthymic  Affect:  Appropriate and Congruent  Thought Process:  Coherent and Goal Directed  Orientation:  Full (Time, Place, and Person)  Thought Content: WDL and Logical   Suicidal Thoughts:  No  Homicidal Thoughts:  No  Memory:  Immediate;   Good Recent;   Good  Judgement:  Good  Insight:  Good  Psychomotor Activity:  Normal  Concentration:  Concentration: Good and Attention Span: Good  Recall:  Good  Fund of Knowledge: Good  Language: Good  Akathisia:  Negative  Handed:  Right  AIMS (if indicated): not done  Assets:  Communication Skills Desire for Improvement Housing Physical Health Resilience Social Support  ADL's:  Intact  Cognition: WNL  Sleep:  Good   Screenings: GAD-7    Flowsheet [place] Office Visit from [facility]/02/2022 in [facility]  Total GAD-7 Score 18      PHQ2-9    [place] Office Visit from 06/02/2022 in [place]  PHQ-2 Total Score 6  PHQ-9 Total Score 23      [place] ED from 02/15/2022 in [facility] ED from 09/23/2021 in [facility] ED from 06/09/2021 in [facility] [place] No Risk No Risk Error: Question 6  not populated        Assessment and Plan:  Cuauhtemoc L. Cooperstein is a 37 yr old male who presents for Follow Up and Medication Management.  PPHx is significant for Depression and Anxiety, and no Suicide Attempts, Self Injurious Behavior, or Hospitalizations.   Arjan continues to do well with his current medication regimen and dosing.  We will not make any changes to his medications at this time.  Refills were sent in.  He will return for follow-up in approximately 10 weeks.   MDD, Recurrant, Moderate  GAD: -Continue Zoloft 25 mg daily.  90 tablets with 0 refills. -Continue Seroquel 50 mg QHS for Augmentation, sleep, and appetite.  90 tablets with 0 refills.  Insomnia: -Continue Seroquel 50 mg QHS for Augmentation, sleep, and appetite.  90 tablets with 0 refills.    Collaboration of Care:   Patient/Guardian was advised Release of Information must be obtained prior to any record release in order to collaborate their care with an outside provider. Patient/Guardian was advised if they have not already done so to contact the registration department to sign all necessary forms in order for [place] to release information regarding their care.   Consent: Patient/Guardian gives verbal consent for treatment and assignment of benefits for services provided during this visit. Patient/Guardian expressed understanding and agreed to proceed.    Briant Cedar, MD 12/22/2022, 8:11 AM

## 2023-01-03 ENCOUNTER — Telehealth: Payer: Self-pay

## 2023-01-03 NOTE — Telephone Encounter (Signed)
Mychart msg sent

## 2023-01-05 ENCOUNTER — Other Ambulatory Visit: Payer: Self-pay

## 2023-01-06 ENCOUNTER — Emergency Department (HOSPITAL_COMMUNITY): Payer: Medicaid Other

## 2023-01-06 ENCOUNTER — Encounter (HOSPITAL_COMMUNITY): Payer: Self-pay

## 2023-01-06 ENCOUNTER — Emergency Department (HOSPITAL_COMMUNITY)
Admission: EM | Admit: 2023-01-06 | Discharge: 2023-01-06 | Disposition: A | Discharge status: Home / Self Care | Payer: Medicaid Other | Attending: Emergency Medicine | Admitting: Emergency Medicine

## 2023-01-06 DIAGNOSIS — I10 Essential (primary) hypertension: Secondary | ICD-10-CM | POA: Diagnosis not present

## 2023-01-06 DIAGNOSIS — R569 Unspecified convulsions: Secondary | ICD-10-CM | POA: Diagnosis not present

## 2023-01-06 LAB — CBC WITH DIFFERENTIAL/PLATELET
Abs Immature Granulocytes: 0.02 10*3/uL (ref 0.00–0.07)
Basophils Absolute: 0.1 10*3/uL (ref 0.0–0.1)
Basophils Relative: 1 %
Eosinophils Absolute: 0.1 10*3/uL (ref 0.0–0.5)
Eosinophils Relative: 2 %
HCT: 36.7 % — ABNORMAL LOW (ref 39.0–52.0)
Hemoglobin: 12.4 g/dL — ABNORMAL LOW (ref 13.0–17.0)
Immature Granulocytes: 0 %
Lymphocytes Relative: 23 %
Lymphs Abs: 1.8 10*3/uL (ref 0.7–4.0)
MCH: 30.9 pg (ref 26.0–34.0)
MCHC: 33.8 g/dL (ref 30.0–36.0)
MCV: 91.5 fL (ref 80.0–100.0)
Monocytes Absolute: 0.5 10*3/uL (ref 0.1–1.0)
Monocytes Relative: 6 %
Neutro Abs: 5.1 10*3/uL (ref 1.7–7.7)
Neutrophils Relative %: 68 %
Platelets: 257 10*3/uL (ref 150–400)
RBC: 4.01 MIL/uL — ABNORMAL LOW (ref 4.22–5.81)
RDW: 13.9 % (ref 11.5–15.5)
WBC: 7.6 10*3/uL (ref 4.0–10.5)
nRBC: 0 % (ref 0.0–0.2)

## 2023-01-06 LAB — COMPREHENSIVE METABOLIC PANEL
ALT: 8 U/L (ref 0–44)
AST: 18 U/L (ref 15–41)
Albumin: 3.3 g/dL — ABNORMAL LOW (ref 3.5–5.0)
Alkaline Phosphatase: 62 U/L (ref 38–126)
Anion gap: 6 (ref 5–15)
BUN: 5 mg/dL — ABNORMAL LOW (ref 6–20)
CO2: 26 mmol/L (ref 22–32)
Calcium: 9.1 mg/dL (ref 8.9–10.3)
Chloride: 107 mmol/L (ref 98–111)
Creatinine, Ser: 0.87 mg/dL (ref 0.61–1.24)
GFR, Estimated: >60 mL/min (ref >60)
Glucose, Bld: 93 mg/dL (ref 70–99)
Potassium: 3.9 mmol/L (ref 3.5–5.1)
Sodium: 139 mmol/L (ref 135–145)
Total Bilirubin: 0.5 mg/dL (ref 0.3–1.2)
Total Protein: 6.7 g/dL (ref 6.5–8.1)

## 2023-01-06 NOTE — ED Triage Notes (Signed)
Pt family states pt was asleep, let out a big yawn, began  shaking and foaming at the mouth; no hx seizures; episode lasted approx 3 minutes; lethargic and not answering questions after episode; pt a and o on arrival to ED; pt denies eoth/drug use, denies pain

## 2023-01-06 NOTE — ED Provider Notes (Signed)
[place] Provider Note   CSN: [id number] Arrival date & time: 01/06/23  [id number]     History  No chief complaint on file.   Maxwell Faulkner is a 37 y.o. male.  The history is provided by the patient, a significant other and medical records. No language interpreter was used.  Seizures Seizure activity on arrival: no   Seizure type:  Grand mal Initial focality:  None Postictal symptoms: confusion   Return to baseline: yes   Duration:  1 minute Timing:  Once Progression:  Resolved Context: not alcohol withdrawal, not sleeping less, not drug use, not family hx of seizures, not fever, not possible medication ingestion, not previous head injury and not stress   Recent head injury:  No recent head injuries PTA treatment:  None History of seizures: no        Home Medications Prior to Admission medications   Medication Sig Start Date End Date Taking? Authorizing Provider  QUEtiapine (SEROQUEL) 50 MG tablet Take 1 tablet (50 mg total) by mouth at bedtime. 12/22/22   Briant Cedar, MD  sertraline (ZOLOFT) 25 MG tablet Take 1 tablet (25 mg total) by mouth daily. 12/22/22   Briant Cedar, MD      Allergies    Patient has no known allergies.    Review of Systems   Review of Systems  Constitutional:  Negative for chills, fatigue and fever.  HENT:  Negative for congestion and rhinorrhea.   Eyes:  Negative for visual disturbance.  Respiratory:  Negative for cough, chest tightness, shortness of breath and wheezing.   Cardiovascular:  Negative for chest pain, palpitations and leg swelling.  Gastrointestinal:  Negative for abdominal pain, constipation, diarrhea, nausea and vomiting.  Genitourinary:  Negative for dysuria and flank pain.  Musculoskeletal:  Negative for back pain, neck pain and neck stiffness.  Skin:  Negative for rash and wound.  Neurological:  Positive for seizures. Negative for dizziness, syncope, speech  difficulty, weakness, light-headedness and headaches.  Psychiatric/Behavioral:  Negative for agitation and confusion.   All other systems reviewed and are negative.   Physical Exam Updated Vital Signs BP 115/79   Pulse 80   Temp 98 F (36.7 C)   Resp 20   Ht '5\' 6"'$  (1.676 m)   Wt 49.9 kg   SpO2 97%   BMI 17.75 kg/m  Physical Exam Vitals and nursing note reviewed.  Constitutional:      General: He is not in acute distress.    Appearance: He is well-developed. He is not ill-appearing, toxic-appearing or diaphoretic.  HENT:     Head: Normocephalic and atraumatic.     Nose: Nose normal. No congestion or rhinorrhea.     Mouth/Throat:     Mouth: Mucous membranes are moist.     Pharynx: No oropharyngeal exudate or posterior oropharyngeal erythema.  Eyes:     Conjunctiva/sclera: Conjunctivae normal.     Pupils: Pupils are equal, round, and reactive to light.  Cardiovascular:     Rate and Rhythm: Normal rate and regular rhythm.     Pulses: Normal pulses.     Heart sounds: No murmur heard. Pulmonary:     Effort: Pulmonary effort is normal. No respiratory distress.     Breath sounds: Normal breath sounds. No stridor. No wheezing, rhonchi or rales.  Chest:     Chest wall: No tenderness.  Abdominal:     General: Abdomen is flat. There is no distension.  Palpations: Abdomen is soft.     Tenderness: There is no abdominal tenderness. There is no right CVA tenderness, left CVA tenderness, guarding or rebound.  Musculoskeletal:        General: No swelling or tenderness.     Cervical back: Neck supple. No tenderness.     Right lower leg: No edema.     Left lower leg: No edema.  Skin:    General: Skin is warm and dry.     Capillary Refill: Capillary refill takes less than 2 seconds.     Coloration: Skin is not pale.     Findings: No erythema or rash.  Neurological:     General: No focal deficit present.     Mental Status: He is alert.     Sensory: No sensory deficit.      Motor: No weakness.  Psychiatric:        Mood and Affect: Mood normal.     ED Results / Procedures / Treatments   Labs (all labs ordered are listed, but only abnormal results are displayed) Labs Reviewed  CBC WITH DIFFERENTIAL/PLATELET - Abnormal; Notable for the following components:      Result Value   RBC 4.01 (*)    Hemoglobin 12.4 (*)    HCT 36.7 (*)    All other components within normal limits  COMPREHENSIVE METABOLIC PANEL - Abnormal; Notable for the following components:   BUN 5 (*)    Albumin 3.3 (*)    All other components within normal limits    EKG None  Radiology CT HEAD WO CONTRAST (5MM)  Result Date: 01/06/2023 CLINICAL DATA:  Seizure, new onset, no history of trauma. EXAM: CT HEAD WITHOUT CONTRAST TECHNIQUE: Contiguous axial images were obtained from the base of the skull through the vertex without intravenous contrast. RADIATION DOSE REDUCTION: This exam was performed according to the departmental dose-optimization program which includes automated exposure control, adjustment of the mA and/or kV according to patient size and/or use of iterative reconstruction technique. COMPARISON:  None Available. FINDINGS: Brain: No acute hemorrhage, mass effect or midline shift. Gray-white differentiation is preserved. No hydrocephalus. No extra-axial collection. Basilar cisterns are patent. Vascular: No hyperdense vessel or unexpected calcification. Skull: No calvarial fracture or suspicious bone lesion. Skull base is unremarkable. Sinuses/Orbits: Unremarkable. Other: None. IMPRESSION: No acute intracranial abnormality. Electronically Signed   By: Emmit Alexanders M.D.   On: 01/06/2023 10:58    Procedures Procedures    Medications Ordered in ED Medications - No data to display  ED Course/ Medical Decision Making/ A&P                             Medical Decision Making Amount and/or Complexity of Data Reviewed Labs: ordered. Radiology: ordered.    Maxwell Faulkner is a  37 y.o. male with a past medical history significant anxiety, depression, and hypertension who presents for possible seizure activity.  According to patient and significant other, patient has been completely at his baseline but this morning while waking up yawned he then had his eyes rolled back in his head, frothing of the mouth, and appeared to be having a seizure for about a minute.  It then resolved and patient was postictal.  No report of loss of bowel or bladder control.  Patient was confused briefly and then brought in and completely at his baseline.  He denies any headache, neck pain.  Denies any fevers, chills, neck stiffness.  Denies any congestion, cough or URI symptoms.  Denies any nausea, vomiting, constipation, diarrhea, or urinary changes.  Denies history of seizures.  Denies drug or alcohol use.  Denies any trauma.  Says he has never had any seizures in the past nor any family history of seizures.  No other medication use otherwise.  Patient is at his baseline and feels well.  On exam, lungs clear and chest nontender.  Abdomen nontender.  Patient moving all extremities.  No focal neurologic deficits.  Symmetric smile.  Clear speech.  Pupils symmetric and reactive and extract movements.  No carotid bruit.  No neck tenderness.  Normal range of motion of neck.  Patient observed for over 2 and half hours without any recurrent seizures.  Patient had head CT and some basic labs to look for significant bleed or abnormality.  Workup reassuring.  As is a first-time seizure, we agree with discharge without starting seizure medication and he will follow-up with outpatient neurology.  If patient has return of seizures, he may need to be started on seizure medication.  Patient and family agree with plan of care and he is feeling well.  He will stay hydrated and rest and follow-up with outpatient neurology.  He no questions or concerns and patient discharged in good condition.           Final  Clinical Impression(s) / ED Diagnoses Final diagnoses:  Seizure-like activity ([place])    Rx / DC Orders ED Discharge Orders     None       Clinical Impression: 1. Seizure-like activity ([place])     Disposition: Discharge  Condition: Good  I have discussed the results, Dx and Tx plan with the pt(& family if present). He/she/they expressed understanding and agree(s) with the plan. Discharge instructions discussed at great length. Strict return precautions discussed and pt &/or family have verbalized understanding of the instructions. No further questions at time of discharge.    Discharge Medication List as of 01/06/2023 11:36 AM      Follow Up: [place] [address]     [address] [place] [phone]  with neuro for follow up  [place] [address] [place] [phone] Schedule an appointment as soon as possible for a visit       Zaiyden Strozier, Gwenyth Allegra, MD 01/06/23 1148

## 2023-01-06 NOTE — Discharge Instructions (Signed)
Your history, exam, and evaluation today are suggestive of a single seizure-like episode earlier this morning but then you proved stability for over 2-1/2 hours without any more symptoms.  Your labs and imaging were reassuring.  As this is a first-time seizure, vitals are reassuring, we feel you are safe for discharge home with outpatient follow-up.  If any symptoms recur, change, or worsen, please return to the nearest emergency department.  Please rest and stay hydrated and follow-up.

## 2023-02-27 ENCOUNTER — Encounter (HOSPITAL_COMMUNITY): Payer: Self-pay

## 2023-02-27 ENCOUNTER — Emergency Department (HOSPITAL_COMMUNITY)
Admission: EM | Admit: 2023-02-27 | Discharge: 2023-02-28 | Disposition: A | Discharge status: Home / Self Care | Payer: Medicaid Other | Attending: Emergency Medicine | Admitting: Emergency Medicine

## 2023-02-27 ENCOUNTER — Emergency Department (HOSPITAL_COMMUNITY): Payer: Medicaid Other

## 2023-02-27 DIAGNOSIS — S99921A Unspecified injury of right foot, initial encounter: Secondary | ICD-10-CM | POA: Insufficient documentation

## 2023-02-27 DIAGNOSIS — M79671 Pain in right foot: Secondary | ICD-10-CM | POA: Diagnosis not present

## 2023-02-27 DIAGNOSIS — W228XXA Striking against or struck by other objects, initial encounter: Secondary | ICD-10-CM | POA: Insufficient documentation

## 2023-02-27 MED ORDER — IBUPROFEN 800 MG PO TABS
800.0000 mg | ORAL_TABLET | Freq: Once | ORAL | Status: AC
Start: 2023-02-27 — End: 2023-02-27
  Administered 2023-02-27: 800 mg via ORAL
  Filled 2023-02-27: qty 1

## 2023-02-27 MED ORDER — IBUPROFEN 400 MG PO TABS: 400.0000 mg | ORAL_TABLET | Freq: Four times a day (QID) | ORAL | 0 refills | Status: DC | PRN

## 2023-02-27 NOTE — Discharge Instructions (Addendum)
It was a pleasure caring for you today. Foot xray was without concern for fractures or dislocations. As discussed, alternating Tylenol, Advil and ice packs should provide adequate pain relief.  Alternating between 650 mg Tylenol and 400 mg Advil: The best way to alternate taking Acetaminophen (example Tylenol) and Ibuprofen (example Advil/Motrin) is to take them 3 hours apart. For example, if you take ibuprofen at 6 am you can then take Tylenol at 9 am. You can continue this regimen throughout the day, making sure you do not exceed the recommended maximum dose for each drug.   Seek emergency care if experiencing any new or worsening symptoms.

## 2023-02-27 NOTE — ED Provider Notes (Incomplete)
[place] EMERGENCY DEPARTMENT AT [facility] Provider Note   CSN: [id number] Arrival date & time: 02/27/23  2233     History {Add pertinent medical, surgical, social history, OB history to HPI:1} Chief Complaint  Patient presents with  . Foot Injury    Maxwell Faulkner is a 37 y.o. male who presents to ED complaining of right foot pain. Patient was carrying a steel door about 3 hours ago when it slid and dropped on his foot. Patient states he is not able to walk on right foot. Denies chest pain, dyspnea, extremity paresthesias.   Foot Injury      Home Medications Prior to Admission medications   Medication Sig Start Date End Date Taking? Authorizing Provider  QUEtiapine (SEROQUEL) 50 MG tablet Take 1 tablet (50 mg total) by mouth at bedtime. 12/22/22   Lauro Franklin, MD  sertraline (ZOLOFT) 25 MG tablet Take 1 tablet (25 mg total) by mouth daily. 12/22/22   Lauro Franklin, MD      Allergies    Patient has no known allergies.    Review of Systems   Review of Systems  Musculoskeletal:        Right foot pain    Physical Exam Updated Vital Signs BP (!) 132/102 (BP Location: Left Arm)   Pulse 70   Temp 98.1 F (36.7 C) (Oral)   Resp 16   Ht 5\' 6"  (1.676 m)   Wt 49.9 kg   SpO2 100%   BMI 17.75 kg/m  Physical Exam Vitals and nursing note reviewed.  Constitutional:      General: He is not in acute distress.    Appearance: He is not ill-appearing or toxic-appearing.  HENT:     Head: Normocephalic and atraumatic.  Eyes:     General: No scleral icterus.       Right eye: No discharge.        Left eye: No discharge.     Conjunctiva/sclera: Conjunctivae normal.  Cardiovascular:     Rate and Rhythm: Normal rate.  Pulmonary:     Effort: Pulmonary effort is normal.  Abdominal:     General: Abdomen is flat.  Musculoskeletal:        General: Tenderness present. No swelling or deformity.     Right lower leg: No edema.     Left lower leg: No  edema.     Comments: R ankle: no swelling; ROM without restriction. R foot: severe tenderness to 1st, 2nd, and 3rd toe. ROM restricted d/t pain. Right foot is neurovascularly intact.  Skin:    General: Skin is warm and dry.     Capillary Refill: Capillary refill takes less than 2 seconds.  Neurological:     General: No focal deficit present.     Mental Status: He is alert. Mental status is at baseline.     Sensory: No sensory deficit.  Psychiatric:        Mood and Affect: Mood normal.        Behavior: Behavior normal.     ED Results / Procedures / Treatments   Labs (all labs ordered are listed, but only abnormal results are displayed) Labs Reviewed - No data to display  EKG None  Radiology No results found.  Procedures Procedures  {Document cardiac monitor, telemetry assessment procedure when appropriate:1}  Medications Ordered in ED Medications - No data to display  ED Course/ Medical Decision Making/ A&P   {   Click here for ABCD2, HEART and  other calculatorsREFRESH Note before signing :1}                          Medical Decision Making Amount and/or Complexity of Data Reviewed Radiology: ordered.   This patient presents to the ED for concern of right foot pain, this involves an extensive number of treatment options, and is a complaint that carries with it a high risk of complications and morbidity.  The differential diagnosis includes hemarthrosis, gout, septic joint, fracture   Co morbidities that complicate the patient evaluation  none   Additional history obtained:  none   Imaging Studies ordered:  I ordered imaging studies including foot Xray - no concerns for fractures or dislocations I independently visualized and interpreted imaging Shared findings with patient I agree with the radiologist interpretation   Problem List / ED Course / Critical interventions / Medication management  Patient presents to ED with right foot pain after a steel  door fell on it around 3 hours ago. Foot xray without concern for fracture. Physical exam unremarkable and bilateral feet neurovascularly intact. I shared results with patient and educated patient on pain control. Vital signs stable and patient ready for discharge. Provided patient with strict return precautions. I have reviewed the patients home medicines and have made adjustments as needed   Ddx these are considered less likely due to history of present illness and physical exam -hemarthrosis: joint without swelling -gout: no warmth, erythema, or swelling -septic joint: afebrile; no warmth or erythema; no skin changes    Social Determinants of Health:  none     {Document critical care time when appropriate:1} {Document review of labs and clinical decision tools ie heart score, Chads2Vasc2 etc:1}  {Document your independent review of radiology images, and any outside records:1} {Document your discussion with family members, caretakers, and with consultants:1} {Document social determinants of health affecting pt's care:1} {Document your decision making why or why not admission, treatments were needed:1} Final Clinical Impression(s) / ED Diagnoses Final diagnoses:  Injury of right foot, initial encounter    Rx / DC Orders ED Discharge Orders     None

## 2023-02-27 NOTE — ED Triage Notes (Signed)
Pt arrived POV after dropping a steal door on his right foot earlier tonight. Pt reports the pain started to increased once he laid down. Pt unable to bear weight to right foot. CNS intact to right foot, pedal pulse +3. Pt reports painful to touch around his toes. VSS, NAD noted.

## 2023-02-27 NOTE — ED Provider Notes (Signed)
[place] EMERGENCY DEPARTMENT AT [facility] Provider Note   CSN: [id number] Arrival date & time: 02/27/23  2233     History  Chief Complaint  Patient presents with   Foot Injury    Maxwell Faulkner is a 37 y.o. male who presents to ED complaining of right foot pain. Patient was carrying a steel door about 3 hours ago when it slid and dropped on his foot. Patient states he is not able to walk on right foot. Denies chest pain, dyspnea, extremity paresthesias.   Foot Injury      Home Medications Prior to Admission medications   Medication Sig Start Date End Date Taking? Authorizing Provider  ibuprofen (ADVIL) 400 MG tablet Take 1 tablet (400 mg total) by mouth every 6 (six) hours as needed. 02/27/23  Yes Valrie Hart F, PA-C  QUEtiapine (SEROQUEL) 50 MG tablet Take 1 tablet (50 mg total) by mouth at bedtime. 12/22/22   Lauro Franklin, MD  sertraline (ZOLOFT) 25 MG tablet Take 1 tablet (25 mg total) by mouth daily. 12/22/22   Lauro Franklin, MD      Allergies    Patient has no known allergies.    Review of Systems   Review of Systems  Musculoskeletal:        Right foot pain    Physical Exam Updated Vital Signs BP (!) 132/102 (BP Location: Left Arm)   Pulse 70   Temp 98.1 F (36.7 C) (Oral)   Resp 16   Ht 5\' 6"  (1.676 m)   Wt 49.9 kg   SpO2 100%   BMI 17.75 kg/m  Physical Exam Vitals and nursing note reviewed.  Constitutional:      General: He is not in acute distress.    Appearance: He is not ill-appearing or toxic-appearing.  HENT:     Head: Normocephalic and atraumatic.  Eyes:     General: No scleral icterus.       Right eye: No discharge.        Left eye: No discharge.     Conjunctiva/sclera: Conjunctivae normal.  Cardiovascular:     Rate and Rhythm: Normal rate.  Pulmonary:     Effort: Pulmonary effort is normal.  Abdominal:     General: Abdomen is flat.  Musculoskeletal:        General: Tenderness present. No  swelling or deformity.     Right lower leg: No edema.     Left lower leg: No edema.     Comments: R ankle: no swelling; ROM without restriction. R foot: severe tenderness to 1st, 2nd, and 3rd toe. ROM restricted d/t pain. Right foot is neurovascularly intact.  Skin:    General: Skin is warm and dry.     Capillary Refill: Capillary refill takes less than 2 seconds.  Neurological:     General: No focal deficit present.     Mental Status: He is alert. Mental status is at baseline.     Sensory: No sensory deficit.  Psychiatric:        Mood and Affect: Mood normal.        Behavior: Behavior normal.     ED Results / Procedures / Treatments   Labs (all labs ordered are listed, but only abnormal results are displayed) Labs Reviewed - No data to display  EKG None  Radiology DG Foot Complete Right  Result Date: 02/27/2023 CLINICAL DATA:  Heavy object fell on the patient's foot. Diffuse forefoot pain. EXAM: RIGHT FOOT COMPLETE -  3+ VIEW COMPARISON:  None Available. FINDINGS: There is no evidence of fracture or dislocation. There is no evidence of arthropathy or other focal bone abnormality. Soft tissues are unremarkable. IMPRESSION: Negative. Electronically Signed   By: Almira Bar M.D.   On: 02/27/2023 23:11    Procedures Procedures    Medications Ordered in ED Medications  ibuprofen (ADVIL) tablet 800 mg (800 mg Oral Given 02/27/23 2351)    ED Course/ Medical Decision Making/ A&P                             Medical Decision Making Amount and/or Complexity of Data Reviewed Radiology: ordered.   This patient presents to the ED for concern of right foot pain, this involves an extensive number of treatment options, and is a complaint that carries with it a high risk of complications and morbidity.  The differential diagnosis includes hemarthrosis, gout, septic joint, fracture   Co morbidities that complicate the patient evaluation  none   Additional history  obtained:  none   Imaging Studies ordered:  I ordered imaging studies including foot Xray - no concerns for fractures or dislocations I independently visualized and interpreted imaging Shared findings with patient I agree with the radiologist interpretation   Problem List / ED Course / Critical interventions / Medication management  Patient presents to ED with right foot pain after a steel door fell on it around 3 hours ago. Foot xray without concern for fracture. Physical exam unremarkable and bilateral feet neurovascularly intact. I shared results with patient and educated patient on pain control. Prescribed Ibuprofen and gave dose of pain management in ED. Provided patient with crutches and post op boot. Vital signs stable and patient ready for discharge. Provided patient with strict return precautions. I have reviewed the patients home medicines and have made adjustments as needed   Ddx these are considered less likely due to history of present illness and physical exam -hemarthrosis: joint without swelling -gout: no warmth, erythema, or swelling -septic joint: afebrile; no warmth or erythema; no skin changes    Social Determinants of Health:  none           Final Clinical Impression(s) / ED Diagnoses Final diagnoses:  Injury of right foot, initial encounter    Rx / DC Orders ED Discharge Orders          Ordered    ibuprofen (ADVIL) 400 MG tablet  Every 6 hours PRN        02/27/23 2333              Dorthy Cooler, PA-C 02/28/23 0016    Linwood Dibbles, MD 03/02/23 [phone]

## 2023-02-28 DIAGNOSIS — S99821A Other specified injuries of right foot, initial encounter: Secondary | ICD-10-CM | POA: Diagnosis not present

## 2023-03-14 ENCOUNTER — Encounter (HOSPITAL_COMMUNITY): Payer: Self-pay

## 2023-03-14 ENCOUNTER — Other Ambulatory Visit: Payer: Self-pay

## 2023-03-14 ENCOUNTER — Emergency Department (HOSPITAL_COMMUNITY)
Admission: EM | Admit: 2023-03-14 | Discharge: 2023-03-14 | Disposition: A | Discharge status: Home / Self Care | Payer: Medicaid Other | Attending: Emergency Medicine | Admitting: Emergency Medicine

## 2023-03-14 DIAGNOSIS — I10 Essential (primary) hypertension: Secondary | ICD-10-CM | POA: Diagnosis not present

## 2023-03-14 DIAGNOSIS — G40909 Epilepsy, unspecified, not intractable, without status epilepticus: Secondary | ICD-10-CM | POA: Diagnosis not present

## 2023-03-14 DIAGNOSIS — R569 Unspecified convulsions: Secondary | ICD-10-CM | POA: Insufficient documentation

## 2023-03-14 LAB — COMPREHENSIVE METABOLIC PANEL
ALT: 10 U/L (ref 0–44)
AST: 17 U/L (ref 15–41)
Albumin: 3.9 g/dL (ref 3.5–5.0)
Alkaline Phosphatase: 58 U/L (ref 38–126)
Anion gap: 7 (ref 5–15)
BUN: 12 mg/dL (ref 6–20)
CO2: 24 mmol/L (ref 22–32)
Calcium: 8.9 mg/dL (ref 8.9–10.3)
Chloride: 110 mmol/L (ref 98–111)
Creatinine, Ser: 0.75 mg/dL (ref 0.61–1.24)
GFR, Estimated: >60 mL/min (ref >60)
Glucose, Bld: 94 mg/dL (ref 70–99)
Potassium: 3.8 mmol/L (ref 3.5–5.1)
Sodium: 141 mmol/L (ref 135–145)
Total Bilirubin: 0.4 mg/dL (ref 0.3–1.2)
Total Protein: 7.1 g/dL (ref 6.5–8.1)

## 2023-03-14 MED ORDER — LEVETIRACETAM 500 MG PO TABS: 500.0000 mg | ORAL_TABLET | Freq: Two times a day (BID) | ORAL | 0 refills | Status: DC

## 2023-03-14 MED ORDER — ACETAMINOPHEN 325 MG PO TABS
650.0000 mg | ORAL_TABLET | Freq: Once | ORAL | Status: AC
  Administered 2023-03-14: 650 mg via ORAL
  Filled 2023-03-14: qty 2

## 2023-03-14 MED ORDER — LEVETIRACETAM IN NACL 1500 MG/100ML IV SOLN
1500.0000 mg | Freq: Once | INTRAVENOUS | Status: AC
  Administered 2023-03-14: 1500 mg via INTRAVENOUS
  Filled 2023-03-14: qty 100

## 2023-03-14 NOTE — Discharge Instructions (Signed)
Follow-up with the neurologist as you have been instructed °

## 2023-03-14 NOTE — ED Triage Notes (Signed)
C/o 3 witnessed seizures since waking this am  Denies hitting head.  Pt reports no seizure meds prescribed and suppose to see neurologist for first time this month Denies blood thinner usage.

## 2023-03-14 NOTE — ED Provider Notes (Signed)
[place] EMERGENCY DEPARTMENT AT [facility] Provider Note   CSN: [id number] Arrival date & time: 03/14/23  1013     History  Chief Complaint  Patient presents with   Seizures    COMER BUYER is a 37 y.o. male.  37 year old male with history of seizures presents after having witnessed seizure by his partner.  Scheduled to see an neurologist in 2 months.  Does not take any antiepileptic at this time.  Patient's last CT scan of his head was back in March and was negative.  Is unsure of the inciting etiology of his symptoms.  No recent fever or chills.  No recent drug use.  Denies any tongue biting or loss of bladder function.  Is back to his baseline at this time.       Home Medications Prior to Admission medications   Medication Sig Start Date End Date Taking? Authorizing Provider  ibuprofen (ADVIL) 400 MG tablet Take 1 tablet (400 mg total) by mouth every 6 (six) hours as needed. 02/27/23   Dorthy Cooler, PA-C  QUEtiapine (SEROQUEL) 50 MG tablet Take 1 tablet (50 mg total) by mouth at bedtime. 12/22/22   Lauro Franklin, MD  sertraline (ZOLOFT) 25 MG tablet Take 1 tablet (25 mg total) by mouth daily. 12/22/22   Lauro Franklin, MD      Allergies    Patient has no known allergies.    Review of Systems   Review of Systems  All other systems reviewed and are negative.   Physical Exam Updated Vital Signs BP 130/81   Pulse 76   Temp 97.6 F (36.4 C) (Oral)   Resp 19   Ht 1.676 m (5\' 6" )   Wt 49.9 kg   SpO2 100%   BMI 17.75 kg/m  Physical Exam Vitals and nursing note reviewed.  Constitutional:      General: He is not in acute distress.    Appearance: Normal appearance. He is well-developed. He is not toxic-appearing.  HENT:     Head: Normocephalic and atraumatic.  Eyes:     General: Lids are normal.     Conjunctiva/sclera: Conjunctivae normal.     Pupils: Pupils are equal, round, and reactive to light.  Neck:     Thyroid: No  thyroid mass.     Trachea: No tracheal deviation.  Cardiovascular:     Rate and Rhythm: Normal rate and regular rhythm.     Heart sounds: Normal heart sounds. No murmur heard.    No gallop.  Pulmonary:     Effort: Pulmonary effort is normal. No respiratory distress.     Breath sounds: Normal breath sounds. No stridor. No decreased breath sounds, wheezing, rhonchi or rales.  Abdominal:     General: There is no distension.     Palpations: Abdomen is soft.     Tenderness: There is no abdominal tenderness. There is no rebound.  Musculoskeletal:        General: No tenderness. Normal range of motion.     Cervical back: Normal range of motion and neck supple.  Skin:    General: Skin is warm and dry.     Findings: No abrasion or rash.  Neurological:     General: No focal deficit present.     Mental Status: He is alert and oriented to person, place, and time. Mental status is at baseline.     GCS: GCS eye subscore is 4. GCS verbal subscore is 5. GCS motor subscore is  6.     Cranial Nerves: No cranial nerve deficit.     Sensory: No sensory deficit.     Motor: Motor function is intact.  Psychiatric:        Attention and Perception: Attention normal.        Speech: Speech normal.        Behavior: Behavior normal.     ED Results / Procedures / Treatments   Labs (all labs ordered are listed, but only abnormal results are displayed) Labs Reviewed  COMPREHENSIVE METABOLIC PANEL    EKG EKG Interpretation  Date/Time:  Wednesday Mar 14 2023 10:28:19 EDT Ventricular Rate:  99 PR Interval:  132 QRS Duration: 86 QT Interval:  332 QTC Calculation: 426 R Axis:   100 Text Interpretation: Sinus rhythm Probable left atrial enlargement Right axis deviation ST elev, probable normal early repol pattern Confirmed by Lorre Nick ([id number]) on 03/14/2023 12:38:40 PM  Radiology No results found.  Procedures Procedures    Medications Ordered in ED Medications  levETIRAcetam (KEPPRA) IVPB  1500 mg/ 100 mL premix (1,500 mg Intravenous New Bag/Given 03/14/23 1115)  acetaminophen (TYLENOL) tablet 650 mg (650 mg Oral Given 03/14/23 1111)    ED Course/ Medical Decision Making/ A&P                             Medical Decision Making Amount and/or Complexity of Data Reviewed Labs: ordered.  Risk OTC drugs. Prescription drug management.   Patient is EKG per interpretation shows no signs of acute ischemic changes. Patient's stable here.  Was loaded with Keppra here.  Will discharge home and start on Keppra no seizure activity here.        Final Clinical Impression(s) / ED Diagnoses Final diagnoses:  None    Rx / DC Orders ED Discharge Orders     None         Lorre Nick, MD 03/14/23 1239

## 2023-03-21 ENCOUNTER — Encounter: Payer: Medicaid Other | Admitting: Diagnostic Neuroimaging

## 2023-03-22 ENCOUNTER — Encounter (HOSPITAL_COMMUNITY): Payer: Medicaid Other | Admitting: Student in an Organized Health Care Education/Training Program

## 2023-03-22 NOTE — Progress Notes (Deleted)
[facility] MD/PA/NP OP Progress Note  03/22/2023 7:23 AM Maxwell Faulkner  MRN:  [id number]  Chief Complaint:  No chief complaint on file.  HPI:  Maxwell Faulkner. Maxwell Faulkner is a 37 yr old male who presents for Follow Up and Medication Management.  PPHx is significant for Depression and Anxiety, and no Suicide Attempts, Self Injurious Behavior, or Hospitalizations.   He reports ***   Visit Diagnosis:  No diagnosis found.   Past Psychiatric History: Depression and Anxiety, and no Suicide Attempts, Self Injurious Behavior, or Hospitalizations.  Past Medical History:  Past Medical History:  Diagnosis Date  . Anxiety   . Depression   . Hypertension     Past Surgical History:  Procedure Laterality Date  . WISDOM TOOTH EXTRACTION      Family Psychiatric History: No Known Diagnosis', Substance Abuse, or Suicides.   Family History:  Family History  Problem Relation Age of Onset  . Hypertension Mother     Social History:  Social History   Socioeconomic History  . Marital status: Significant Other    Spouse name: Not on file  . Number of children: Not on file  . Years of education: Not on file  . Highest education level: Not on file  Occupational History  . Not on file  Tobacco Use  . Smoking status: Every Day    Packs/day: 1    Types: Cigarettes  . Smokeless tobacco: Never  Vaping Use  . Vaping Use: Never used  Substance and Sexual Activity  . Alcohol use: No    Comment: stopped drinking  . Drug use: Yes    Types: Marijuana  . Sexual activity: Not on file  Other Topics Concern  . Not on file  Social History Narrative  . Not on file   Social Determinants of Health   Financial Resource Strain: Not on file  Food Insecurity: Not on file  Transportation Needs: No Transportation Needs (01/05/2023)   PRAPARE - Transportation   . Lack of Transportation (Medical): No   . Lack of Transportation (Non-Medical): No  Physical Activity: Not on file  Stress: Not on file  Social  Connections: Not on file    Allergies: No Known Allergies  Metabolic Disorder Labs: No results found for: "HGBA1C", "MPG" No results found for: "PROLACTIN" Lab Results  Component Value Date   CHOL 126 06/02/2022   TRIG 60 06/02/2022   HDL 44 06/02/2022   CHOLHDL 2.9 06/02/2022   LDLCALC 69 06/02/2022   No results found for: "TSH"  Therapeutic Level Labs: No results found for: "LITHIUM" No results found for: "VALPROATE" No results found for: "CBMZ"  Current Medications: Current Outpatient Medications  Medication Sig Dispense Refill  . ibuprofen (ADVIL) 400 MG tablet Take 1 tablet (400 mg total) by mouth every 6 (six) hours as needed. 30 tablet 0  . levETIRAcetam (KEPPRA) 500 MG tablet Take 1 tablet (500 mg total) by mouth 2 (two) times daily. 60 tablet 0  . QUEtiapine (SEROQUEL) 50 MG tablet Take 1 tablet (50 mg total) by mouth at bedtime. 90 tablet 0  . sertraline (ZOLOFT) 25 MG tablet Take 1 tablet (25 mg total) by mouth daily. 90 tablet 0   No current facility-administered medications for this visit.     Musculoskeletal: Strength & Muscle Tone: within normal limits Gait & Station: normal Patient leans: N/A *** Psychiatric Specialty Exam: Review of Systems   There were no vitals taken for this visit.There is no height or weight on file to calculate  BMI.  General Appearance: Casual and Fairly Groomed  Eye Contact:  Good  Speech:  Clear and Coherent and Normal Rate  Volume:  Normal  Mood:  Euthymic  Affect:  Appropriate and Congruent  Thought Process:  Coherent and Goal Directed  Orientation:  Full (Time, Place, and Person)  Thought Content: WDL and Logical   Suicidal Thoughts:  No  Homicidal Thoughts:  No  Memory:  Immediate;   Good Recent;   Good  Judgement:  Good  Insight:  Good  Psychomotor Activity:  Normal  Concentration:  Concentration: Good and Attention Span: Good  Recall:  Good  Fund of Knowledge: Good  Language: Good  Akathisia:  Negative   Handed:  Right  AIMS (if indicated): not done  Assets:  Communication Skills Desire for Improvement Housing Physical Health Resilience Social Support  ADL's:  Intact  Cognition: WNL  Sleep:  Good   Screenings: GAD-7    Flowsheet Row Office Visit from 06/02/2022 in [facility]  Total GAD-7 Score 18      PHQ2-9    Flowsheet Row Office Visit from 06/02/2022 in [facility]  PHQ-2 Total Score 6  PHQ-9 Total Score 23      Flowsheet Row ED from 03/14/2023 in [facility] Emergency Department at [facility] ED from 02/27/2023 in [facility] Emergency Department at [facility] ED from 01/06/2023 in [facility] Emergency Department at [facility]  C-SSRS RISK CATEGORY No Risk No Risk No Risk        Assessment and Plan:  Maxwell Faulkner is a 37 yr old male who presents for Follow Up and Medication Management.  PPHx is significant for Depression and Anxiety, and no Suicide Attempts, Self Injurious Behavior, or Hospitalizations.   [place] ***   MDD, Recurrant, Moderate  GAD: -Continue Zoloft 25 mg daily.  90 tablets with 0 refills. -Continue Seroquel 50 mg QHS for Augmentation, sleep, and appetite.  90 tablets with 0 refills.     Insomnia: -Continue Seroquel 50 mg QHS for Augmentation, sleep, and appetite.  90 tablets with 0 refills.    Collaboration of Care:   Patient/Guardian was advised Release of Information must be obtained prior to any record release in order to collaborate their care with an outside provider. Patient/Guardian was advised if they have not already done so to contact the registration department to sign all necessary forms in order for [place] to release information regarding their care.   Consent: Patient/Guardian gives verbal consent for treatment and assignment of benefits for services provided during this visit. Patient/Guardian expressed understanding and agreed to proceed.     Lauro Franklin, MD 03/22/2023, 7:23 AM

## 2023-03-22 NOTE — Progress Notes (Signed)
This encounter was created in error - please disregard.

## 2023-03-29 ENCOUNTER — Ambulatory Visit: Payer: Self-pay | Admitting: Nurse Practitioner

## 2023-04-09 ENCOUNTER — Encounter: Payer: Self-pay | Admitting: Neurology

## 2023-04-09 ENCOUNTER — Ambulatory Visit: Payer: Medicaid Other | Admitting: Neurology

## 2023-04-09 VITALS — BP 106/66 | HR 80 | Ht 66.0 in | Wt 107.5 lb

## 2023-04-09 DIAGNOSIS — G40909 Epilepsy, unspecified, not intractable, without status epilepticus: Secondary | ICD-10-CM | POA: Diagnosis not present

## 2023-04-09 MED ORDER — VALTOCO 15 MG DOSE 7.5 MG/0.1ML NA LQPK: 15.0000 mg | NASAL | 5 refills | Status: DC | PRN

## 2023-04-09 MED ORDER — CYCLOBENZAPRINE HCL 10 MG PO TABS: 10.0000 mg | ORAL_TABLET | Freq: Two times a day (BID) | ORAL | 0 refills | Status: DC | PRN

## 2023-04-09 MED ORDER — DIVALPROEX SODIUM 500 MG PO DR TAB: 500.0000 mg | DELAYED_RELEASE_TABLET | Freq: Two times a day (BID) | ORAL | 6 refills | Status: DC

## 2023-04-09 NOTE — Patient Instructions (Addendum)
Discontinue Keppra  Start Depakote 500 mg (1 tablet) twice daily for one week then increase to 1000 mg (2 tablets) twice daily  Continue with Quetiapine ad Sertraline as directed  Follow up in 3 months for blood work
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
## 2023-04-10 ENCOUNTER — Telehealth: Payer: Self-pay | Admitting: Neurology

## 2023-04-10 ENCOUNTER — Ambulatory Visit: Payer: Medicaid Other | Admitting: Neurology

## 2023-04-10 NOTE — Telephone Encounter (Signed)
Returned call to pt who stated had sz this am at 7:40am for 2-3 mins. He stated that he was out of valtoco and that Dr. Teresa Coombs gave to him for having sz. Stated hasn't missed any meds.

## 2023-04-10 NOTE — Telephone Encounter (Signed)
Valtoco is as needed only. I am not sure if he was able to pick it up since it was prescribed yesterday. He has 5 refills left.

## 2023-04-10 NOTE — Telephone Encounter (Signed)
Pt called wanting to inform the provider that this morning he had another seizure and would like to discuss. Please advise.

## 2023-04-10 NOTE — Telephone Encounter (Signed)
Please have him continue with the Depakote 2 tablets twice daily.

## 2023-04-18 ENCOUNTER — Ambulatory Visit: Payer: Medicaid Other | Admitting: Neurology

## 2023-04-18 DIAGNOSIS — G40909 Epilepsy, unspecified, not intractable, without status epilepticus: Secondary | ICD-10-CM

## 2023-04-18 NOTE — Procedures (Signed)
    History:  37 year old man with epilepsy   EEG classification: Awake and drowsy  Description of the recording: The background rhythms of this recording consists of a fairly well modulated medium amplitude alpha rhythm of 9-10 Hz that is reactive to eye opening and closure. Present in the anterior head region is a 15-20 Hz beta activity. Photic stimulation was performed, did not show any abnormalities. Hyperventilation was also performed, did not show any abnormalities. Drowsiness was manifested by background fragmentation. No abnormal epileptiform discharges seen during this recording. There was no focal slowing. There were no electrographic seizure identified.   Abnormality: None   Impression: This is a normal EEG recorded while drowsy and awake. No evidence of interictal epileptiform discharges. Normal EEGs, however, do not rule out epilepsy.    Windell Norfolk, MD [facility]

## 2023-04-20 ENCOUNTER — Emergency Department (HOSPITAL_COMMUNITY)
Admission: EM | Admit: 2023-04-20 | Discharge: 2023-04-21 | Disposition: A | Discharge status: Home / Self Care | Payer: Medicaid Other | Attending: Emergency Medicine | Admitting: Emergency Medicine

## 2023-04-20 DIAGNOSIS — L03211 Cellulitis of face: Secondary | ICD-10-CM | POA: Diagnosis not present

## 2023-04-20 DIAGNOSIS — R220 Localized swelling, mass and lump, head: Secondary | ICD-10-CM | POA: Diagnosis present

## 2023-04-20 DIAGNOSIS — R609 Edema, unspecified: Secondary | ICD-10-CM | POA: Diagnosis not present

## 2023-04-20 DIAGNOSIS — I10 Essential (primary) hypertension: Secondary | ICD-10-CM | POA: Diagnosis not present

## 2023-04-20 MED ORDER — SODIUM CHLORIDE 0.9 % IV BOLUS
1000.0000 mL | Freq: Once | INTRAVENOUS | Status: AC
  Administered 2023-04-21: 1000 mL via INTRAVENOUS

## 2023-04-20 NOTE — ED Provider Notes (Signed)
[place] EMERGENCY DEPARTMENT AT [facility] Provider Note   CSN: [id number] Arrival date & time: 04/20/23  2213     History {Add pertinent medical, surgical, social history, OB history to HPI:1} Chief Complaint  Patient presents with   Dental Pain    Maxwell Faulkner is a 37 y.o. male.  37 year old male presents with complaint of left-sided facial swelling after wisdom tooth extraction yesterday.  Reports having all 4 teeth extracted, left-sided facial swelling worse today.  Is taking amoxicillin as prescribed by his dentist and rinsing with saline.  No fevers, no drainage.       Home Medications Prior to Admission medications   Medication Sig Start Date End Date Taking? Authorizing Provider  cyclobenzaprine (FLEXERIL) 10 MG tablet Take 1 tablet (10 mg total) by mouth 2 (two) times daily as needed for muscle spasms. 04/09/23   Windell Norfolk, MD  diazePAM, 15 MG Dose, (VALTOCO 15 MG DOSE) 2 x 7.5 MG/0.1ML LQPK Place 15 mg into the nose as needed (For seizure lasting more than 5 minutes). 04/09/23   Windell Norfolk, MD  divalproex (DEPAKOTE) 500 MG DR tablet Take 1 tablet (500 mg total) by mouth 2 (two) times daily. 04/09/23 11/05/23  Windell Norfolk, MD  QUEtiapine (SEROQUEL) 50 MG tablet Take 1 tablet (50 mg total) by mouth at bedtime. 12/22/22   Lauro Franklin, MD  sertraline (ZOLOFT) 25 MG tablet Take 1 tablet (25 mg total) by mouth daily. 12/22/22   Lauro Franklin, MD      Allergies    Patient has no known allergies.    Review of Systems   Review of Systems Negative except as per HPI Physical Exam Updated Vital Signs BP (!) 154/94   Pulse 74   Temp 98.4 F (36.9 C) (Oral)   Resp 17   Ht 5\' 6"  (1.676 m)   Wt 49.9 kg   SpO2 100%   BMI 17.75 kg/m  Physical Exam Vitals and nursing note reviewed.  Constitutional:      General: He is not in acute distress.    Appearance: He is well-developed. He is not diaphoretic.  HENT:     Head: Normocephalic  and atraumatic.     Jaw: Trismus present.     Mouth/Throat:     Mouth: Mucous membranes are moist.     Dentition: Gingival swelling present.      Comments: Left cheek swelling without overlying erythema. Edema of gingiva adjacent to left buccal mandible  Pulmonary:     Effort: Pulmonary effort is normal.  Skin:    General: Skin is warm and dry.     Findings: No erythema or rash.  Neurological:     Mental Status: He is alert and oriented to person, place, and time.  Psychiatric:        Behavior: Behavior normal.     ED Results / Procedures / Treatments   Labs (all labs ordered are listed, but only abnormal results are displayed) Labs Reviewed  BASIC METABOLIC PANEL  CBC WITH DIFFERENTIAL/PLATELET    EKG None  Radiology No results found.  Procedures Procedures  {Document cardiac monitor, telemetry assessment procedure when appropriate:1}  Medications Ordered in ED Medications  sodium chloride 0.9 % bolus 1,000 mL (has no administration in time range)    ED Course/ Medical Decision Making/ A&P   {   Click here for ABCD2, HEART and other calculatorsREFRESH Note before signing :1}  Medical Decision Making Amount and/or Complexity of Data Reviewed Labs: ordered. Radiology: ordered.   ***  {Document critical care time when appropriate:1} {Document review of labs and clinical decision tools ie heart score, Chads2Vasc2 etc:1}  {Document your independent review of radiology images, and any outside records:1} {Document your discussion with family members, caretakers, and with consultants:1} {Document social determinants of health affecting pt's care:1} {Document your decision making why or why not admission, treatments were needed:1} Final Clinical Impression(s) / ED Diagnoses Final diagnoses:  None    Rx / DC Orders ED Discharge Orders     None

## 2023-04-20 NOTE — ED Triage Notes (Signed)
Patient here from home reporting oral swelling from dental procedure.

## 2023-04-21 ENCOUNTER — Emergency Department (HOSPITAL_COMMUNITY): Payer: Medicaid Other

## 2023-04-21 DIAGNOSIS — R609 Edema, unspecified: Secondary | ICD-10-CM | POA: Diagnosis not present

## 2023-04-21 LAB — BASIC METABOLIC PANEL
Anion gap: 6 (ref 5–15)
BUN: 15 mg/dL (ref 6–20)
CO2: 28 mmol/L (ref 22–32)
Calcium: 8.9 mg/dL (ref 8.9–10.3)
Chloride: 104 mmol/L (ref 98–111)
Creatinine, Ser: 0.75 mg/dL (ref 0.61–1.24)
GFR, Estimated: >60 mL/min (ref >60)
Glucose, Bld: 90 mg/dL (ref 70–99)
Potassium: 3.7 mmol/L (ref 3.5–5.1)
Sodium: 138 mmol/L (ref 135–145)

## 2023-04-21 LAB — CBC WITH DIFFERENTIAL/PLATELET
Abs Immature Granulocytes: 0.03 10*3/uL (ref 0.00–0.07)
Basophils Absolute: 0 10*3/uL (ref 0.0–0.1)
Basophils Relative: 0 %
Eosinophils Absolute: 0.1 10*3/uL (ref 0.0–0.5)
Eosinophils Relative: 1 %
HCT: 40 % (ref 39.0–52.0)
Hemoglobin: 13.1 g/dL (ref 13.0–17.0)
Immature Granulocytes: 0 %
Lymphocytes Relative: 35 %
Lymphs Abs: 4.3 10*3/uL — ABNORMAL HIGH (ref 0.7–4.0)
MCH: 30.6 pg (ref 26.0–34.0)
MCHC: 32.8 g/dL (ref 30.0–36.0)
MCV: 93.5 fL (ref 80.0–100.0)
Monocytes Absolute: 1 10*3/uL (ref 0.1–1.0)
Monocytes Relative: 8 %
Neutro Abs: 6.8 10*3/uL (ref 1.7–7.7)
Neutrophils Relative %: 56 %
Platelets: 172 10*3/uL (ref 150–400)
RBC: 4.28 MIL/uL (ref 4.22–5.81)
RDW: 13.8 % (ref 11.5–15.5)
WBC: 12.2 10*3/uL — ABNORMAL HIGH (ref 4.0–10.5)
nRBC: 0 % (ref 0.0–0.2)

## 2023-04-21 MED ORDER — DEXAMETHASONE SODIUM PHOSPHATE 10 MG/ML IJ SOLN
10.0000 mg | Freq: Once | INTRAMUSCULAR | Status: AC
  Administered 2023-04-21: 10 mg via INTRAVENOUS
  Filled 2023-04-21: qty 1

## 2023-04-21 MED ORDER — IOHEXOL 300 MG/ML  SOLN
75.0000 mL | Freq: Once | INTRAMUSCULAR | Status: AC | PRN
  Administered 2023-04-21: 75 mL via INTRAVENOUS

## 2023-04-21 MED ORDER — OXYCODONE-ACETAMINOPHEN 5-325 MG PO TABS
1.0000 | ORAL_TABLET | Freq: Once | ORAL | Status: AC
  Administered 2023-04-21: 1 via ORAL
  Filled 2023-04-21: qty 1

## 2023-04-21 MED ORDER — CLINDAMYCIN HCL 300 MG PO CAPS: 300.0000 mg | ORAL_CAPSULE | Freq: Three times a day (TID) | ORAL | 0 refills | Status: AC

## 2023-04-21 MED ORDER — CLINDAMYCIN PHOSPHATE 900 MG/50ML IV SOLN
900.0000 mg | Freq: Once | INTRAVENOUS | Status: AC
  Administered 2023-04-21: 900 mg via INTRAVENOUS
  Filled 2023-04-21: qty 50

## 2023-04-21 NOTE — Discharge Instructions (Addendum)
Take antibiotics as prescribed, discontinue the amoxicillin previously prescribed by her oral surgeon.  It is important to stay hydrated to help reduce swelling. Recheck with your oral surgeon on Monday.  Return to the ER for worsening or concerning symptoms.

## 2023-04-27 ENCOUNTER — Telehealth: Payer: Self-pay | Admitting: Neurology

## 2023-04-27 MED ORDER — DIVALPROEX SODIUM 500 MG PO DR TAB: 500.0000 mg | DELAYED_RELEASE_TABLET | Freq: Two times a day (BID) | ORAL | 6 refills | Status: DC

## 2023-04-27 NOTE — Telephone Encounter (Signed)
Pt called. Stated he can't get refill on seizure medication because pharmacy is telling him they don't have refills on file. Pt states he needs refill on  divalproex (DEPAKOTE) 500 MG DR tablet. Refills should be sent to [id number] - Pt asked if on call provider can please send in refill

## 2023-04-27 NOTE — Telephone Encounter (Signed)
Meds ordered this encounter  Medications   divalproex (DEPAKOTE) 500 MG DR tablet    Sig: Take 1 tablet (500 mg total) by mouth 2 (two) times daily.    Dispense:  60 tablet    Refill:  6

## 2023-04-30 NOTE — Telephone Encounter (Signed)
divalproex (DEPAKOTE) 500 MG DR tablet 500 mg, 2 times daily 6 ordered       Summary: Take 1 tablet (500 mg total) by mouth 2 (two) times daily., Starting Fri 04/27/2023, Until Fri 11/23/2023, Normal Dose, Route, Frequency: 500 mg, Oral, 2 times dailyStart: 06/28/2024End: 01/24/2025Ord/Sold: 04/27/2023 (O)Ordered On: 06/28/2024[id number] [place] [place] - [address]: Dx Associated: Taking: Long-term: Med Note:       Select patient know, Depakote DR 500 mg twice a day was called to his pharmacy on June 28

## 2023-05-01 ENCOUNTER — Ambulatory Visit: Payer: Medicaid Other | Admitting: Neurology

## 2023-05-08 ENCOUNTER — Ambulatory Visit: Payer: Medicaid Other | Admitting: Neurology

## 2023-05-08 ENCOUNTER — Telehealth: Payer: Self-pay | Admitting: Neurology

## 2023-05-08 ENCOUNTER — Other Ambulatory Visit: Payer: Self-pay

## 2023-05-08 MED ORDER — DIVALPROEX SODIUM 500 MG PO DR TAB: 1000.0000 mg | DELAYED_RELEASE_TABLET | Freq: Two times a day (BID) | ORAL | 3 refills | Status: DC

## 2023-05-08 NOTE — Telephone Encounter (Signed)
  Per last note, Camara wanted dosing increased to 2 tabs twice daily, I called patient to verify how much he was taking and he is taking as irected but he needed a new prescription sent with updated dose for pharmacy to fill, I sent rx with updated dosing and called pharmacy to make sure they received it and they said they had. I called and relayed this to the patient who voice understanding and gratitude for the help. Pt had no questions at this time but was encouraged to call back if questions arise.

## 2023-05-08 NOTE — Telephone Encounter (Signed)
Pt called needing to discuss how he takes his divalproex (DEPAKOTE) 500 MG DR tablet He states that his insurance will not cover the medication the way he is taking it. Please advise.

## 2023-06-20 ENCOUNTER — Encounter: Payer: Self-pay | Admitting: Neurology

## 2023-06-20 ENCOUNTER — Telehealth (HOSPITAL_COMMUNITY): Payer: Self-pay | Admitting: Student in an Organized Health Care Education/Training Program

## 2023-07-20 ENCOUNTER — Encounter (HOSPITAL_COMMUNITY): Payer: Self-pay | Admitting: Student in an Organized Health Care Education/Training Program

## 2023-07-20 ENCOUNTER — Ambulatory Visit (INDEPENDENT_AMBULATORY_CARE_PROVIDER_SITE_OTHER): Payer: Medicaid Other | Admitting: Student in an Organized Health Care Education/Training Program

## 2023-07-20 DIAGNOSIS — G4700 Insomnia, unspecified: Secondary | ICD-10-CM | POA: Diagnosis not present

## 2023-07-20 DIAGNOSIS — F321 Major depressive disorder, single episode, moderate: Secondary | ICD-10-CM

## 2023-07-20 DIAGNOSIS — F411 Generalized anxiety disorder: Secondary | ICD-10-CM

## 2023-07-20 MED ORDER — QUETIAPINE FUMARATE 50 MG PO TABS
50.0000 mg | ORAL_TABLET | Freq: Every day | ORAL | 0 refills | Status: DC
Start: 2023-07-20 — End: 2023-09-07

## 2023-07-20 MED ORDER — SERTRALINE HCL 50 MG PO TABS
50.0000 mg | ORAL_TABLET | Freq: Every day | ORAL | 1 refills | Status: DC
Start: 2023-07-20 — End: 2023-09-07

## 2023-07-20 NOTE — Progress Notes (Signed)
BH MD/PA/NP OP Progress Note  07/20/2023 8:54 AM Maxwell Faulkner  MRN:  [id number]  Chief Complaint:  Chief Complaint  Patient presents with   Follow-up   Depression   Anxiety   HPI:  Maxwell Faulkner is a 37 yr old male who presents for Follow Up and Medication Management.  PPHx is significant for Depression and Anxiety, and no Suicide Attempts, Self Injurious Behavior, or Hospitalizations.   He reports he has not been doing well since our last appointment.  He reports that he began having seizures frequently.  He reports that he has been started on Depakote by neurology and is continuing to be followed by them.  He reports that due to this he has been unable to work which has meant he had to move back in with his mother.  He also reports that he is unable to help pay bills or provide food which is another stressor on him.  He reports that he is pursuing disability.  He requests a note stating that he is been under psychiatric care and discussed with him that this could be provided.  Discussed with him that given his increased depression and anxiety we would recommend increasing his Zoloft and he was agreeable with this.  He reports no side effects to his psychiatric medications.  He reports no SI, HI, or AVH.  He reports his sleep is good.  He reports appetite is good.  He reports no other concerns at present.  He return follow-up approximately 6 to 8 weeks.   Visit Diagnosis:    ICD-10-CM   1. GAD (generalized anxiety disorder)  F41.1 sertraline (ZOLOFT) 50 MG tablet    QUEtiapine (SEROQUEL) 50 MG tablet    2. Insomnia, unspecified type  G47.00 sertraline (ZOLOFT) 50 MG tablet    QUEtiapine (SEROQUEL) 50 MG tablet    3. Current moderate episode of major depressive disorder, unspecified whether recurrent (HCC)  F32.1 sertraline (ZOLOFT) 50 MG tablet    QUEtiapine (SEROQUEL) 50 MG tablet       Past Psychiatric History: Depression and Anxiety, and no Suicide Attempts, Self Injurious  Behavior, or Hospitalizations.  Past Medical History:  Past Medical History:  Diagnosis Date   Anxiety    Depression    Hypertension     Past Surgical History:  Procedure Laterality Date   WISDOM TOOTH EXTRACTION      Family Psychiatric History: No Known Diagnosis', Substance Abuse, or Suicides.   Family History:  Family History  Problem Relation Age of Onset   Hypertension Mother     Social History:  Social History   Socioeconomic History   Marital status: Significant Other    Spouse name: sierra whitsett   Number of children: 3   Years of education: Not on file   Highest education level: Not on file  Occupational History   Not on file  Tobacco Use   Smoking status: Every Day    Current packs/day: 1.00    Types: Cigarettes   Smokeless tobacco: Never  Vaping Use   Vaping status: Never Used  Substance and Sexual Activity   Alcohol use: No    Comment: stopped drinking   Drug use: Yes    Types: Marijuana   Sexual activity: Yes    Birth control/protection: None  Other Topics Concern   Not on file  Social History Narrative   Not on file   Social Determinants of Health   Financial Resource Strain: Not on file  Food Insecurity: Not  on file  Transportation Needs: No Transportation Needs (01/05/2023)   PRAPARE - Administrator, Civil Service (Medical): No    Lack of Transportation (Non-Medical): No  Physical Activity: Not on file  Stress: Not on file  Social Connections: Not on file    Allergies: No Known Allergies  Metabolic Disorder Labs: No results found for: "HGBA1C", "MPG" No results found for: "PROLACTIN" Lab Results  Component Value Date   CHOL 126 06/02/2022   TRIG 60 06/02/2022   HDL 44 06/02/2022   CHOLHDL 2.9 06/02/2022   LDLCALC 69 06/02/2022   No results found for: "TSH"  Therapeutic Level Labs: No results found for: "LITHIUM" No results found for: "VALPROATE" No results found for: "CBMZ"  Current Medications: Current  Outpatient Medications  Medication Sig Dispense Refill   cyclobenzaprine (FLEXERIL) 10 MG tablet Take 1 tablet (10 mg total) by mouth 2 (two) times daily as needed for muscle spasms. 15 tablet 0   diazePAM, 15 MG Dose, (VALTOCO 15 MG DOSE) 2 x 7.5 MG/0.1ML LQPK Place 15 mg into the nose as needed (For seizure lasting more than 5 minutes). 5 each 5   divalproex (DEPAKOTE) 500 MG DR tablet Take 2 tablets (1,000 mg total) by mouth 2 (two) times daily. 120 tablet 3   QUEtiapine (SEROQUEL) 50 MG tablet Take 1 tablet (50 mg total) by mouth at bedtime. 90 tablet 0   sertraline (ZOLOFT) 50 MG tablet Take 1 tablet (50 mg total) by mouth daily. 30 tablet 1   No current facility-administered medications for this visit.     Musculoskeletal: Strength & Muscle Tone: within normal limits Gait & Station: normal Patient leans: N/A  Psychiatric Specialty Exam: Review of Systems  Respiratory:  Negative for cough and shortness of breath.   Cardiovascular:  Negative for chest pain.  Gastrointestinal:  Negative for abdominal pain, constipation, diarrhea, nausea and vomiting.  Neurological:  Negative for dizziness, weakness and headaches.  Psychiatric/Behavioral:  Positive for dysphoric mood. Negative for hallucinations, sleep disturbance and suicidal ideas. The patient is nervous/anxious.     Blood pressure (!) 138/94, pulse 87, height 5\' 6"  (1.676 m), weight 110 lb 9.6 oz (50.2 kg), SpO2 100%.Body mass index is 17.85 kg/m.  General Appearance: Casual and Fairly Groomed  Eye Contact:  Good  Speech:  Clear and Coherent and Normal Rate  Volume:  Normal  Mood:  Anxious and Depressed  Affect:  Congruent and Depressed  Thought Process:  Coherent and Goal Directed  Orientation:  Full (Time, Place, and Person)  Thought Content: WDL and Logical   Suicidal Thoughts:  No  Homicidal Thoughts:  No  Memory:  Immediate;   Good Recent;   Good  Judgement:  Good  Insight:  Good  Psychomotor Activity:  Normal   Concentration:  Concentration: Good and Attention Span: Good  Recall:  Good  Fund of Knowledge: Good  Language: Good  Akathisia:  Negative  Handed:  Right  AIMS (if indicated): not done  Assets:  Communication Skills Desire for Improvement Housing Physical Health Resilience Social Support  ADL's:  Intact  Cognition: WNL  Sleep:  Good   Screenings: GAD-7    Flowsheet Row Office Visit from 06/02/2022 in [facility]  Total GAD-7 Score 18      PHQ2-9    Flowsheet Row Office Visit from 06/02/2022 in [facility]  PHQ-2 Total Score 6  PHQ-9 Total Score 23      Flowsheet Row ED  from 04/20/20[facility] in [facility] Emergency Department at [facility] ED from 03/14/2023 in [facility] Emergency Department at [facility] ED from 02/27/2023 in [facility] Emergency Department at [facility]  C-SSRS RISK CATEGORY No Risk No Risk No Risk        Assessment and Plan:  Maxwell Faulkner is a 37 yr old male who presents for Follow Up and Medication Management.  PPHx is significant for Depression and Anxiety, and no Suicide Attempts, Self Injurious Behavior, or Hospitalizations.   Devean has developed seizures since our last appointment.  He has been started on Depakote by neurology and is continuing to be followed by them.  Due to the incapacitation from this it is exacerbated his depression and anxiety.  We will increase his Zoloft at this time.  We will not make any other changes to his medication at this time.  He will return for follow-up in approximately 6 to 8 weeks.   MDD, Recurrant, Moderate  GAD: -Increase Zoloft to 50 mg daily.  30 tablets with 1 refill. -Continue Seroquel 50 mg QHS for Augmentation, sleep, and appetite.  90 tablets with 0 refills.     Insomnia: -Continue Seroquel 50 mg QHS for Augmentation, sleep, and appetite.  90 tablets with 0 refills.   Seizures: -Depakote 1000 mg BID  prescribed by Neurology   Collaboration of Care:   Patient/Guardian was advised Release of Information must be obtained prior to any record release in order to collaborate their care with an outside provider. Patient/Guardian was advised if they have not already done so to contact the registration department to sign all necessary forms in order for [place] to release information regarding their care.   Consent: Patient/Guardian gives verbal consent for treatment and assignment of benefits for services provided during this visit. Patient/Guardian expressed understanding and agreed to proceed.    Lauro Franklin, MD 07/20/2023, 8:54 AM

## 2023-07-21 ENCOUNTER — Telehealth: Payer: Self-pay | Admitting: Neurology

## 2023-07-21 DIAGNOSIS — G40909 Epilepsy, unspecified, not intractable, without status epilepticus: Secondary | ICD-10-CM

## 2023-07-21 NOTE — Telephone Encounter (Signed)
I returned call from pt to answering service and spoke to him. He has had 5 seizures in last 1 week and last one this am but is now back to baseline. He is om depakote 1 gm twice daily. I asked him to extra 500 mg today and come to office for checking level on Monday. If he has any more seizures go to ER. He voiced understanding

## 2023-07-23 ENCOUNTER — Other Ambulatory Visit (INDEPENDENT_AMBULATORY_CARE_PROVIDER_SITE_OTHER): Payer: Self-pay

## 2023-07-23 DIAGNOSIS — Z0289 Encounter for other administrative examinations: Secondary | ICD-10-CM

## 2023-07-23 DIAGNOSIS — G40909 Epilepsy, unspecified, not intractable, without status epilepticus: Secondary | ICD-10-CM | POA: Diagnosis not present

## 2023-07-23 NOTE — Telephone Encounter (Signed)
Pt calling to speak with nurse.  On call neurologist instructed to come in today and check levels.What time do I need to come. Pt said, seizure had on Saturday medication could not bring pt out of it, this seizure last longer, could come out of it.

## 2023-07-23 NOTE — Addendum Note (Signed)
Addended byWindell Norfolk on: 07/23/2023 11:55 AM   Modules accepted: Orders

## 2023-07-23 NOTE — Addendum Note (Signed)
Addended by: Lenn Cal on: 07/23/2023 11:38 AM   Modules accepted: Orders

## 2023-07-23 NOTE — Telephone Encounter (Signed)
Call to patient, he states that he had multiple seizures last week and spoke to Dr. Pearlean Brownie. He refused to go to ER and Dr. Pearlean Brownie had advised him to take an extra 500 mg depakote Saturday but he didn't take due to not remembering instructions.he states his girlfroned did use the valtoco but didn't help as much, advised they could use at onset of seizure and he verbalized understanding.  I asked if his girlfriend was there that I could speak to but she is out running an errand. He states that he had missed a medication dose but unsure of how many doses. We discussed seizure triggers ( video games, loud music. Sleep depravation, dehydration, high stress environments, drug and alcohol use). He reports no alcohol but does smoke marijuana. We also discussed setting an alarm and using a pill box to help reduce risk of missing medication  Advised that I would let Dr. Teresa Coombs know and add the order for lab work to check depakote.  Patient verbalized understanding and I advised that if  another seizure or recurrent to call 911.

## 2023-07-24 LAB — VALPROIC ACID LEVEL: Valproic Acid Lvl: 116 ug/mL — ABNORMAL HIGH (ref 50–100)

## 2023-07-25 ENCOUNTER — Other Ambulatory Visit: Payer: Self-pay | Admitting: Neurology

## 2023-07-25 ENCOUNTER — Telehealth (HOSPITAL_COMMUNITY): Payer: Self-pay | Admitting: Student in an Organized Health Care Education/Training Program

## 2023-07-25 MED ORDER — CYCLOBENZAPRINE HCL 10 MG PO TABS: 10.0000 mg | ORAL_TABLET | Freq: Two times a day (BID) | ORAL | 0 refills | Status: DC | PRN

## 2023-07-26 NOTE — Telephone Encounter (Signed)
Attempted to call patient back, [phone], at 7:34 AM 9/26.  However, there was no answer.  Will attempt again later.     Arna Snipe MD Resident

## 2023-07-27 NOTE — Telephone Encounter (Signed)
Called patient back.  He reports that letter provided to him needed additional information.  Discussed that a letter with this additional information would be at the front desk waiting for him on Monday 9/30.  He reported understanding and was thankful.  He reports no other concerns at present.    Arna Snipe MD Resident

## 2023-08-01 ENCOUNTER — Encounter: Payer: Self-pay | Admitting: Neurology

## 2023-08-01 ENCOUNTER — Ambulatory Visit: Payer: Medicaid Other | Admitting: Neurology

## 2023-08-01 VITALS — BP 128/76 | Ht 66.0 in | Wt 111.0 lb

## 2023-08-01 DIAGNOSIS — Z5181 Encounter for therapeutic drug level monitoring: Secondary | ICD-10-CM | POA: Diagnosis not present

## 2023-08-01 DIAGNOSIS — G40909 Epilepsy, unspecified, not intractable, without status epilepticus: Secondary | ICD-10-CM

## 2023-08-01 MED ORDER — DIVALPROEX SODIUM 500 MG PO DR TAB: 1000.0000 mg | DELAYED_RELEASE_TABLET | Freq: Two times a day (BID) | ORAL | 6 refills | Status: DC
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
## 2023-08-01 NOTE — Patient Instructions (Signed)
Continue with Depakote 1000 mg twice daily  Will check Depakote level with CMP  Continue to follow up with mental health  Letter of support given to patient  Return in 6 months or sooner if worse

## 2023-08-02 LAB — COMPREHENSIVE METABOLIC PANEL
ALT: 22 [IU]/L (ref 0–44)
AST: 28 [IU]/L (ref 0–40)
Albumin: 4 g/dL — ABNORMAL LOW (ref 4.1–5.1)
Alkaline Phosphatase: 68 [IU]/L (ref 44–121)
BUN/Creatinine Ratio: 16 (ref 9–20)
BUN: 14 mg/dL (ref 6–20)
Bilirubin Total: 0.3 mg/dL (ref 0.0–1.2)
CO2: 24 mmol/L (ref 20–29)
Calcium: 9.5 mg/dL (ref 8.7–10.2)
Chloride: 102 mmol/L (ref 96–106)
Creatinine, Ser: 0.85 mg/dL (ref 0.76–1.27)
Globulin, Total: 2.7 g/dL (ref 1.5–4.5)
Glucose: 111 mg/dL — ABNORMAL HIGH (ref 70–99)
Potassium: 4.9 mmol/L (ref 3.5–5.2)
Sodium: 140 mmol/L (ref 134–144)
Total Protein: 6.7 g/dL (ref 6.0–8.5)
eGFR: 114 mL/min/{1.73_m2} (ref >59)

## 2023-08-02 LAB — VALPROIC ACID LEVEL: Valproic Acid Lvl: 84 ug/mL (ref 50–100)

## 2023-08-23 ENCOUNTER — Other Ambulatory Visit: Payer: Self-pay

## 2023-08-23 ENCOUNTER — Emergency Department (HOSPITAL_COMMUNITY)
Admission: EM | Admit: 2023-08-23 | Discharge: 2023-08-23 | Disposition: A | Discharge status: Home / Self Care | Payer: No Typology Code available for payment source | Attending: Emergency Medicine | Admitting: Emergency Medicine

## 2023-08-23 ENCOUNTER — Encounter (HOSPITAL_COMMUNITY): Payer: Self-pay

## 2023-08-23 ENCOUNTER — Emergency Department (HOSPITAL_COMMUNITY): Payer: No Typology Code available for payment source

## 2023-08-23 DIAGNOSIS — S299XXA Unspecified injury of thorax, initial encounter: Secondary | ICD-10-CM | POA: Diagnosis not present

## 2023-08-23 DIAGNOSIS — M79629 Pain in unspecified upper arm: Secondary | ICD-10-CM | POA: Diagnosis not present

## 2023-08-23 DIAGNOSIS — R102 Pelvic and perineal pain: Secondary | ICD-10-CM | POA: Insufficient documentation

## 2023-08-23 DIAGNOSIS — M545 Low back pain, unspecified: Secondary | ICD-10-CM | POA: Insufficient documentation

## 2023-08-23 DIAGNOSIS — S3991XA Unspecified injury of abdomen, initial encounter: Secondary | ICD-10-CM | POA: Diagnosis not present

## 2023-08-23 DIAGNOSIS — H0530 Unspecified deformity of orbit: Secondary | ICD-10-CM | POA: Diagnosis not present

## 2023-08-23 DIAGNOSIS — Y92410 Unspecified street and highway as the place of occurrence of the external cause: Secondary | ICD-10-CM | POA: Diagnosis not present

## 2023-08-23 DIAGNOSIS — Z23 Encounter for immunization: Secondary | ICD-10-CM | POA: Diagnosis not present

## 2023-08-23 DIAGNOSIS — M79662 Pain in left lower leg: Secondary | ICD-10-CM | POA: Diagnosis not present

## 2023-08-23 DIAGNOSIS — S199XXA Unspecified injury of neck, initial encounter: Secondary | ICD-10-CM | POA: Diagnosis not present

## 2023-08-23 DIAGNOSIS — M542 Cervicalgia: Secondary | ICD-10-CM | POA: Diagnosis not present

## 2023-08-23 DIAGNOSIS — S0990XA Unspecified injury of head, initial encounter: Secondary | ICD-10-CM | POA: Diagnosis not present

## 2023-08-23 DIAGNOSIS — M79661 Pain in right lower leg: Secondary | ICD-10-CM | POA: Diagnosis not present

## 2023-08-23 DIAGNOSIS — S3993XA Unspecified injury of pelvis, initial encounter: Secondary | ICD-10-CM | POA: Diagnosis not present

## 2023-08-23 DIAGNOSIS — J439 Emphysema, unspecified: Secondary | ICD-10-CM | POA: Diagnosis not present

## 2023-08-23 LAB — ETHANOL: Alcohol, Ethyl (B): <10 mg/dL (ref <10)

## 2023-08-23 LAB — COMPREHENSIVE METABOLIC PANEL
ALT: 23 U/L (ref 0–44)
AST: 31 U/L (ref 15–41)
Albumin: 3.2 g/dL — ABNORMAL LOW (ref 3.5–5.0)
Alkaline Phosphatase: 41 U/L (ref 38–126)
Anion gap: 7 (ref 5–15)
BUN: 9 mg/dL (ref 6–20)
CO2: 26 mmol/L (ref 22–32)
Calcium: 8.7 mg/dL — ABNORMAL LOW (ref 8.9–10.3)
Chloride: 104 mmol/L (ref 98–111)
Creatinine, Ser: 1.01 mg/dL (ref 0.61–1.24)
GFR, Estimated: >60 mL/min (ref >60)
Glucose, Bld: 92 mg/dL (ref 70–99)
Potassium: 3.7 mmol/L (ref 3.5–5.1)
Sodium: 137 mmol/L (ref 135–145)
Total Bilirubin: 0.5 mg/dL (ref 0.3–1.2)
Total Protein: 5.9 g/dL — ABNORMAL LOW (ref 6.5–8.1)

## 2023-08-23 LAB — CBC
HCT: 35.2 % — ABNORMAL LOW (ref 39.0–52.0)
Hemoglobin: 11.6 g/dL — ABNORMAL LOW (ref 13.0–17.0)
MCH: 32.7 pg (ref 26.0–34.0)
MCHC: 33 g/dL (ref 30.0–36.0)
MCV: 99.2 fL (ref 80.0–100.0)
Platelets: 159 10*3/uL (ref 150–400)
RBC: 3.55 MIL/uL — ABNORMAL LOW (ref 4.22–5.81)
RDW: 13.5 % (ref 11.5–15.5)
WBC: 6.2 10*3/uL (ref 4.0–10.5)
nRBC: 0 % (ref 0.0–0.2)

## 2023-08-23 LAB — I-STAT CHEM 8, ED
BUN: 9 mg/dL (ref 6–20)
Calcium, Ion: 1.2 mmol/L (ref 1.15–1.40)
Chloride: 103 mmol/L (ref 98–111)
Creatinine, Ser: 1 mg/dL (ref 0.61–1.24)
Glucose, Bld: 88 mg/dL (ref 70–99)
HCT: 36 % — ABNORMAL LOW (ref 39.0–52.0)
Hemoglobin: 12.2 g/dL — ABNORMAL LOW (ref 13.0–17.0)
Potassium: 3.8 mmol/L (ref 3.5–5.1)
Sodium: 141 mmol/L (ref 135–145)
TCO2: 26 mmol/L (ref 22–32)

## 2023-08-23 LAB — I-STAT CG4 LACTIC ACID, ED: Lactic Acid, Venous: 1.2 mmol/L (ref 0.5–1.9)

## 2023-08-23 LAB — SAMPLE TO BLOOD BANK

## 2023-08-23 LAB — PROTIME-INR
INR: 1 (ref 0.8–1.2)
Prothrombin Time: 13.8 s (ref 11.4–15.2)

## 2023-08-23 MED ORDER — DIVALPROEX SODIUM 250 MG PO DR TAB
1000.0000 mg | DELAYED_RELEASE_TABLET | Freq: Two times a day (BID) | ORAL | Status: DC
  Administered 2023-08-23: 1000 mg via ORAL
  Filled 2023-08-23: qty 4

## 2023-08-23 MED ORDER — FENTANYL CITRATE PF 50 MCG/ML IJ SOSY
50.0000 ug | PREFILLED_SYRINGE | Freq: Once | INTRAMUSCULAR | Status: AC
  Administered 2023-08-23: 50 ug via INTRAVENOUS
  Filled 2023-08-23: qty 1

## 2023-08-23 MED ORDER — ONDANSETRON HCL 4 MG/2ML IJ SOLN
4.0000 mg | Freq: Once | INTRAMUSCULAR | Status: AC
  Administered 2023-08-23: 4 mg via INTRAVENOUS
  Filled 2023-08-23: qty 2

## 2023-08-23 MED ORDER — TETANUS-DIPHTH-ACELL PERTUSSIS 5-2.5-18.5 LF-MCG/0.5 IM SUSY
0.5000 mL | PREFILLED_SYRINGE | Freq: Once | INTRAMUSCULAR | Status: AC
  Administered 2023-08-23: 0.5 mL via INTRAMUSCULAR
  Filled 2023-08-23: qty 0.5

## 2023-08-23 MED ORDER — IOHEXOL 350 MG/ML SOLN
75.0000 mL | Freq: Once | INTRAVENOUS | Status: AC | PRN
Start: 2023-08-23 — End: 2023-08-23
  Administered 2023-08-23: 75 mL via INTRAVENOUS

## 2023-08-23 MED ORDER — CELECOXIB 200 MG PO CAPS: 200.0000 mg | ORAL_CAPSULE | Freq: Two times a day (BID) | ORAL | 0 refills | Status: DC

## 2023-08-23 MED ORDER — METHOCARBAMOL 500 MG PO TABS: 500.0000 mg | ORAL_TABLET | Freq: Three times a day (TID) | ORAL | 0 refills | Status: DC | PRN

## 2023-08-23 MED ORDER — LEVETIRACETAM 500 MG PO TABS: 1000.0000 mg | ORAL_TABLET | Freq: Once | ORAL | Status: DC

## 2023-08-23 NOTE — Discharge Instructions (Signed)
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
## 2023-09-07 ENCOUNTER — Encounter (HOSPITAL_COMMUNITY): Payer: Self-pay | Admitting: Student in an Organized Health Care Education/Training Program

## 2023-09-07 ENCOUNTER — Ambulatory Visit (INDEPENDENT_AMBULATORY_CARE_PROVIDER_SITE_OTHER): Payer: Medicaid Other | Admitting: Student in an Organized Health Care Education/Training Program

## 2023-09-07 DIAGNOSIS — F411 Generalized anxiety disorder: Secondary | ICD-10-CM

## 2023-09-07 DIAGNOSIS — F321 Major depressive disorder, single episode, moderate: Secondary | ICD-10-CM | POA: Diagnosis not present

## 2023-09-07 DIAGNOSIS — G4700 Insomnia, unspecified: Secondary | ICD-10-CM | POA: Diagnosis not present

## 2023-09-07 MED ORDER — SERTRALINE HCL 50 MG PO TABS
50.0000 mg | ORAL_TABLET | Freq: Every day | ORAL | 2 refills | Status: DC
Start: 2023-09-07 — End: 2023-12-14

## 2023-09-07 MED ORDER — QUETIAPINE FUMARATE 50 MG PO TABS
50.0000 mg | ORAL_TABLET | Freq: Every day | ORAL | 0 refills | Status: DC
Start: 2023-09-07 — End: 2023-12-14

## 2023-09-07 NOTE — Progress Notes (Signed)
BH MD/PA/NP OP Progress Note  09/07/2023 9:27 AM Maxwell Faulkner  MRN:  [id number]  Chief Complaint:  Chief Complaint  Patient presents with   Follow-up   Depression   Anxiety   HPI:  Maxwell Faulkner. Lipp is a 37 yr old male who presents for Follow Up and Medication Management.  PPHx is significant for Depression and Anxiety, and no Suicide Attempts, Self Injurious Behavior, or Hospitalizations.   He reports continuing to have seizures regularly.  He reports that it does continue to impact his life significantly.  He reports that the increase in Zoloft did help improve his mood.  He reports he is focusing on trying to make good memories with his children.  He reports that for most of his 66 year old son's life he was in and out of prison and so mainly only had visits with him and he is trying to change that.  Encouraged him to continue in these positive efforts.  Discussed with him that since he was doing better we would not make any changes to his medications at this time.  Encouraged him to continue all follow-ups with neurology as he is still having seizures.  He reports no SI, HI, or AVH.  He reports his sleep is good.  He reports appetite is doing good.  He reports no side effects to his medications.  He reports no other concerns at present.  He return for follow-up in approximately 3 months.   Visit Diagnosis:    ICD-10-CM   1. GAD (generalized anxiety disorder)  F41.1 QUEtiapine (SEROQUEL) 50 MG tablet    sertraline (ZOLOFT) 50 MG tablet    2. Insomnia, unspecified type  G47.00 QUEtiapine (SEROQUEL) 50 MG tablet    sertraline (ZOLOFT) 50 MG tablet    3. Current moderate episode of major depressive disorder, unspecified whether recurrent (HCC)  F32.1 QUEtiapine (SEROQUEL) 50 MG tablet    sertraline (ZOLOFT) 50 MG tablet        Past Psychiatric History: Depression and Anxiety, and no Suicide Attempts, Self Injurious Behavior, or Hospitalizations.  Past Medical History:  Past Medical  History:  Diagnosis Date   Anxiety    Depression    Hypertension     Past Surgical History:  Procedure Laterality Date   WISDOM TOOTH EXTRACTION      Family Psychiatric History: No Known Diagnosis', Substance Abuse, or Suicides.   Family History:  Family History  Problem Relation Age of Onset   Hypertension Mother     Social History:  Social History   Socioeconomic History   Marital status: Significant Other    Spouse name: Maxwell Faulkner   Number of children: 3   Years of education: Not on file   Highest education level: Not on file  Occupational History   Not on file  Tobacco Use   Smoking status: Every Day    Current packs/day: 1.00    Types: Cigarettes   Smokeless tobacco: Never  Vaping Use   Vaping status: Never Used  Substance and Sexual Activity   Alcohol use: No    Comment: stopped drinking   Drug use: Yes    Types: Marijuana   Sexual activity: Yes    Birth control/protection: None  Other Topics Concern   Not on file  Social History Narrative   Right handed   Caffeine 1 cup daily   Social Determinants of Health   Financial Resource Strain: Not on file  Food Insecurity: Not on file  Transportation Needs: No Transportation Needs (  01/05/2023)   PRAPARE - Administrator, Civil Service (Medical): No    Lack of Transportation (Non-Medical): No  Physical Activity: Not on file  Stress: Not on file  Social Connections: Not on file    Allergies: No Known Allergies  Metabolic Disorder Labs: No results found for: "HGBA1C", "MPG" No results found for: "PROLACTIN" Lab Results  Component Value Date   CHOL 126 06/02/2022   TRIG 60 06/02/2022   HDL 44 06/02/2022   CHOLHDL 2.9 06/02/2022   LDLCALC 69 06/02/2022   No results found for: "TSH"  Therapeutic Level Labs: No results found for: "LITHIUM" Lab Results  Component Value Date   VALPROATE 84 08/01/2023   VALPROATE 116 (H) 07/23/2023   No results found for: "CBMZ"  Current  Medications: Current Outpatient Medications  Medication Sig Dispense Refill   celecoxib (CELEBREX) 200 MG capsule Take 1 capsule (200 mg total) by mouth 2 (two) times daily. 20 capsule 0   cyclobenzaprine (FLEXERIL) 10 MG tablet Take 1 tablet (10 mg total) by mouth 2 (two) times daily as needed for muscle spasms. 15 tablet 0   diazePAM, 15 MG Dose, (VALTOCO 15 MG DOSE) 2 x 7.5 MG/0.1ML LQPK Place 15 mg into the nose as needed (For seizure lasting more than 5 minutes). 5 each 5   divalproex (DEPAKOTE) 500 MG DR tablet Take 2 tablets (1,000 mg total) by mouth 2 (two) times daily. 240 tablet 6   methocarbamol (ROBAXIN) 500 MG tablet Take 1 tablet (500 mg total) by mouth 3 (three) times daily as needed for muscle spasms. 21 tablet 0   QUEtiapine (SEROQUEL) 50 MG tablet Take 1 tablet (50 mg total) by mouth at bedtime. 90 tablet 0   sertraline (ZOLOFT) 50 MG tablet Take 1 tablet (50 mg total) by mouth daily. 30 tablet 2   No current facility-administered medications for this visit.     Musculoskeletal: Strength & Muscle Tone: within normal limits Gait & Station: normal Patient leans: N/A  Psychiatric Specialty Exam: Review of Systems  Respiratory:  Negative for cough and shortness of breath.   Cardiovascular:  Negative for chest pain.  Gastrointestinal:  Negative for abdominal pain, constipation, diarrhea, nausea and vomiting.  Neurological:  Negative for weakness and headaches.  Psychiatric/Behavioral:  Positive for dysphoric mood (improved). Negative for hallucinations and suicidal ideas. The patient is not nervous/anxious.     Blood pressure 132/86, pulse 67, height 5\' 6"  (1.676 m), weight 111 lb 6.4 oz (50.5 kg), SpO2 100%.Body mass index is 17.98 kg/m.  General Appearance: Casual and Fairly Groomed  Eye Contact:  Good  Speech:  Clear and Coherent and Normal Rate  Volume:  Normal  Mood:  Dysphoric  Affect:  Congruent  Thought Process:  Coherent and Goal Directed  Orientation:  Full  (Time, Place, and Person)  Thought Content: WDL and Logical   Suicidal Thoughts:  No  Homicidal Thoughts:  No  Memory:  Immediate;   Good Recent;   Good  Judgement:  Good  Insight:  Good  Psychomotor Activity:  Normal  Concentration:  Concentration: Good and Attention Span: Good  Recall:  Good  Fund of Knowledge: Good  Language: Good  Akathisia:  Negative  Handed:  Right  AIMS (if indicated): not done  Assets:  Communication Skills Desire for Improvement Housing Physical Health Resilience Social Support  ADL's:  Intact  Cognition: WNL  Sleep:  Good   Screenings: GAD-7    Flowsheet Row [facility] Visit from [facility]/02/2022 in  [facility]  Total GAD-7 Score 18      PHQ2-9    Flowsheet Row Office Visit from 06/02/2022 in [facility]  PHQ-2 Total Score 6  PHQ-9 Total Score 23      Flowsheet Row ED from [facility]/23/2023 in [facility] Department at [facility] ED from 04/20/2023 in [facility] Emergency Department at [facility] ED from 03/14/2023 in [facility] Emergency Department at [facility]  C-SSRS RISK CATEGORY No Risk No Risk No Risk        Assessment and Plan:  Molly Maduro L. Slover is a 37 yr old male who presents for Follow Up and Medication Management.  PPHx is significant for Depression and Anxiety, and no Suicide Attempts, Self Injurious Behavior, or Hospitalizations.   Briden continues to be severely impacted by his seizures.  He did respond well to the increase in Zoloft and is using positive coping skills to improve his mood.  We will not make any changes to his medications at this time.  Refills were sent in.  He will return for follow-up in approximately 3 months.   MDD, Recurrant, Moderate  GAD: -Continue Zoloft to 50 mg daily.  30 tablets with 2 refills. -Continue Seroquel 50 mg QHS for Augmentation, sleep, and appetite.  90 tablets with 0 refills.     Insomnia: -Continue  Seroquel 50 mg QHS for Augmentation, sleep, and appetite.  90 tablets with 0 refills.   Seizures: -Depakote 1000 mg BID prescribed by Neurology   Collaboration of Care:   Patient/Guardian was advised Release of Information must be obtained prior to any record release in order to collaborate their care with an outside provider. Patient/Guardian was advised if they have not already done so to contact the registration department to sign all necessary forms in order for [place] to release information regarding their care.   Consent: Patient/Guardian gives verbal consent for treatment and assignment of benefits for services provided during this visit. Patient/Guardian expressed understanding and agreed to proceed.    Lauro Franklin, MD 09/07/2023, 9:27 AM

## 2023-09-13 ENCOUNTER — Other Ambulatory Visit: Payer: Self-pay | Admitting: Neurology

## 2023-10-14 ENCOUNTER — Emergency Department (HOSPITAL_COMMUNITY): Payer: Medicaid Other

## 2023-10-14 ENCOUNTER — Emergency Department (HOSPITAL_COMMUNITY)
Admission: EM | Admit: 2023-10-14 | Discharge: 2023-10-15 | Disposition: A | Discharge status: Home / Self Care | Payer: Medicaid Other

## 2023-10-14 ENCOUNTER — Other Ambulatory Visit: Payer: Self-pay

## 2023-10-14 ENCOUNTER — Encounter (HOSPITAL_COMMUNITY): Payer: Self-pay

## 2023-10-14 DIAGNOSIS — K529 Noninfective gastroenteritis and colitis, unspecified: Secondary | ICD-10-CM | POA: Diagnosis not present

## 2023-10-14 DIAGNOSIS — R935 Abnormal findings on diagnostic imaging of other abdominal regions, including retroperitoneum: Secondary | ICD-10-CM | POA: Diagnosis not present

## 2023-10-14 DIAGNOSIS — Z20822 Contact with and (suspected) exposure to covid-19: Secondary | ICD-10-CM | POA: Insufficient documentation

## 2023-10-14 DIAGNOSIS — R933 Abnormal findings on diagnostic imaging of other parts of digestive tract: Secondary | ICD-10-CM | POA: Diagnosis not present

## 2023-10-14 DIAGNOSIS — R1031 Right lower quadrant pain: Secondary | ICD-10-CM | POA: Diagnosis not present

## 2023-10-14 HISTORY — DX: Unspecified convulsions: R56.9

## 2023-10-14 LAB — CBC
HCT: 35.9 % — ABNORMAL LOW (ref 39.0–52.0)
Hemoglobin: 11.9 g/dL — ABNORMAL LOW (ref 13.0–17.0)
MCH: 31.6 pg (ref 26.0–34.0)
MCHC: 33.1 g/dL (ref 30.0–36.0)
MCV: 95.5 fL (ref 80.0–100.0)
Platelets: 188 10*3/uL (ref 150–400)
RBC: 3.76 MIL/uL — ABNORMAL LOW (ref 4.22–5.81)
RDW: 13.3 % (ref 11.5–15.5)
WBC: 6.6 10*3/uL (ref 4.0–10.5)
nRBC: 0 % (ref 0.0–0.2)

## 2023-10-14 LAB — LIPASE, BLOOD: Lipase: 27 U/L (ref 11–51)

## 2023-10-14 LAB — COMPREHENSIVE METABOLIC PANEL
ALT: 19 U/L (ref 0–44)
AST: 21 U/L (ref 15–41)
Albumin: 3 g/dL — ABNORMAL LOW (ref 3.5–5.0)
Alkaline Phosphatase: 46 U/L (ref 38–126)
Anion gap: 10 (ref 5–15)
BUN: 11 mg/dL (ref 6–20)
CO2: 25 mmol/L (ref 22–32)
Calcium: 8.8 mg/dL — ABNORMAL LOW (ref 8.9–10.3)
Chloride: 105 mmol/L (ref 98–111)
Creatinine, Ser: 0.89 mg/dL (ref 0.61–1.24)
GFR, Estimated: >60 mL/min (ref >60)
Glucose, Bld: 159 mg/dL — ABNORMAL HIGH (ref 70–99)
Potassium: 3.8 mmol/L (ref 3.5–5.1)
Sodium: 140 mmol/L (ref 135–145)
Total Bilirubin: 0.6 mg/dL (ref <1.2)
Total Protein: 6.3 g/dL — ABNORMAL LOW (ref 6.5–8.1)

## 2023-10-14 LAB — URINALYSIS, ROUTINE W REFLEX MICROSCOPIC
Bacteria, UA: NONE SEEN
Bilirubin Urine: NEGATIVE
Glucose, UA: NEGATIVE mg/dL
Hgb urine dipstick: NEGATIVE
Ketones, ur: 5 mg/dL — AB
Leukocytes,Ua: NEGATIVE
Nitrite: NEGATIVE
Protein, ur: 30 mg/dL — AB
Specific Gravity, Urine: 1.034 — ABNORMAL HIGH (ref 1.005–1.030)
pH: 5 (ref 5.0–8.0)

## 2023-10-14 LAB — SARS CORONAVIRUS 2 BY RT PCR: SARS Coronavirus 2 by RT PCR: NEGATIVE

## 2023-10-14 MED ORDER — IOHEXOL 350 MG/ML SOLN
75.0000 mL | Freq: Once | INTRAVENOUS | Status: AC | PRN
  Administered 2023-10-14: 75 mL via INTRAVENOUS

## 2023-10-14 MED ORDER — KETOROLAC TROMETHAMINE 15 MG/ML IJ SOLN
15.0000 mg | Freq: Once | INTRAMUSCULAR | Status: AC
  Administered 2023-10-14: 15 mg via INTRAVENOUS
  Filled 2023-10-14: qty 1

## 2023-10-14 MED ORDER — ONDANSETRON HCL 4 MG/2ML IJ SOLN
4.0000 mg | Freq: Once | INTRAMUSCULAR | Status: AC
Start: 2023-10-14 — End: 2023-10-14
  Administered 2023-10-14: 4 mg via INTRAVENOUS
  Filled 2023-10-14: qty 2

## 2023-10-14 MED ORDER — ONDANSETRON HCL 4 MG PO TABS: 4.0000 mg | ORAL_TABLET | Freq: Four times a day (QID) | ORAL | 0 refills | Status: DC

## 2023-10-14 MED ORDER — AMOXICILLIN-POT CLAVULANATE 875-125 MG PO TABS
1.0000 | ORAL_TABLET | Freq: Two times a day (BID) | ORAL | 0 refills | Status: AC
Start: 2023-10-14 — End: 2023-10-19

## 2023-10-14 MED ORDER — SODIUM CHLORIDE 0.9 % IV BOLUS
1000.0000 mL | Freq: Once | INTRAVENOUS | Status: AC
  Administered 2023-10-14: 1000 mL via INTRAVENOUS

## 2023-10-14 NOTE — ED Provider Notes (Signed)
[place] EMERGENCY DEPARTMENT AT [facility] Provider Note   CSN: [id number] Arrival date & time: 10/14/23  1829     History  No chief complaint on file.   Maxwell Faulkner is a 37 y.o. male.  37 year old male present emergency department for right lower quadrant abdominal pain.  Reported symptoms for the past week and a half.  Reported nausea and vomiting.  No fevers or chills.  Pain is been constant.  Triage note mentions patient with hematuria, however patient states that he has had he is not had any hematuria.  He notes sometimes having blood on toilet paper when he wipes.        Home Medications Prior to Admission medications   Medication Sig Start Date End Date Taking? Authorizing Provider  celecoxib (CELEBREX) 200 MG capsule Take 1 capsule (200 mg total) by mouth 2 (two) times daily. 08/23/23   Arthor Captain, PA-C  cyclobenzaprine (FLEXERIL) 10 MG tablet Take 1 tablet (10 mg total) by mouth 2 (two) times daily as needed for muscle spasms. 07/25/23   Windell Norfolk, MD  diazePAM, 15 MG Dose, (VALTOCO 15 MG DOSE) 2 x 7.5 MG/0.1ML LQPK Place 15 mg into the nose as needed (For seizure lasting more than 5 minutes). 04/09/23   Windell Norfolk, MD  divalproex (DEPAKOTE) 500 MG DR tablet TAKE 2 TABLETS(1000 MG) BY MOUTH TWICE DAILY 09/13/23   Windell Norfolk, MD  methocarbamol (ROBAXIN) 500 MG tablet Take 1 tablet (500 mg total) by mouth 3 (three) times daily as needed for muscle spasms. 08/23/23   Harris, Abigail, PA-C  QUEtiapine (SEROQUEL) 50 MG tablet Take 1 tablet (50 mg total) by mouth at bedtime. 09/07/23   Lauro Franklin, MD  sertraline (ZOLOFT) 50 MG tablet Take 1 tablet (50 mg total) by mouth daily. 09/07/23   Lauro Franklin, MD      Allergies    Patient has no known allergies.    Review of Systems   Review of Systems  Physical Exam Updated Vital Signs BP 125/79   Pulse 60   Temp 98.7 F (37.1 C) (Oral)   Resp 16   Ht 5\' 6"  (1.676 m)   Wt  50.5 kg   SpO2 100%   BMI 17.97 kg/m  Physical Exam Vitals and nursing note reviewed.  Constitutional:      General: He is not in acute distress.    Appearance: He is not toxic-appearing.  HENT:     Head: Normocephalic.     Mouth/Throat:     Mouth: Mucous membranes are moist.  Eyes:     Conjunctiva/sclera: Conjunctivae normal.  Cardiovascular:     Rate and Rhythm: Normal rate and regular rhythm.  Pulmonary:     Effort: Pulmonary effort is normal.  Abdominal:     General: Abdomen is flat.     Tenderness: There is abdominal tenderness. There is no guarding or rebound.     Comments: Patient with right lower quadrant and left lower quadrant tenderness  Musculoskeletal:        General: Normal range of motion.  Skin:    General: Skin is warm.     Capillary Refill: Capillary refill takes less than 2 seconds.  Neurological:     Mental Status: He is alert and oriented to person, place, and time.  Psychiatric:        Mood and Affect: Mood normal.        Behavior: Behavior normal.     ED Results /  Procedures / Treatments   Labs (all labs ordered are listed, but only abnormal results are displayed) Labs Reviewed  COMPREHENSIVE METABOLIC PANEL - Abnormal; Notable for the following components:      Result Value   Glucose, Bld 159 (*)    Calcium 8.8 (*)    Total Protein 6.3 (*)    Albumin 3.0 (*)    All other components within normal limits  CBC - Abnormal; Notable for the following components:   RBC 3.76 (*)    Hemoglobin 11.9 (*)    HCT 35.9 (*)    All other components within normal limits  URINALYSIS, ROUTINE W REFLEX MICROSCOPIC - Abnormal; Notable for the following components:   Color, Urine AMBER (*)    Specific Gravity, Urine 1.034 (*)    Ketones, ur 5 (*)    Protein, ur 30 (*)    All other components within normal limits  SARS CORONAVIRUS 2 BY RT PCR  LIPASE, BLOOD    EKG None  Radiology No results found.  Procedures Procedures    Medications Ordered  in ED Medications  ondansetron (ZOFRAN) injection 4 mg (4 mg Intravenous Given 10/14/23 2102)  ketorolac (TORADOL) 15 MG/ML injection 15 mg (15 mg Intravenous Given 10/14/23 2103)  sodium chloride 0.9 % bolus 1,000 mL (0 mLs Intravenous Stopped 10/14/23 2208)  iohexol (OMNIPAQUE) 350 MG/ML injection 75 mL (75 mLs Intravenous Contrast Given 10/14/23 2303)    ED Course/ Medical Decision Making/ A&P Clinical Course as of 10/14/23 2347  Sun Oct 14, 2023  2346 CT ABDOMEN PELVIS W CONTRAST IMPRESSION: 1. Findings suspicious for colitis involving the hepatic flexure to the rectosigmoid colon. 2. Possible gallbladder wall thickening, consider correlation with ultrasound. 3. Emphysema. 4. Aortic atherosclerosis.  Aortic Atherosclerosis (ICD10-I70.0) and Emphysema (ICD10-J43.9).   [TY]    Clinical Course User Index [TY] Coral Spikes, DO                                 Medical Decision Making 37 year old male present emergency department with lower abdominal pain.  He is afebrile nontachycardic hemodynamically stable.  Physical exam with tenderness in right lower quadrant and left lower quadrant.  No leukocytosis fever or tachycardia to suggest systemic infection.  Triage provider had ordered COVID, it is negative.  CMP with normal electrolytes.  Normal kidney function.  No transaminitis to suggest hepatobiliary disease.  Concern for possible appendicitis given location of tenderness on exam.  CT scan was ordered.  UA without evidence of urinary tract infection.  Patient given Toradol, Zofran and IV fluids.Care signed out to overnight team; dispo pending CT scan.   Amount and/or Complexity of Data Reviewed Labs: ordered. Radiology: ordered.  Risk Prescription drug management.          Final Clinical Impression(s) / ED Diagnoses Final diagnoses:  None    Rx / DC Orders ED Discharge Orders     None         Coral Spikes, DO 10/14/23 2317

## 2023-10-14 NOTE — Discharge Instructions (Addendum)
Follow-up primary doctor.  Please try to maintain with frequent sips of clears.  Return immediately felt fevers, chills, worsening abdominal pain, uncontrolled nausea vomiting or you stop moving your bowels.  You may also return if develop any new or worsening symptoms that are concerning to you.

## 2023-10-14 NOTE — ED Notes (Signed)
Patient returned from CT

## 2023-10-14 NOTE — ED Triage Notes (Signed)
Pt c/o RLQ, LLQ painx3-4d. Dry coughx2-3d. Vomiting started last night. Pt c/o dysuria and hematuriax38mos

## 2023-10-14 NOTE — ED Notes (Signed)
Patient wanted to take at home seizure medication at 2000 and was worried about missing his dose. After consulting Remy MD about the patient taking his medication, it was deemed safe for the patient to take his home medication.

## 2023-10-23 ENCOUNTER — Emergency Department (HOSPITAL_COMMUNITY)
Admission: EM | Admit: 2023-10-23 | Discharge: 2023-10-23 | Disposition: A | Discharge status: Home / Self Care | Payer: Medicaid Other | Attending: Emergency Medicine | Admitting: Emergency Medicine

## 2023-10-23 ENCOUNTER — Other Ambulatory Visit: Payer: Self-pay

## 2023-10-23 ENCOUNTER — Emergency Department (HOSPITAL_COMMUNITY): Payer: Medicaid Other

## 2023-10-23 DIAGNOSIS — S0990XA Unspecified injury of head, initial encounter: Secondary | ICD-10-CM | POA: Diagnosis not present

## 2023-10-23 DIAGNOSIS — R519 Headache, unspecified: Secondary | ICD-10-CM | POA: Insufficient documentation

## 2023-10-23 DIAGNOSIS — R410 Disorientation, unspecified: Secondary | ICD-10-CM | POA: Diagnosis not present

## 2023-10-23 DIAGNOSIS — M545 Low back pain, unspecified: Secondary | ICD-10-CM | POA: Insufficient documentation

## 2023-10-23 DIAGNOSIS — S0081XA Abrasion of other part of head, initial encounter: Secondary | ICD-10-CM | POA: Diagnosis not present

## 2023-10-23 DIAGNOSIS — W19XXXA Unspecified fall, initial encounter: Secondary | ICD-10-CM | POA: Diagnosis not present

## 2023-10-23 DIAGNOSIS — J439 Emphysema, unspecified: Secondary | ICD-10-CM | POA: Diagnosis not present

## 2023-10-23 DIAGNOSIS — R569 Unspecified convulsions: Secondary | ICD-10-CM | POA: Diagnosis not present

## 2023-10-23 DIAGNOSIS — M546 Pain in thoracic spine: Secondary | ICD-10-CM | POA: Diagnosis not present

## 2023-10-23 DIAGNOSIS — S199XXA Unspecified injury of neck, initial encounter: Secondary | ICD-10-CM | POA: Diagnosis not present

## 2023-10-23 DIAGNOSIS — I10 Essential (primary) hypertension: Secondary | ICD-10-CM | POA: Diagnosis not present

## 2023-10-23 LAB — BASIC METABOLIC PANEL
Anion gap: 12 (ref 5–15)
BUN: 10 mg/dL (ref 6–20)
CO2: 24 mmol/L (ref 22–32)
Calcium: 9.2 mg/dL (ref 8.9–10.3)
Chloride: 104 mmol/L (ref 98–111)
Creatinine, Ser: 0.89 mg/dL (ref 0.61–1.24)
GFR, Estimated: >60 mL/min (ref >60)
Glucose, Bld: 95 mg/dL (ref 70–99)
Potassium: 4.3 mmol/L (ref 3.5–5.1)
Sodium: 140 mmol/L (ref 135–145)

## 2023-10-23 LAB — CBC
HCT: 34.5 % — ABNORMAL LOW (ref 39.0–52.0)
Hemoglobin: 11.6 g/dL — ABNORMAL LOW (ref 13.0–17.0)
MCH: 31.8 pg (ref 26.0–34.0)
MCHC: 33.6 g/dL (ref 30.0–36.0)
MCV: 94.5 fL (ref 80.0–100.0)
Platelets: 209 10*3/uL (ref 150–400)
RBC: 3.65 MIL/uL — ABNORMAL LOW (ref 4.22–5.81)
RDW: 13.9 % (ref 11.5–15.5)
WBC: 10 10*3/uL (ref 4.0–10.5)
nRBC: 0 % (ref 0.0–0.2)

## 2023-10-23 LAB — CBG MONITORING, ED: Glucose-Capillary: 102 mg/dL — ABNORMAL HIGH (ref 70–99)

## 2023-10-23 LAB — VALPROIC ACID LEVEL: Valproic Acid Lvl: 71 ug/mL (ref 50.0–100.0)

## 2023-10-23 MED ORDER — DIVALPROEX SODIUM 250 MG PO DR TAB
1000.0000 mg | DELAYED_RELEASE_TABLET | Freq: Once | ORAL | Status: AC
  Administered 2023-10-23: 1000 mg via ORAL
  Filled 2023-10-23: qty 4

## 2023-10-23 MED ORDER — ONDANSETRON HCL 4 MG/2ML IJ SOLN
4.0000 mg | Freq: Once | INTRAMUSCULAR | Status: AC
  Administered 2023-10-23: 4 mg via INTRAVENOUS
  Filled 2023-10-23: qty 2

## 2023-10-23 MED ORDER — ACETAMINOPHEN 325 MG PO TABS
650.0000 mg | ORAL_TABLET | Freq: Once | ORAL | Status: AC
Start: 2023-10-23 — End: 2023-10-23
  Administered 2023-10-23: 650 mg via ORAL
  Filled 2023-10-23: qty 2

## 2023-10-23 NOTE — Discharge Instructions (Addendum)
Continue your current medications.  Follow-up with your neurologist to discuss your seizure medications

## 2023-10-23 NOTE — ED Provider Notes (Signed)
[place] EMERGENCY DEPARTMENT AT [facility] Provider Note   CSN: [id number] Arrival date & time: 10/23/23  1801     History  Chief Complaint  Patient presents with   Seizures    Maxwell Faulkner is a 37 y.o. male.   Seizures    Patient has a history of hypertension anxiety depression and seizures.  He presents to the ED for evaluation of a seizure.  Patient was at home when he noticed he started kicking with his foot.  That is the last thing he remembers.  Family went to check on him because he had not called them.  Patient was found to have an abrasion on his head.  He was confused.  He seemed like he had another seizure.  Patient now states he is just feeling achy all over.  He does have a headache and back pain.  He states he has been taking his medications for his seizures he denies any fevers or chills.  No recent injury  Home Medications Prior to Admission medications   Medication Sig Start Date End Date Taking? Authorizing Provider  celecoxib (CELEBREX) 200 MG capsule Take 1 capsule (200 mg total) by mouth 2 (two) times daily. 08/23/23   Arthor Captain, PA-C  cyclobenzaprine (FLEXERIL) 10 MG tablet Take 1 tablet (10 mg total) by mouth 2 (two) times daily as needed for muscle spasms. 07/25/23   Windell Norfolk, MD  diazePAM, 15 MG Dose, (VALTOCO 15 MG DOSE) 2 x 7.5 MG/0.1ML LQPK Place 15 mg into the nose as needed (For seizure lasting more than 5 minutes). 04/09/23   Windell Norfolk, MD  divalproex (DEPAKOTE) 500 MG DR tablet TAKE 2 TABLETS(1000 MG) BY MOUTH TWICE DAILY 09/13/23   Windell Norfolk, MD  methocarbamol (ROBAXIN) 500 MG tablet Take 1 tablet (500 mg total) by mouth 3 (three) times daily as needed for muscle spasms. 08/23/23   Harris, Abigail, PA-C  ondansetron (ZOFRAN) 4 MG tablet Take 1 tablet (4 mg total) by mouth every 6 (six) hours. 10/14/23   Coral Spikes, DO  QUEtiapine (SEROQUEL) 50 MG tablet Take 1 tablet (50 mg total) by mouth at bedtime. 09/07/23    Lauro Franklin, MD  sertraline (ZOLOFT) 50 MG tablet Take 1 tablet (50 mg total) by mouth daily. 09/07/23   Lauro Franklin, MD      Allergies    Patient has no known allergies.    Review of Systems   Review of Systems  Neurological:  Positive for seizures.    Physical Exam Updated Vital Signs BP (!) 143/79   Pulse 66   Temp 97.7 F (36.5 C)   Resp (!) 21   SpO2 100%  Physical Exam Vitals and nursing note reviewed.  Constitutional:      Appearance: He is well-developed.  HENT:     Head: Normocephalic.     Comments: Abrasion noted to the forehead    Right Ear: External ear normal.     Left Ear: External ear normal.  Eyes:     General: No scleral icterus.       Right eye: No discharge.        Left eye: No discharge.     Conjunctiva/sclera: Conjunctivae normal.  Neck:     Trachea: No tracheal deviation.  Cardiovascular:     Rate and Rhythm: Normal rate and regular rhythm.  Pulmonary:     Effort: Pulmonary effort is normal. No respiratory distress.     Breath sounds: Normal  breath sounds. No stridor. No wheezing or rales.  Abdominal:     General: Bowel sounds are normal. There is no distension.     Palpations: Abdomen is soft.     Tenderness: There is no abdominal tenderness. There is no guarding or rebound.  Musculoskeletal:        General: No deformity.     Cervical back: Neck supple. Tenderness present.     Thoracic back: Tenderness present.     Lumbar back: Tenderness present.     Comments: Small abrasion noted to the big toe  Skin:    General: Skin is warm and dry.     Findings: No rash.  Neurological:     General: No focal deficit present.     Mental Status: He is alert.     Cranial Nerves: No cranial nerve deficit, dysarthria or facial asymmetry.     Sensory: No sensory deficit.     Motor: No abnormal muscle tone or seizure activity.     Coordination: Coordination normal.  Psychiatric:        Mood and Affect: Mood normal.     ED Results  / Procedures / Treatments   Labs (all labs ordered are listed, but only abnormal results are displayed) Labs Reviewed  CBC - Abnormal; Notable for the following components:      Result Value   RBC 3.65 (*)    Hemoglobin 11.6 (*)    HCT 34.5 (*)    All other components within normal limits  CBG MONITORING, ED - Abnormal; Notable for the following components:   Glucose-Capillary 102 (*)    All other components within normal limits  BASIC METABOLIC PANEL  VALPROIC ACID LEVEL    EKG None  Radiology CT Head Wo Contrast Result Date: 10/23/2023 CLINICAL DATA:  Trauma.  Midline tenderness. EXAM: CT HEAD WITHOUT CONTRAST CT CERVICAL SPINE WITHOUT CONTRAST TECHNIQUE: Multidetector CT imaging of the head and cervical spine was performed following the standard protocol without intravenous contrast. Multiplanar CT image reconstructions of the cervical spine were also generated. RADIATION DOSE REDUCTION: This exam was performed according to the departmental dose-optimization program which includes automated exposure control, adjustment of the mA and/or kV according to patient size and/or use of iterative reconstruction technique. COMPARISON:  Head CT dated 05/23/2023. FINDINGS: CT HEAD FINDINGS Brain: The ventricles and sulci are appropriate size for the patient's age. The gray-white matter discrimination is preserved. There is no acute intracranial hemorrhage. No mass effect or midline shift. No extra-axial fluid collection. Vascular: No hyperdense vessel or unexpected calcification. Skull: Normal. Negative for fracture or focal lesion. Sinuses/Orbits: No acute finding. Other: None CT CERVICAL SPINE FINDINGS Alignment: No acute subluxation. There is straightening of normal cervical lordosis which may be positional or due to muscle spasm. Skull base and vertebrae: No acute fracture. Soft tissues and spinal canal: No prevertebral fluid or swelling. No visible canal hematoma. Disc levels:  No acute findings.  Upper chest: Emphysema. Other: None IMPRESSION: 1. No acute intracranial pathology. 2. No acute/traumatic cervical spine pathology. 3.  Emphysema (ICD10-J43.9). Electronically Signed   By: Elgie Collard M.D.   On: 10/23/2023 20:01   CT Cervical Spine Wo Contrast Result Date: 10/23/2023 CLINICAL DATA:  Trauma.  Midline tenderness. EXAM: CT HEAD WITHOUT CONTRAST CT CERVICAL SPINE WITHOUT CONTRAST TECHNIQUE: Multidetector CT imaging of the head and cervical spine was performed following the standard protocol without intravenous contrast. Multiplanar CT image reconstructions of the cervical spine were also generated. RADIATION DOSE REDUCTION: This  exam was performed according to the departmental dose-optimization program which includes automated exposure control, adjustment of the mA and/or kV according to patient size and/or use of iterative reconstruction technique. COMPARISON:  Head CT dated 05/23/2023. FINDINGS: CT HEAD FINDINGS Brain: The ventricles and sulci are appropriate size for the patient's age. The gray-white matter discrimination is preserved. There is no acute intracranial hemorrhage. No mass effect or midline shift. No extra-axial fluid collection. Vascular: No hyperdense vessel or unexpected calcification. Skull: Normal. Negative for fracture or focal lesion. Sinuses/Orbits: No acute finding. Other: None CT CERVICAL SPINE FINDINGS Alignment: No acute subluxation. There is straightening of normal cervical lordosis which may be positional or due to muscle spasm. Skull base and vertebrae: No acute fracture. Soft tissues and spinal canal: No prevertebral fluid or swelling. No visible canal hematoma. Disc levels:  No acute findings. Upper chest: Emphysema. Other: None IMPRESSION: 1. No acute intracranial pathology. 2. No acute/traumatic cervical spine pathology. 3.  Emphysema (ICD10-J43.9). Electronically Signed   By: Elgie Collard M.D.   On: 10/23/2023 20:01   DG Lumbar Spine 2-3 Views Result  Date: 10/23/2023 CLINICAL DATA:  Seizures today.  Pain. EXAM: LUMBAR SPINE - 2-3 VIEW COMPARISON:  03/29/2019 FINDINGS: There is no evidence of lumbar spine fracture. Alignment is normal. Intervertebral disc spaces are maintained. IMPRESSION: Normal alignment.  No acute displaced fractures identified. Electronically Signed   By: Burman Nieves M.D.   On: 10/23/2023 19:31   DG Thoracic Spine 2 View Result Date: 10/23/2023 CLINICAL DATA:  Seizure today.  Pain. EXAM: THORACIC SPINE 2 VIEWS COMPARISON:  03/29/2019 FINDINGS: There is no evidence of thoracic spine fracture. Alignment is normal. No other significant bone abnormalities are identified. Visualization on the lateral view is technically limited due to overlying soft tissues. IMPRESSION: Normal alignment.  No acute displaced fractures identified. Electronically Signed   By: Burman Nieves M.D.   On: 10/23/2023 19:30    Procedures Procedures    Medications Ordered in ED Medications  acetaminophen (TYLENOL) tablet 650 mg (650 mg Oral Given 10/23/23 1949)  ondansetron (ZOFRAN) injection 4 mg (4 mg Intravenous Given 10/23/23 1948)  divalproex (DEPAKOTE) DR tablet 1,000 mg (1,000 mg Oral Given 10/23/23 2015)    ED Course/ Medical Decision Making/ A&P Clinical Course as of 10/23/23 2104  Tue Oct 23, 2023  2032 CBC shows stable anemia.  Metabolic panel normal. [JK]  2032 No signs of acute abnormality on head CT, C-spine CT lumbar or thoracic spine [JK]  2032 Valproic Acid (depakote) Level (if patient is taking this medication) Valproic acid in the therapeutic range [JK]    Clinical Course User Index [JK] Linwood Dibbles, MD                                 Medical Decision Making Problems Addressed: Seizure [facility]): acute illness or injury that poses a threat to life or bodily functions  Amount and/or Complexity of Data Reviewed Labs: ordered. Decision-making details documented in ED Course. Radiology: ordered and independent  interpretation performed.  Risk OTC drugs. Prescription drug management.   Patient presented to the ED for evaluation after a seizure.  Patient has known seizure disorder.  No recent infection.  No recent injuries.  Patient's laboratory test do not show any significant abnormalities.  Patient valproic acid levels in the therapeutic range.  No signs of any serious injuries associated with the seizure.  Patient was monitored for several hours.  No recurrent seizures.  Will have him continue his home regimen for now but encourage close follow-up with his neurologist to see if there needs to be any adjustment in his outpatient regimen        Final Clinical Impression(s) / ED Diagnoses Final diagnoses:  Seizure (HCC)    Rx / DC Orders ED Discharge Orders          Ordered    Ambulatory referral to Neurology       Comments: An appointment is requested in approximately: 2 weeks   10/23/23 2103              Linwood Dibbles, MD 10/23/23 2107

## 2023-10-23 NOTE — ED Triage Notes (Signed)
Patient arrives with mother who suspects he has had a seizure today. Hx of seizures and compliant with medications. She said the patient has not called her today so she went to check on him and he was acting very confused. She saw him last normal last night before bed. Patient has abrasion to forehead and states that he had a seizure and fell today. Oriented but slow to respond.

## 2023-10-23 NOTE — ED Notes (Signed)
EDP at [facility], pt awake, sleepy, slow, interactive, answers questions, follows commands, no sz activity noted. Airway patent. VSS.

## 2023-11-13 ENCOUNTER — Telehealth: Payer: Self-pay | Admitting: Neurology

## 2023-11-13 NOTE — Telephone Encounter (Signed)
 Pt said had a seizure on 10/23/23. Was walking around and fell out in the floor. Hit head and legs was scratched up from being under the bed and legs were under the dresser. Daughter and son drove patient the hospital. Would like a earlier appt  than 02/14/24.

## 2023-11-14 NOTE — Telephone Encounter (Signed)
 Patient returned call and stated on 10/23/23 he had a generalized seizure and fell and hit his head and scrapped his leg. He somehow ended under the dresser and bed. His son found him on the floor and didn't administered the Valtoco  due to no longer seizing. He did go to the ER, and reports no medication changes or fractures. He dose report missing " a couple" of medication doses prior to the seizure. Reviewed seizure triggers and medication compliance. Advised I would send to Dr. Samara Crest for review.  Patient verbalized understanding an appreciative  of call.

## 2023-11-14 NOTE — Telephone Encounter (Signed)
 Call to patient, no answer. Unable to leave voicemail. MyChart message sent. And appointment added to wait list and sent to Dr. Samara Crest to see if need to be worked in.

## 2023-11-14 NOTE — Telephone Encounter (Signed)
 Yes, add him to the cancellation list

## 2023-11-14 NOTE — Telephone Encounter (Signed)
 Pt returning call. Transferred to POD 2

## 2023-11-15 ENCOUNTER — Telehealth (HOSPITAL_COMMUNITY): Payer: Self-pay | Admitting: *Deleted

## 2023-11-15 ENCOUNTER — Telehealth: Payer: Self-pay

## 2023-11-15 NOTE — Telephone Encounter (Signed)
Medication problem - Call from pt stating he has not been able to fill his Quetiapine, down to 2 tablets left, due to something being needed by pharmacy. Called pt's [facility] and agreed to contact pt's [place] [employer] to complete safety questions. Attempted to complete this online with CoverMyMeds 3 times today today and also called [phone] five times but could never get through to complete the safety questions for patients need prior authorization. Consulted with our DSS medicaid worker here at [facility] to ensure we had patients correct insurance information and she will attempt to find another number to call for [facility] as they do not recognize patient's Medicaid ID number or Dr. Camila Li NPI number.

## 2023-11-15 NOTE — Telephone Encounter (Signed)
Fax received for prior authorization of Quetiapine 50mg . Submitted online with cover my meds. Awaiting decision.

## 2023-11-16 NOTE — Telephone Encounter (Signed)
Medication management - Attempted to contact patient after speaking with representative at [facility] and with patient's [facility] to inform patient his Quetiapine 50 mg, #90 for 90 days was approved through 11/14/24 and that his pharmacy had it ready to pick up.  No answer to patients phone and no option to leave a message.

## 2023-11-20 ENCOUNTER — Other Ambulatory Visit: Payer: Self-pay | Admitting: Neurology

## 2023-11-30 ENCOUNTER — Encounter (HOSPITAL_COMMUNITY): Payer: Medicaid Other | Admitting: Student in an Organized Health Care Education/Training Program

## 2023-11-30 NOTE — Progress Notes (Deleted)
 [facility] MD/PA/NP OP Progress Note  11/30/2023 7:04 AM Maxwell Faulkner  MRN:  [id number]  Chief Complaint:  No chief complaint on file.  HPI:  Maxwell Faulkner is a 38 yr old male who presents for Follow Up and Medication Management.  PPHx is significant for Depression and Anxiety, and no Suicide Attempts, Self Injurious Behavior, or Hospitalizations.   He reports ***   Visit Diagnosis:  No diagnosis found.     Past Psychiatric History: Depression and Anxiety, and no Suicide Attempts, Self Injurious Behavior, or Hospitalizations.  Past Medical History:  Past Medical History:  Diagnosis Date   Anxiety    Depression    Hypertension    Seizures (HCC)     Past Surgical History:  Procedure Laterality Date   WISDOM TOOTH EXTRACTION      Family Psychiatric History: No Known Diagnosis', Substance Abuse, or Suicides.   Family History:  Family History  Problem Relation Age of Onset   Hypertension Mother     Social History:  Social History   Socioeconomic History   Marital status: Significant Other    Spouse name: Maxwell Faulkner   Number of children: 3   Years of education: Not on file   Highest education level: Not on file  Occupational History   Not on file  Tobacco Use   Smoking status: Every Day    Current packs/day: 1.00    Types: Cigarettes   Smokeless tobacco: Never  Vaping Use   Vaping status: Never Used  Substance and Sexual Activity   Alcohol use: Not Currently    Comment: stopped drinking   Drug use: Yes    Types: Marijuana   Sexual activity: Yes    Birth control/protection: None  Other Topics Concern   Not on file  Social History Narrative   Right handed   Caffeine 1 cup daily   Social Drivers of Corporate investment banker Strain: Not on file  Food Insecurity: Not on file  Transportation Needs: No Transportation Needs (01/05/2023)   PRAPARE - Administrator, Civil Service (Medical): No    Lack of Transportation (Non-Medical): No   Physical Activity: Not on file  Stress: Not on file  Social Connections: Not on file    Allergies: No Known Allergies  Metabolic Disorder Labs: No results found for: "HGBA1C", "MPG" No results found for: "PROLACTIN" Lab Results  Component Value Date   CHOL 126 06/02/2022   TRIG 60 06/02/2022   HDL 44 06/02/2022   CHOLHDL 2.9 06/02/2022   LDLCALC 69 06/02/2022   No results found for: "TSH"  Therapeutic Level Labs: No results found for: "LITHIUM" Lab Results  Component Value Date   VALPROATE 71 10/23/2023   VALPROATE 84 08/01/2023   No results found for: "CBMZ"  Current Medications: Current Outpatient Medications  Medication Sig Dispense Refill   celecoxib (CELEBREX) 200 MG capsule Take 1 capsule (200 mg total) by mouth 2 (two) times daily. 20 capsule 0   cyclobenzaprine (FLEXERIL) 10 MG tablet Take 1 tablet (10 mg total) by mouth 2 (two) times daily as needed for muscle spasms. 15 tablet 0   diazePAM, 15 MG Dose, (VALTOCO 15 MG DOSE) 2 x 7.5 MG/0.1ML LQPK Place 15 mg into the nose as needed (For seizure lasting more than 5 minutes). 5 each 5   divalproex (DEPAKOTE) 500 MG DR tablet TAKE 2 TABLETS(1000 MG) BY MOUTH TWICE DAILY 120 tablet 0   methocarbamol (ROBAXIN) 500 MG tablet Take 1 tablet (  500 mg total) by mouth 3 (three) times daily as needed for muscle spasms. 21 tablet 0   ondansetron (ZOFRAN) 4 MG tablet Take 1 tablet (4 mg total) by mouth every 6 (six) hours. 12 tablet 0   QUEtiapine (SEROQUEL) 50 MG tablet Take 1 tablet (50 mg total) by mouth at bedtime. 90 tablet 0   sertraline (ZOLOFT) 50 MG tablet Take 1 tablet (50 mg total) by mouth daily. 30 tablet 2   No current facility-administered medications for this visit.     Musculoskeletal: Strength & Muscle Tone: within normal limits Gait & Station: normal Patient leans: N/A *** Psychiatric Specialty Exam: Review of Systems  There were no vitals taken for this visit.There is no height or weight on file to  calculate BMI.  General Appearance: Casual and Fairly Groomed  Eye Contact:  Good  Speech:  Clear and Coherent and Normal Rate  Volume:  Normal  Mood:  Dysphoric  Affect:  Congruent  Thought Process:  Coherent and Goal Directed  Orientation:  Full (Time, Place, and Person)  Thought Content: WDL and Logical   Suicidal Thoughts:  No  Homicidal Thoughts:  No  Memory:  Immediate;   Good Recent;   Good  Judgement:  Good  Insight:  Good  Psychomotor Activity:  Normal  Concentration:  Concentration: Good and Attention Span: Good  Recall:  Good  Fund of Knowledge: Good  Language: Good  Akathisia:  Negative  Handed:  Right  AIMS (if indicated): not done  Assets:  Communication Skills Desire for Improvement Housing Physical Health Resilience Social Support  ADL's:  Intact  Cognition: WNL  Sleep:  Good   Screenings: GAD-7    Flowsheet Row Office Visit from 06/02/2022 in [facility]  Total GAD-7 Score 18      PHQ2-9    Flowsheet Row Office Visit from 06/02/2022 in [facility]  PHQ-2 Total Score 6  PHQ-9 Total Score 23      Flowsheet Row ED from 10/23/2023 in [facility] Department at [facility] ED from 10/14/2023 in [facility] Emergency Department at [facility] ED from 08/23/2023 in [facility] Emergency Department at [facility]  C-SSRS RISK CATEGORY No Risk No Risk No Risk        Assessment and Plan:  Maxwell Faulkner is a 38 yr old male who presents for Follow Up and Medication Management.  PPHx is significant for Depression and Anxiety, and no Suicide Attempts, Self Injurious Behavior, or Hospitalizations.   [name] continues ***   MDD, Recurrant, Moderate  GAD: -Continue Zoloft to 50 mg daily.  30 tablets with 2 refills. -Continue Seroquel 50 mg QHS for Augmentation, sleep, and appetite.  90 tablets with 0 refills.     Insomnia: -Continue Seroquel 50 mg QHS for  Augmentation, sleep, and appetite.  90 tablets with 0 refills.   Seizures: -Depakote 1000 mg BID prescribed by Neurology   Collaboration of Care:   Patient/Guardian was advised Release of Information must be obtained prior to any record release in order to collaborate their care with an outside provider. Patient/Guardian was advised if they have not already done so to contact the registration department to sign all necessary forms in order for [place] to release information regarding their care.   Consent: Patient/Guardian gives verbal consent for treatment and assignment of benefits for services provided during this visit. Patient/Guardian expressed understanding and agreed to proceed.    Mardelle Matte  Renaldo Fiddler, MD 11/30/2023, 7:04 AM

## 2023-12-03 ENCOUNTER — Telehealth: Payer: Self-pay

## 2023-12-03 DIAGNOSIS — Z5181 Encounter for therapeutic drug level monitoring: Secondary | ICD-10-CM

## 2023-12-03 DIAGNOSIS — G40909 Epilepsy, unspecified, not intractable, without status epilepticus: Secondary | ICD-10-CM

## 2023-12-03 NOTE — Telephone Encounter (Signed)
Call to patient, he reports having a GTC seizure this morning. He states he fell out of the bed but did not his his head He states his son administered Valtoco. He reports medication compliance, but does state he doesn't sleep well, he doesn't drink water, and he plays a lot of video games. He also reports watching TV in the dark and increased anxiety. Advised I would send to Dr. Teresa Coombs for review but in the meantime to be aware of seizure triggers.

## 2023-12-03 NOTE — Telephone Encounter (Signed)
Patient called in to advise that he had another seizure. States that he had one the day before christmas, he went to the hospital that day because he hit his head. He had another one today while he was sleeping and fell on the floor during it. Did not hit his head. His son witnessed the seizure. His son used the diazePAM, 15 MG Dose, (VALTOCO 15 MG DOSE) 2 x 7.5 MG/0.1ML LQPK.

## 2023-12-04 ENCOUNTER — Telehealth: Payer: Self-pay

## 2023-12-04 DIAGNOSIS — Z0271 Encounter for disability determination: Secondary | ICD-10-CM

## 2023-12-04 MED ORDER — CLOBAZAM 10 MG PO TABS: 10.0000 mg | ORAL_TABLET | Freq: Every evening | ORAL | 5 refills | Status: DC

## 2023-12-04 MED ORDER — CLOBAZAM 10 MG PO TABS: 10.0000 mg | ORAL_TABLET | Freq: Every evening | ORAL | 3 refills | Status: DC

## 2023-12-04 NOTE — Telephone Encounter (Signed)
Call to patient, no answer. Unable to leave message. Mychart message sent as well.  Per verbal conversation with Dr. Teresa Coombs , add clobazam 10 mg nightly.

## 2023-12-04 NOTE — Telephone Encounter (Signed)
 Call to patient, after receiving mychart message today. Patient had another GTC seizure yesterday morning after we had spoke. He reports it happening around 10:30 am. And he fell out of bed. He doesn't think he hit his head. His son did administer Valtoco  again. I advised I would follow up with Dr. Camara but if he has another seizure to give Valtoco  and go to emergency room. Patient verbalized understanding. He reports medication compliance. No alcohol use.

## 2023-12-04 NOTE — Addendum Note (Signed)
Addended byWindell Norfolk on: 12/04/2023 12:50 PM   Modules accepted: Orders

## 2023-12-04 NOTE — Telephone Encounter (Signed)
 Addressed in another encounter

## 2023-12-04 NOTE — Telephone Encounter (Signed)
Please have them walk in for labs.

## 2023-12-04 NOTE — Telephone Encounter (Signed)
 Done

## 2023-12-10 ENCOUNTER — Emergency Department (HOSPITAL_COMMUNITY)
Admission: EM | Admit: 2023-12-10 | Discharge: 2023-12-11 | Disposition: A | Discharge status: Home / Self Care | Payer: Medicaid Other | Attending: Emergency Medicine | Admitting: Emergency Medicine

## 2023-12-10 DIAGNOSIS — L0231 Cutaneous abscess of buttock: Secondary | ICD-10-CM | POA: Diagnosis present

## 2023-12-10 DIAGNOSIS — L03317 Cellulitis of buttock: Secondary | ICD-10-CM

## 2023-12-10 DIAGNOSIS — R6889 Other general symptoms and signs: Secondary | ICD-10-CM

## 2023-12-10 DIAGNOSIS — N3289 Other specified disorders of bladder: Secondary | ICD-10-CM | POA: Diagnosis not present

## 2023-12-10 NOTE — ED Triage Notes (Signed)
 Pt reports abscess x 2 days on right buttocks , pt reports has taken sitz baths, nothing is improving pain. Pt unable to sit down comfortably in triage

## 2023-12-11 ENCOUNTER — Encounter (HOSPITAL_COMMUNITY): Payer: Self-pay | Admitting: Radiology

## 2023-12-11 ENCOUNTER — Emergency Department (HOSPITAL_COMMUNITY): Payer: Medicaid Other

## 2023-12-11 DIAGNOSIS — N3289 Other specified disorders of bladder: Secondary | ICD-10-CM | POA: Diagnosis not present

## 2023-12-11 LAB — CBC WITH DIFFERENTIAL/PLATELET
Abs Immature Granulocytes: 0.02 10*3/uL (ref 0.00–0.07)
Basophils Absolute: 0 10*3/uL (ref 0.0–0.1)
Basophils Relative: 0 %
Eosinophils Absolute: 0.2 10*3/uL (ref 0.0–0.5)
Eosinophils Relative: 3 %
HCT: 33.5 % — ABNORMAL LOW (ref 39.0–52.0)
Hemoglobin: 10.9 g/dL — ABNORMAL LOW (ref 13.0–17.0)
Immature Granulocytes: 0 %
Lymphocytes Relative: 42 %
Lymphs Abs: 2.7 10*3/uL (ref 0.7–4.0)
MCH: 32.4 pg (ref 26.0–34.0)
MCHC: 32.5 g/dL (ref 30.0–36.0)
MCV: 99.7 fL (ref 80.0–100.0)
Monocytes Absolute: 0.5 10*3/uL (ref 0.1–1.0)
Monocytes Relative: 8 %
Neutro Abs: 3 10*3/uL (ref 1.7–7.7)
Neutrophils Relative %: 47 %
Platelets: 159 10*3/uL (ref 150–400)
RBC: 3.36 MIL/uL — ABNORMAL LOW (ref 4.22–5.81)
RDW: 15.2 % (ref 11.5–15.5)
WBC: 6.5 10*3/uL (ref 4.0–10.5)
nRBC: 0 % (ref 0.0–0.2)

## 2023-12-11 LAB — BASIC METABOLIC PANEL
Anion gap: 9 (ref 5–15)
BUN: 13 mg/dL (ref 6–20)
CO2: 27 mmol/L (ref 22–32)
Calcium: 9.3 mg/dL (ref 8.9–10.3)
Chloride: 106 mmol/L (ref 98–111)
Creatinine, Ser: 0.79 mg/dL (ref 0.61–1.24)
GFR, Estimated: >60 mL/min (ref >60)
Glucose, Bld: 92 mg/dL (ref 70–99)
Potassium: 3.9 mmol/L (ref 3.5–5.1)
Sodium: 142 mmol/L (ref 135–145)

## 2023-12-11 MED ORDER — DOXYCYCLINE HYCLATE 100 MG PO TABS
100.0000 mg | ORAL_TABLET | Freq: Once | ORAL | Status: AC
  Administered 2023-12-11: 100 mg via ORAL
  Filled 2023-12-11: qty 1

## 2023-12-11 MED ORDER — DOXYCYCLINE HYCLATE 100 MG PO CAPS: 100.0000 mg | ORAL_CAPSULE | Freq: Two times a day (BID) | ORAL | 0 refills | Status: DC

## 2023-12-11 MED ORDER — IOHEXOL 300 MG/ML  SOLN
100.0000 mL | Freq: Once | INTRAMUSCULAR | Status: AC | PRN
Start: 2023-12-11 — End: 2023-12-11
  Administered 2023-12-11: 100 mL via INTRAVENOUS

## 2023-12-11 MED ORDER — OXYCODONE-ACETAMINOPHEN 5-325 MG PO TABS: 1.0000 | ORAL_TABLET | Freq: Four times a day (QID) | ORAL | 0 refills | Status: DC | PRN

## 2023-12-11 MED ORDER — SERTRALINE HCL 50 MG PO TABS
50.0000 mg | ORAL_TABLET | Freq: Once | ORAL | Status: AC
  Administered 2023-12-11: 50 mg via ORAL
  Filled 2023-12-11: qty 1

## 2023-12-11 MED ORDER — OXYCODONE-ACETAMINOPHEN 5-325 MG PO TABS
1.0000 | ORAL_TABLET | Freq: Once | ORAL | Status: AC
  Administered 2023-12-11: 1 via ORAL
  Filled 2023-12-11: qty 1

## 2023-12-11 MED ORDER — IBUPROFEN 600 MG PO TABS: 600.0000 mg | ORAL_TABLET | Freq: Four times a day (QID) | ORAL | 0 refills | Status: DC | PRN

## 2023-12-11 MED ORDER — IBUPROFEN 200 MG PO TABS
600.0000 mg | ORAL_TABLET | Freq: Once | ORAL | Status: AC
  Administered 2023-12-11: 600 mg via ORAL
  Filled 2023-12-11: qty 3

## 2023-12-11 MED ORDER — DIVALPROEX SODIUM 500 MG PO DR TAB
1000.0000 mg | DELAYED_RELEASE_TABLET | Freq: Once | ORAL | Status: AC
  Administered 2023-12-11: 1000 mg via ORAL
  Filled 2023-12-11: qty 2

## 2023-12-11 MED ORDER — LIDOCAINE-EPINEPHRINE (PF) 2 %-1:200000 IJ SOLN
10.0000 mL | Freq: Once | INTRAMUSCULAR | Status: DC
  Filled 2023-12-11: qty 20

## 2023-12-11 NOTE — ED Provider Notes (Signed)
[place] [facility]MENT AT [facility] Provider Note   CSN: [id number] Arrival date & time: 12/10/23  2221     History  Chief Complaint  Patient presents with   Abscess    Maxwell Faulkner is a 39 y.o. male.  Patient with recurrent abscess to right lower buttock for the past 2 days. No reported drainage. No fever. He denies any sense of rectal or scrotal involvement.   The history is provided by the patient. No language interpreter was used.  Abscess      Home Medications Prior to Admission medications   Medication Sig Start Date End Date Taking? Authorizing Provider  doxycycline (VIBRAMYCIN) 100 MG capsule Take 1 capsule (100 mg total) by mouth 2 (two) times daily. 12/11/23  Yes Katrice Goel, Melvenia Beam, PA-C  ibuprofen (ADVIL) 600 MG tablet Take 1 tablet (600 mg total) by mouth every 6 (six) hours as needed. 12/11/23  Yes Elpidio Anis, PA-C  oxyCODONE-acetaminophen (PERCOCET/ROXICET) 5-325 MG tablet Take 1 tablet by mouth every 6 (six) hours as needed for severe pain (pain score 7-10). 12/11/23  Yes Arelis Neumeier, PA-C  celecoxib (CELEBREX) 200 MG capsule Take 1 capsule (200 mg total) by mouth 2 (two) times daily. 08/23/23   Harris, Abigail, PA-C  cloBAZam (ONFI) 10 MG tablet Take 1 tablet (10 mg total) by mouth at bedtime. 12/04/23   Windell Norfolk, MD  cyclobenzaprine (FLEXERIL) 10 MG tablet Take 1 tablet (10 mg total) by mouth 2 (two) times daily as needed for muscle spasms. 07/25/23   Windell Norfolk, MD  diazePAM, 15 MG Dose, (VALTOCO 15 MG DOSE) 2 x 7.5 MG/0.1ML LQPK Place 15 mg into the nose as needed (For seizure lasting more than 5 minutes). 04/09/23   Windell Norfolk, MD  divalproex (DEPAKOTE) 500 MG DR tablet TAKE 2 TABLETS(1000 MG) BY MOUTH TWICE DAILY 11/20/23   Windell Norfolk, MD  methocarbamol (ROBAXIN) 500 MG tablet Take 1 tablet (500 mg total) by mouth 3 (three) times daily as needed for muscle spasms. 08/23/23   Harris, Abigail, PA-C  ondansetron (ZOFRAN) 4 MG  tablet Take 1 tablet (4 mg total) by mouth every 6 (six) hours. 10/14/23   Coral Spikes, DO  QUEtiapine (SEROQUEL) 50 MG tablet Take 1 tablet (50 mg total) by mouth at bedtime. 09/07/23   Lauro Franklin, MD  sertraline (ZOLOFT) 50 MG tablet Take 1 tablet (50 mg total) by mouth daily. 09/07/23   Lauro Franklin, MD      Allergies    Patient has no known allergies.    Review of Systems   Review of Systems  Physical Exam Updated Vital Signs BP 125/71 (BP Location: Right Arm)   Pulse 60   Temp 98.2 F (36.8 C) (Oral)   Resp 16   SpO2 100%  Physical Exam Constitutional:      Appearance: He is well-developed.  Pulmonary:     Effort: Pulmonary effort is normal.  Genitourinary:    Rectum: Normal.  Musculoskeletal:        General: Normal range of motion.     Cervical back: Normal range of motion.  Skin:    General: Skin is warm and dry.     Comments: Tender, raised firm lesion to inferior right buttock. No drainage.   Neurological:     Mental Status: He is alert and oriented to person, place, and time.     ED Results / Procedures / Treatments   Labs (all labs ordered are listed, but only  abnormal results are displayed) Labs Reviewed  CBC WITH DIFFERENTIAL/PLATELET - Abnormal; Notable for the following components:      Result Value   RBC 3.36 (*)    Hemoglobin 10.9 (*)    HCT 33.5 (*)    All other components within normal limits  BASIC METABOLIC PANEL    EKG None  Radiology CT PELVIS W CONTRAST Result Date: 12/11/2023 CLINICAL DATA:  Perianal abscess or fistula suspected. EXAM: CT PELVIS WITH CONTRAST TECHNIQUE: Multidetector CT imaging of the pelvis was performed using the standard protocol following the bolus administration of intravenous contrast. RADIATION DOSE REDUCTION: This exam was performed according to the departmental dose-optimization program which includes automated exposure control, adjustment of the mA and/or kV according to patient size  and/or use of iterative reconstruction technique. CONTRAST:  OMNIPAQUE IOHEXOL 300 MG/ML  SOLN COMPARISON:  CT scan abdomen and pelvis from 10/14/2023. FINDINGS: Urinary Tract: No abnormality visualized. Urinary bladder is under distended, precluding optimal assessment. However, no large mass or stones identified. No perivesical fat stranding. Bowel: No disproportionate dilation of the visualized small or large bowel loops. No evidence of abnormal bowel wall thickening or inflammatory changes. The appendix is unremarkable. Vascular/Lymphatic: No ascites or pneumoperitoneum. No pelvic lymphadenopathy, by size criteria. No aneurysmal dilation of the major arteries. Reproductive: Normal-sized prostate gland. Symmetric bilateral seminal vesicles. Bilateral testicles are in the scrotum. Evaluation of testicle is otherwise limited on the CT scan exam. There is small amount of fluid surrounding both testicles which may represent mild hydrocele. Other: There is subtle asymmetric fullness in the right side of perineum which is nonspecific and may represent phlegmonous changes. No discrete drainable abscess or collection noted in the perianal/perineal region. The soft tissues and abdominal wall are otherwise unremarkable. Musculoskeletal: No suspicious osseous lesions. IMPRESSION: *There is subtle asymmetric fullness in the right side of perineum which is nonspecific and may represent phlegmonous changes. No discrete drainable abscess or collection noted in the perianal/perineal region. Correlate with physical examination. *Otherwise no acute inflammatory process identified within the pelvis. *Multiple other nonacute observations, as described above. Electronically Signed   By: Jules Schick M.D.   On: 12/11/2023 13:15    Procedures Procedures    Medications Ordered in ED Medications  lidocaine-EPINEPHrine (XYLOCAINE W/EPI) 2 %-1:200000 (PF) injection 10 mL (10 mLs Infiltration Not Given 12/11/23 1347)   oxyCODONE-acetaminophen (PERCOCET/ROXICET) 5-325 MG per tablet 1 tablet (1 tablet Oral Given 12/11/23 0743)  ibuprofen (ADVIL) tablet 600 mg (600 mg Oral Given 12/11/23 0743)  divalproex (DEPAKOTE) DR tablet 1,000 mg (1,000 mg Oral Given 12/11/23 0921)  iohexol (OMNIPAQUE) 300 MG/ML solution 100 mL (100 mLs Intravenous Contrast Given 12/11/23 1053)  oxyCODONE-acetaminophen (PERCOCET/ROXICET) 5-325 MG per tablet 1 tablet (1 tablet Oral Given 12/11/23 1327)  doxycycline (VIBRA-TABS) tablet 100 mg (100 mg Oral Given 12/11/23 1327)  sertraline (ZOLOFT) tablet 50 mg (50 mg Oral Given by Other 12/11/23 1326)    ED Course/ Medical Decision Making/ A&P Clinical Course as of 12/11/23 1430  Tue Dec 11, 2023  1121 Patient with abscess to right lower buttock. It opened and drained "a lot" per patient after arrival. On recheck, the area is open without further drainage. Discussed that this abscess is recurrent x 3 previous times, same location, raising concern for deeper infection. CT pelvis ordered. Pain addressed.  [SU]  1312 Patient has been sleeping. Now awake and mildly agitated regarding during of ED encounter. Has been here for 17 hours. Discussed that there was a delay in  CT read by radiology. He will wait just a few more minutes but expresses desire to leave.  [SU]  1428 CT scan showing:  IMPRESSION: *There is subtle asymmetric fullness in the right side of perineum which is nonspecific and may represent phlegmonous changes. No discrete drainable abscess or collection noted in the perianal/perineal region. Correlate with physical examination. *Otherwise no acute inflammatory process identified within the pelvis. *Multiple other nonacute observations, as described above.  He has been started on Doxycycline and provided pain management. He will follow up with his doctor for recheck in 3 days to ensure improvement. Discussed return precautions.  [SU]    Clinical Course User Index [SU] Elpidio Anis,  PA-C                                 Medical Decision Making Amount and/or Complexity of Data Reviewed Labs: ordered. Radiology: ordered.  Risk OTC drugs. Prescription drug management.           Final Clinical Impression(s) / ED Diagnoses Final diagnoses:  Cellulitis of buttock  Suspected soft tissue infection    Rx / DC Orders ED Discharge Orders          Ordered    doxycycline (VIBRAMYCIN) 100 MG capsule  2 times daily        12/11/23 1330    oxyCODONE-acetaminophen (PERCOCET/ROXICET) 5-325 MG tablet  Every 6 hours PRN        12/11/23 1330    ibuprofen (ADVIL) 600 MG tablet  Every 6 hours PRN        12/11/23 1330              Elpidio Anis, PA-C 12/11/23 1431    Estelle June A, DO 12/17/23 [phone]

## 2023-12-11 NOTE — Discharge Instructions (Addendum)
Your CT scan shows infection but no "fluid collection" like an abscess, so no further procedure to open the area is necessary today. There is no deeper infection as was the concern.   Take Doxycycline twice daily for the next 10 days. Your doctor should recheck the infection in 3-4 days. Take oxycodone and ibuprofen for pain as directed.   If you run a high fever, there is significantly more swelling or redness, severe pain - return to the ED for recheck.

## 2023-12-14 ENCOUNTER — Ambulatory Visit (INDEPENDENT_AMBULATORY_CARE_PROVIDER_SITE_OTHER): Payer: Medicaid Other | Admitting: Student in an Organized Health Care Education/Training Program

## 2023-12-14 ENCOUNTER — Encounter (HOSPITAL_COMMUNITY): Payer: Self-pay | Admitting: Student in an Organized Health Care Education/Training Program

## 2023-12-14 DIAGNOSIS — F411 Generalized anxiety disorder: Secondary | ICD-10-CM

## 2023-12-14 DIAGNOSIS — F321 Major depressive disorder, single episode, moderate: Secondary | ICD-10-CM

## 2023-12-14 DIAGNOSIS — G4700 Insomnia, unspecified: Secondary | ICD-10-CM | POA: Diagnosis not present

## 2023-12-14 MED ORDER — QUETIAPINE FUMARATE 50 MG PO TABS: 50.0000 mg | ORAL_TABLET | Freq: Every day | ORAL | 0 refills | Status: DC

## 2023-12-14 MED ORDER — SERTRALINE HCL 50 MG PO TABS: 50.0000 mg | ORAL_TABLET | Freq: Every day | ORAL | 2 refills | Status: DC

## 2023-12-14 NOTE — Progress Notes (Signed)
[facility] MD/PA/NP OP Progress Note  12/14/2023 8:43 AM Maxwell Faulkner  MRN:  [id number]  Chief Complaint:  Chief Complaint  Patient presents with   Follow-up   Depression   Anxiety   HPI:  Maxwell Faulkner is a 38 yr old male who presents for Follow Up and Medication Management.  PPHx is significant for Depression and Anxiety, and no Suicide Attempts, Self Injurious Behavior, or Hospitalizations.   He reports he has continued to have significant issues with his seizures.  He reports that he does sometimes forget to take his medication and when he gets upset this seems to trigger them.  Discussed with him the importance of taking his medication regularly and he reported understanding.  Asked if he is being seen by a therapist for coping skills and he reports he is seeing a therapist.  He reports that overall his mood has remained stable and fairly good.  He reports that if anything gets been a little better recently as he is having better interactions with his sons.  Discussed with him that given this we would not make any changes to his medication at this time he is agreeable with this.  He reports no side effects to his medications.  He reports no SI, HI, or AVH.  He reports his sleep is good.  He reports his appetite is good.  He reports no other concerns at present.  He return for follow-up in approximately 2 to 3 months.  Discussed with him that his blood pressure was elevated today.  Discussed with him that if it stays elevated over the long term it can have significant health consequences including but not limited to stroke or MI.  He reports his grandmother has been telling him to get a PCP.  Provided to him with a list of [facility] community care clinics.  He reports he will call to schedule an appointment.   Visit Diagnosis:    ICD-10-CM   1. GAD (generalized anxiety disorder)  F41.1 QUEtiapine (SEROQUEL) 50 MG tablet    sertraline (ZOLOFT) 50 MG tablet    2. Insomnia, unspecified type  G47.00  QUEtiapine (SEROQUEL) 50 MG tablet    sertraline (ZOLOFT) 50 MG tablet    3. Current moderate episode of major depressive disorder, unspecified whether recurrent (HCC)  F32.1 QUEtiapine (SEROQUEL) 50 MG tablet    sertraline (ZOLOFT) 50 MG tablet         Past Psychiatric History: Depression and Anxiety, and no Suicide Attempts, Self Injurious Behavior, or Hospitalizations.  Past Medical History:  Past Medical History:  Diagnosis Date   Anxiety    Depression    Hypertension    Seizures (HCC)     Past Surgical History:  Procedure Laterality Date   WISDOM TOOTH EXTRACTION      Family Psychiatric History: No Known Diagnosis', Substance Abuse, or Suicides.   Family History:  Family History  Problem Relation Age of Onset   Hypertension Mother     Social History:  Social History   Socioeconomic History   Marital status: Significant Other    Spouse name: sierra whitsett   Number of children: 3   Years of education: Not on file   Highest education level: Not on file  Occupational History   Not on file  Tobacco Use   Smoking status: Every Day    Current packs/day: 1.00    Types: Cigarettes   Smokeless tobacco: Never  Vaping Use   Vaping status: Never Used  Substance and  Sexual Activity   Alcohol use: Not Currently    Comment: stopped drinking   Drug use: Yes    Types: Marijuana   Sexual activity: Yes    Birth control/protection: None  Other Topics Concern   Not on file  Social History Narrative   Right handed   Caffeine 1 cup daily   Social Drivers of Health   Financial Resource Strain: Not on file  Food Insecurity: Not on file  Transportation Needs: No Transportation Needs (01/05/2023)   PRAPARE - Transportation    Lack of Transportation (Medical): No    Lack of Transportation (Non-Medical): No  Physical Activity: Not on file  Stress: Not on file  Social Connections: Not on file    Allergies: No Known Allergies  Metabolic Disorder Labs: No results  found for: "HGBA1C", "MPG" No results found for: "PROLACTIN" Lab Results  Component Value Date   CHOL 126 06/02/2022   TRIG 60 06/02/2022   HDL 44 06/02/2022   CHOLHDL 2.9 06/02/2022   LDLCALC 69 06/02/2022   No results found for: "TSH"  Therapeutic Level Labs: No results found for: "LITHIUM" Lab Results  Component Value Date   VALPROATE 71 10/23/2023   VALPROATE 84 08/01/2023   No results found for: "CBMZ"  Current Medications: Current Outpatient Medications  Medication Sig Dispense Refill   celecoxib (CELEBREX) 200 MG capsule Take 1 capsule (200 mg total) by mouth 2 (two) times daily. 20 capsule 0   cloBAZam (ONFI) 10 MG tablet Take 1 tablet (10 mg total) by mouth at bedtime. 30 tablet 5   cyclobenzaprine (FLEXERIL) 10 MG tablet Take 1 tablet (10 mg total) by mouth 2 (two) times daily as needed for muscle spasms. 15 tablet 0   diazePAM, 15 MG Dose, (VALTOCO 15 MG DOSE) 2 x 7.5 MG/0.1ML LQPK Place 15 mg into the nose as needed (For seizure lasting more than 5 minutes). 5 each 5   divalproex (DEPAKOTE) 500 MG DR tablet TAKE 2 TABLETS(1000 MG) BY MOUTH TWICE DAILY 120 tablet 0   doxycycline (VIBRAMYCIN) 100 MG capsule Take 1 capsule (100 mg total) by mouth 2 (two) times daily. 20 capsule 0   ibuprofen (ADVIL) 600 MG tablet Take 1 tablet (600 mg total) by mouth every 6 (six) hours as needed. 20 tablet 0   methocarbamol (ROBAXIN) 500 MG tablet Take 1 tablet (500 mg total) by mouth 3 (three) times daily as needed for muscle spasms. 21 tablet 0   ondansetron (ZOFRAN) 4 MG tablet Take 1 tablet (4 mg total) by mouth every 6 (six) hours. 12 tablet 0   oxyCODONE-acetaminophen (PERCOCET/ROXICET) 5-325 MG tablet Take 1 tablet by mouth every 6 (six) hours as needed for severe pain (pain score 7-10). 10 tablet 0   QUEtiapine (SEROQUEL) 50 MG tablet Take 1 tablet (50 mg total) by mouth at bedtime. 90 tablet 0   sertraline (ZOLOFT) 50 MG tablet Take 1 tablet (50 mg total) by mouth daily. 30  tablet 2   No current facility-administered medications for this visit.     Musculoskeletal: Strength & Muscle Tone: within normal limits Gait & Station: normal Patient leans: N/A  Psychiatric Specialty Exam: Review of Systems  Respiratory:  Negative for cough and shortness of breath.   Cardiovascular:  Negative for chest pain.  Gastrointestinal:  Negative for abdominal pain, constipation, diarrhea, nausea and vomiting.  Neurological:  Negative for weakness and headaches.  Psychiatric/Behavioral:  Positive for dysphoric mood (mild). Negative for hallucinations and sleep disturbance. The patient is  nervous/anxious (mild).     There were no vitals taken for this visit.There is no height or weight on file to calculate BMI.  General Appearance: Casual and Fairly Groomed  Eye Contact:  Good  Speech:  Clear and Coherent and Normal Rate  Volume:  Normal  Mood:  Dysphoric  Affect:  Congruent  Thought Process:  Coherent and Goal Directed  Orientation:  Full (Time, Place, and Person)  Thought Content: WDL and Logical   Suicidal Thoughts:  No  Homicidal Thoughts:  No  Memory:  Immediate;   Good Recent;   Good  Judgement:  Good  Insight:  Good  Psychomotor Activity:  Normal  Concentration:  Concentration: Good and Attention Span: Good  Recall:  Good  Fund of Knowledge: Good  Language: Good  Akathisia:  Negative  Handed:  Right  AIMS (if indicated): not done  Assets:  Communication Skills Desire for Improvement Housing Physical Health Resilience Social Support  ADL's:  Intact  Cognition: WNL  Sleep:  Good   Screenings: GAD-7    Flowsheet Row Office Visit from 06/02/2022 in [facility]  Total GAD-7 Score 18      PHQ2-9    Flowsheet Row Office Visit from 06/02/2022 in [facility]  PHQ-2 Total Score 6  PHQ-9 Total Score 23      Flowsheet Row ED from 12/10/2023 in [facility] Emergency Department at [facility] ED from 10[facility]23/2023 in [facility] Emergency Department at [facility] ED from 10/14/2023 in [facility] Emergency Department at [facility]  C-SSRS RISK CATEGORY No Risk No Risk No Risk        Assessment and Plan:  Maxwell Faulkner is a 38 yr old male who presents for Follow Up and Medication Management.  PPHx is significant for Depression and Anxiety, and no Suicide Attempts, Self Injurious Behavior, or Hospitalizations.   Abbie continues to have significant issues with seizures but his mood has remained overall stable.  Therefore we will not make any changes to his medications at this time.  Refills were sent in.  He will return for follow-up in approximately 2 to 3 months.  He did have elevated blood pressure.  Encouraged him to establish with a PCP as he does not have 1.  He was provided a list with [facility] community care clinics to obtain an appointment.   MDD, Recur[place], Moderate  GAD: -Continue Zoloft to 50 mg daily.  30 tablets with 2 refills. -Continue Seroquel 50 mg QHS for Augmentation, sleep, and appetite.  90 tablets with 0 refills.     Insomnia: -Continue Seroquel 50 mg QHS for Augmentation, sleep, and appetite.  90 tablets with 0 refills.   Seizures: -Depakote 1000 mg BID prescribed by Neurology   Collaboration of Care:   Patient/Guardian was advised Release of Information must be obtained prior to any record release in order to collaborate their care with an outside provider. Patient/Guardian was advised if they have not already done so to contact the registration department to sign all necessary forms in order for [place] to release information regarding their care.   Consent: Patient/Guardian gives verbal consent for treatment and assignment of benefits for services provided during this visit. Patient/Guardian expressed understanding and agreed to proceed.    Lauro Franklin, MD 12/14/2023, 8:43 AM

## 2023-12-25 ENCOUNTER — Other Ambulatory Visit: Payer: Self-pay | Admitting: Neurology

## 2023-12-26 ENCOUNTER — Other Ambulatory Visit: Payer: Self-pay

## 2023-12-26 MED ORDER — DIVALPROEX SODIUM 500 MG PO DR TAB: 1000.0000 mg | DELAYED_RELEASE_TABLET | Freq: Two times a day (BID) | ORAL | 0 refills | Status: DC

## 2024-01-11 ENCOUNTER — Ambulatory Visit: Admitting: Family Medicine

## 2024-01-11 ENCOUNTER — Encounter: Payer: Self-pay | Admitting: Family Medicine

## 2024-01-11 VITALS — BP 122/84 | HR 72 | Temp 97.8°F | Ht 66.0 in | Wt 117.0 lb

## 2024-01-11 DIAGNOSIS — K625 Hemorrhage of anus and rectum: Secondary | ICD-10-CM | POA: Diagnosis not present

## 2024-01-11 DIAGNOSIS — D649 Anemia, unspecified: Secondary | ICD-10-CM

## 2024-01-11 DIAGNOSIS — I700 Atherosclerosis of aorta: Secondary | ICD-10-CM

## 2024-01-11 DIAGNOSIS — Z1159 Encounter for screening for other viral diseases: Secondary | ICD-10-CM

## 2024-01-11 DIAGNOSIS — Z8719 Personal history of other diseases of the digestive system: Secondary | ICD-10-CM

## 2024-01-11 DIAGNOSIS — R10811 Right upper quadrant abdominal tenderness: Secondary | ICD-10-CM

## 2024-01-11 DIAGNOSIS — R932 Abnormal findings on diagnostic imaging of liver and biliary tract: Secondary | ICD-10-CM | POA: Diagnosis not present

## 2024-01-11 DIAGNOSIS — J439 Emphysema, unspecified: Secondary | ICD-10-CM | POA: Insufficient documentation

## 2024-01-11 DIAGNOSIS — R1084 Generalized abdominal pain: Secondary | ICD-10-CM

## 2024-01-11 DIAGNOSIS — R10813 Right lower quadrant abdominal tenderness: Secondary | ICD-10-CM | POA: Diagnosis not present

## 2024-01-11 HISTORY — DX: Atherosclerosis of aorta: I70.0

## 2024-01-11 HISTORY — DX: Emphysema, unspecified: J43.9

## 2024-01-11 HISTORY — DX: Personal history of other diseases of the digestive system: Z87.19

## 2024-01-11 LAB — LIPID PANEL
Cholesterol: 139 mg/dL (ref 0–200)
HDL: 33.5 mg/dL — ABNORMAL LOW (ref >39.00)
LDL Cholesterol: 84 mg/dL (ref 0–99)
NonHDL: 105.57
Total CHOL/HDL Ratio: 4
Triglycerides: 110 mg/dL (ref 0.0–149.0)
VLDL: 22 mg/dL (ref 0.0–40.0)

## 2024-01-11 LAB — COMPREHENSIVE METABOLIC PANEL
ALT: 7 U/L (ref 0–53)
AST: 15 U/L (ref 0–37)
Albumin: 4.2 g/dL (ref 3.5–5.2)
Alkaline Phosphatase: 59 U/L (ref 39–117)
BUN: 15 mg/dL (ref 6–23)
CO2: 30 meq/L (ref 19–32)
Calcium: 9.2 mg/dL (ref 8.4–10.5)
Chloride: 106 meq/L (ref 96–112)
Creatinine, Ser: 0.88 mg/dL (ref 0.40–1.50)
GFR: 108.56 mL/min (ref >60.00)
Glucose, Bld: 99 mg/dL (ref 70–99)
Potassium: 4.2 meq/L (ref 3.5–5.1)
Sodium: 143 meq/L (ref 135–145)
Total Bilirubin: 0.3 mg/dL (ref 0.2–1.2)
Total Protein: 6.8 g/dL (ref 6.0–8.3)

## 2024-01-11 LAB — CBC WITH DIFFERENTIAL/PLATELET
Basophils Absolute: 0 10*3/uL (ref 0.0–0.1)
Basophils Relative: 0.5 % (ref 0.0–3.0)
Eosinophils Absolute: 0.2 10*3/uL (ref 0.0–0.7)
Eosinophils Relative: 3.5 % (ref 0.0–5.0)
HCT: 37.6 % — ABNORMAL LOW (ref 39.0–52.0)
Hemoglobin: 12.5 g/dL — ABNORMAL LOW (ref 13.0–17.0)
Lymphocytes Relative: 56.8 % — ABNORMAL HIGH (ref 12.0–46.0)
Lymphs Abs: 3.8 10*3/uL (ref 0.7–4.0)
MCHC: 33.2 g/dL (ref 30.0–36.0)
MCV: 99.1 fl (ref 78.0–100.0)
Monocytes Absolute: 0.4 10*3/uL (ref 0.1–1.0)
Monocytes Relative: 6.7 % (ref 3.0–12.0)
Neutro Abs: 2.2 10*3/uL (ref 1.4–7.7)
Neutrophils Relative %: 32.5 % — ABNORMAL LOW (ref 43.0–77.0)
Platelets: 168 10*3/uL (ref 150.0–400.0)
RBC: 3.79 Mil/uL — ABNORMAL LOW (ref 4.22–5.81)
RDW: 14.2 % (ref 11.5–15.5)
WBC: 6.7 10*3/uL (ref 4.0–10.5)

## 2024-01-11 LAB — FOLATE: Folate: 5.8 ng/mL — ABNORMAL LOW (ref >5.9)

## 2024-01-11 LAB — FERRITIN: Ferritin: 82.9 ng/mL (ref 22.0–322.0)

## 2024-01-11 LAB — VITAMIN B12: Vitamin B-12: 690 pg/mL (ref 211–911)

## 2024-01-11 NOTE — Patient Instructions (Addendum)
 Please go downstairs for labs before you leave.  I am referring you to [facility] gastroenterology and they will call you to schedule a visit.  If your abdominal pain gets worse or if you see more blood, have vomiting or any other concerns then you will need to go to the emergency department.  You have evidence of emphysema which is chronic obstructive pulmonary disease due to smoking.  I recommend that you stop smoking.  You also have evidence of aortic atherosclerosis which is plaque buildup in your arteries.  I recommend that you cut back on foods high in fat, fried foods and animal products in general.  Smoking can also cause this.

## 2024-01-11 NOTE — Progress Notes (Addendum)
 New Patient Office Visit  Subjective    Patient ID: Maxwell Faulkner, male    DOB: December 28, 1985  Age: 38 y.o. MRN: [id number]  CC:  Chief Complaint  Patient presents with   Establish Care    Having high BP, has seizures as well. 185/165 a couple days ago. Sees mental health worker    HPI Maxwell Faulkner presents to establish care  Other providers:  Neurologist Be[facility]    Anemia- reports BRBPR x 2 months. States the amount of blood is "a lot" Pain with bowel movements.   Abdominal pain - in the mornings x 2 months  Denies heartburn   Denies family hx of colon cancer or IBD  Depression   Denies alcohol use.  Smokes marijuana and tobacco    Outpatient Encounter Medications as of 01/11/2024  Medication Sig   divalproex (DEPAKOTE) 500 MG DR tablet Take 2 tablets (1,000 mg total) by mouth 2 (two) times daily.   QUEtiapine (SEROQUEL) 50 MG tablet Take 1 tablet (50 mg total) by mouth at bedtime.   sertraline (ZOLOFT) 50 MG tablet Take 1 tablet (50 mg total) by mouth daily.   [DISCONTINUED] celecoxib (CELEBREX) 200 MG capsule Take 1 capsule (200 mg total) by mouth 2 (two) times daily.   [DISCONTINUED] cloBAZam (ONFI) 10 MG tablet Take 1 tablet (10 mg total) by mouth at bedtime.   [DISCONTINUED] cyclobenzaprine (FLEXERIL) 10 MG tablet Take 1 tablet (10 mg total) by mouth 2 (two) times daily as needed for muscle spasms.   [DISCONTINUED] diazePAM, 15 MG Dose, (VALTOCO 15 MG DOSE) 2 x 7.5 MG/0.1ML LQPK Place 15 mg into the nose as needed (For seizure lasting more than 5 minutes).   [DISCONTINUED] doxycycline (VIBRAMYCIN) 100 MG capsule Take 1 capsule (100 mg total) by mouth 2 (two) times daily.   [DISCONTINUED] ibuprofen (ADVIL) 600 MG tablet Take 1 tablet (600 mg total) by mouth every 6 (six) hours as needed.   [DISCONTINUED] methocarbamol (ROBAXIN) 500 MG tablet Take 1 tablet (500 mg total) by mouth 3 (three) times daily as needed for muscle spasms.   [DISCONTINUED]  ondansetron (ZOFRAN) 4 MG tablet Take 1 tablet (4 mg total) by mouth every 6 (six) hours.   [DISCONTINUED] oxyCODONE-acetaminophen (PERCOCET/ROXICET) 5-325 MG tablet Take 1 tablet by mouth every 6 (six) hours as needed for severe pain (pain score 7-10).   No facility-administered encounter medications on file as of 01/11/2024.    Past Medical History:  Diagnosis Date   Anxiety    Depression    Hypertension    Seizures (HCC)     Past Surgical History:  Procedure Laterality Date   WISDOM TOOTH EXTRACTION      Family History  Problem Relation Age of Onset   Hypertension Mother     Social History   Socioeconomic History   Marital status: Significant Other    Spouse name: sierra whitsett   Number of children: 3   Years of education: Not on file   Highest education level: Not on file  Occupational History   Not on file  Tobacco Use   Smoking status: Every Day    Current packs/day: 1.00    Types: Cigarettes   Smokeless tobacco: Never  Vaping Use   Vaping status: Never Used  Substance and Sexual Activity   Alcohol use: Not Currently    Comment: stopped drinking   Drug use: Yes    Types: Marijuana   Sexual activity: Yes    Birth control/protection: None  Other Topics Concern   Not on file  Social History Narrative   Right handed   Caffeine 1 cup daily   Social Drivers of Health   Financial Resource Strain: Not on file  Food Insecurity: Not on file  Transportation Needs: No Transportation Needs (01/05/2023)   PRAPARE - Administrator, Civil Service (Medical): No    Lack of Transportation (Non-Medical): No  Physical Activity: Not on file  Stress: Not on file  Social Connections: Not on file  Intimate Partner Violence: Not on file    Review of Systems  Constitutional:  Negative for chills, fever, malaise/fatigue and weight loss.  Respiratory:  Negative for shortness of breath.   Cardiovascular:  Negative for chest pain, palpitations and leg swelling.   Gastrointestinal:  Positive for abdominal pain and blood in stool. Negative for constipation, diarrhea, nausea and vomiting.  Genitourinary:  Negative for dysuria, frequency and urgency.  Neurological:  Negative for dizziness, focal weakness and headaches.        Objective    BP 122/84 (BP Location: Left Arm, Patient Position: Sitting, Cuff Size: Normal)   Pulse 72   Temp 97.8 F (36.6 C) (Temporal)   Ht 5\' 6"  (1.676 m)   Wt 117 lb (53.1 kg)   SpO2 99%   BMI 18.88 kg/m   Physical Exam Constitutional:      General: He is not in acute distress.    Appearance: He is not ill-appearing.  HENT:     Mouth/Throat:     Mouth: Mucous membranes are moist.  Eyes:     Extraocular Movements: Extraocular movements intact.     Conjunctiva/sclera: Conjunctivae normal.  Cardiovascular:     Rate and Rhythm: Normal rate and regular rhythm.  Pulmonary:     Effort: Pulmonary effort is normal.     Breath sounds: Normal breath sounds.  Abdominal:     General: Abdomen is flat. Bowel sounds are normal.     Palpations: Abdomen is soft.     Tenderness: There is abdominal tenderness in the right upper quadrant and right lower quadrant. There is no guarding or rebound. Negative signs include Murphy's sign, Rovsing's sign and McBurney's sign.  Musculoskeletal:     Cervical back: Normal range of motion and neck supple.     Right lower leg: No edema.     Left lower leg: No edema.  Skin:    General: Skin is warm and dry.  Neurological:     General: No focal deficit present.     Mental Status: He is alert and oriented to person, place, and time.     Motor: No weakness.     Coordination: Coordination normal.     Gait: Gait normal.  Psychiatric:        Mood and Affect: Mood normal.        Behavior: Behavior normal.        Thought Content: Thought content normal.         Assessment & Plan:   Problem List Items Addressed This Visit     Abnormal CT scan, gallbladder   Relevant Orders    Ambulatory referral to Gastroenterology   Anemia   Relevant Orders   CBC with Differential/Platelet (Completed)   Comprehensive metabolic panel (Completed)   Ferritin (Completed)   Folate (Completed)   Vitamin B12 (Completed)   Ambulatory referral to Gastroenterology   Aortic atherosclerosis (HCC)   Relevant Orders   Lipid panel (Completed)   BRBPR (bright red blood  per rectum) - Primary   Relevant Orders   CBC with Differential/Platelet (Completed)   Comprehensive metabolic panel (Completed)   Ambulatory referral to Gastroenterology   Generalized abdominal pain   Relevant Orders   CBC with Differential/Platelet (Completed)   Comprehensive metabolic panel (Completed)   History of colitis   Relevant Orders   Ambulatory referral to Gastroenterology   Pulmonary emphysema (HCC)   Right lower quadrant abdominal tenderness without rebound tenderness   Relevant Orders   CBC with Differential/Platelet (Completed)   Comprehensive metabolic panel (Completed)   Right upper quadrant abdominal tenderness   Relevant Orders   Ambulatory referral to Gastroenterology   Other Visit Diagnoses       Encounter for screening for other viral diseases       Relevant Orders   Hepatitis C antibody (Completed)   HIV Antibody (routine testing w rflx) (Completed)      He is a pleasant 38 year old male who is new to the practice and here to establish care.  Reviewed notes from ED as well as abnormal CT and lab results.  Possible colitis and gallbladder wall thickening seen on CT abdomen pelvis October 14, 2023.  Continues having BRB PR with bowel movements.  No acute distress today.  He will continue seeing neurology and behavioral health.  In-depth counseling on smoking cessation and marijuana cessation in order to improve overall health.  We discussed abnormal findings on his head CT including pulmonary emphysema which he was not aware of. Referral to GI for further workup.  Advised him to go to the  emergency department if he has any new or worsening symptoms Check labs and follow-up.  He will follow-up with me in 2 weeks   Return in about 2 weeks (around 01/25/2024).   Hetty Blend, NP-C

## 2024-01-12 LAB — HIV ANTIBODY (ROUTINE TESTING W REFLEX): HIV 1&2 Ab, 4th Generation: NONREACTIVE

## 2024-01-12 LAB — HEPATITIS C ANTIBODY: Hepatitis C Ab: NONREACTIVE

## 2024-01-13 ENCOUNTER — Encounter: Payer: Self-pay | Admitting: Family Medicine

## 2024-01-14 ENCOUNTER — Encounter: Payer: Self-pay | Admitting: Nurse Practitioner

## 2024-01-15 ENCOUNTER — Other Ambulatory Visit: Payer: Self-pay

## 2024-01-15 ENCOUNTER — Encounter (HOSPITAL_COMMUNITY): Payer: Self-pay | Admitting: Emergency Medicine

## 2024-01-15 ENCOUNTER — Emergency Department (HOSPITAL_COMMUNITY)
Admission: EM | Admit: 2024-01-15 | Discharge: 2024-01-16 | Discharge status: Against Medical Advice | Attending: Emergency Medicine | Admitting: Emergency Medicine

## 2024-01-15 DIAGNOSIS — R109 Unspecified abdominal pain: Secondary | ICD-10-CM | POA: Diagnosis not present

## 2024-01-15 DIAGNOSIS — Z5321 Procedure and treatment not carried out due to patient leaving prior to being seen by health care provider: Secondary | ICD-10-CM | POA: Insufficient documentation

## 2024-01-15 DIAGNOSIS — R112 Nausea with vomiting, unspecified: Secondary | ICD-10-CM | POA: Insufficient documentation

## 2024-01-15 LAB — COMPREHENSIVE METABOLIC PANEL
ALT: 12 U/L (ref 0–44)
AST: 21 U/L (ref 15–41)
Albumin: 3.6 g/dL (ref 3.5–5.0)
Alkaline Phosphatase: 45 U/L (ref 38–126)
Anion gap: 8 (ref 5–15)
BUN: 11 mg/dL (ref 6–20)
CO2: 26 mmol/L (ref 22–32)
Calcium: 8.9 mg/dL (ref 8.9–10.3)
Chloride: 104 mmol/L (ref 98–111)
Creatinine, Ser: 0.95 mg/dL (ref 0.61–1.24)
GFR, Estimated: >60 mL/min (ref >60)
Glucose, Bld: 113 mg/dL — ABNORMAL HIGH (ref 70–99)
Potassium: 3.2 mmol/L — ABNORMAL LOW (ref 3.5–5.1)
Sodium: 138 mmol/L (ref 135–145)
Total Bilirubin: 0.7 mg/dL (ref 0.0–1.2)
Total Protein: 6.6 g/dL (ref 6.5–8.1)

## 2024-01-15 LAB — CBC WITH DIFFERENTIAL/PLATELET
Abs Immature Granulocytes: 0.06 10*3/uL (ref 0.00–0.07)
Basophils Absolute: 0.1 10*3/uL (ref 0.0–0.1)
Basophils Relative: 0 %
Eosinophils Absolute: 0.2 10*3/uL (ref 0.0–0.5)
Eosinophils Relative: 1 %
HCT: 37.2 % — ABNORMAL LOW (ref 39.0–52.0)
Hemoglobin: 12.4 g/dL — ABNORMAL LOW (ref 13.0–17.0)
Immature Granulocytes: 0 %
Lymphocytes Relative: 39 %
Lymphs Abs: 6.7 10*3/uL — ABNORMAL HIGH (ref 0.7–4.0)
MCH: 32.5 pg (ref 26.0–34.0)
MCHC: 33.3 g/dL (ref 30.0–36.0)
MCV: 97.6 fL (ref 80.0–100.0)
Monocytes Absolute: 1.3 10*3/uL — ABNORMAL HIGH (ref 0.1–1.0)
Monocytes Relative: 7 %
Neutro Abs: 8.8 10*3/uL — ABNORMAL HIGH (ref 1.7–7.7)
Neutrophils Relative %: 53 %
Platelets: 202 10*3/uL (ref 150–400)
RBC: 3.81 MIL/uL — ABNORMAL LOW (ref 4.22–5.81)
RDW: 13.8 % (ref 11.5–15.5)
WBC: 17.2 10*3/uL — ABNORMAL HIGH (ref 4.0–10.5)
nRBC: 0 % (ref 0.0–0.2)

## 2024-01-15 LAB — LIPASE, BLOOD: Lipase: 282 U/L — ABNORMAL HIGH (ref 11–51)

## 2024-01-15 MED ORDER — ONDANSETRON 4 MG PO TBDP
4.0000 mg | ORAL_TABLET | Freq: Once | ORAL | Status: AC
  Administered 2024-01-15: 4 mg via ORAL
  Filled 2024-01-15: qty 1

## 2024-01-15 NOTE — ED Triage Notes (Signed)
 Pt c/o abdominal pain with N/V x 1 hour. Was seen at his [facility] on 3/14 for the same. States that he feels like he is going to have a seizure, sates that he took his seizure medication this AM but not this afternoon.

## 2024-01-15 NOTE — ED Provider Triage Note (Signed)
 Emergency Medicine Provider Triage Evaluation Note  Maxwell Faulkner , a 38 y.o. male  was evaluated in triage.  Pt complains of Abd pain, N/V  Review of Systems  Positive: N/V, abd pain Negative: Fever, chills, diarrhea, SHOB, CP  Physical Exam  There were no vitals taken for this visit. Gen:   Awake, no distress   Resp:  Normal effort  MSK:   Moves extremities without difficulty  Other:    Medical Decision Making  Medically screening exam initiated at 8:01 PM.  Appropriate orders placed.  Maxwell Faulkner was informed that the remainder of the evaluation will be completed by another provider, this initial triage assessment does not replace that evaluation, and the importance of remaining in the ED until their evaluation is complete.  Labs ordered   Maxwell Faulkner, Maxwell Faulkner 01/15/24 2006

## 2024-01-16 ENCOUNTER — Emergency Department (HOSPITAL_COMMUNITY)
Admission: EM | Admit: 2024-01-16 | Discharge: 2024-01-16 | Disposition: A | Discharge status: Home / Self Care | Attending: Emergency Medicine | Admitting: Emergency Medicine

## 2024-01-16 ENCOUNTER — Emergency Department (HOSPITAL_COMMUNITY)

## 2024-01-16 ENCOUNTER — Encounter (HOSPITAL_COMMUNITY): Payer: Self-pay

## 2024-01-16 DIAGNOSIS — R569 Unspecified convulsions: Secondary | ICD-10-CM | POA: Insufficient documentation

## 2024-01-16 DIAGNOSIS — R109 Unspecified abdominal pain: Secondary | ICD-10-CM | POA: Diagnosis present

## 2024-01-16 DIAGNOSIS — K859 Acute pancreatitis without necrosis or infection, unspecified: Secondary | ICD-10-CM | POA: Diagnosis not present

## 2024-01-16 DIAGNOSIS — R188 Other ascites: Secondary | ICD-10-CM | POA: Diagnosis not present

## 2024-01-16 DIAGNOSIS — I10 Essential (primary) hypertension: Secondary | ICD-10-CM | POA: Diagnosis not present

## 2024-01-16 DIAGNOSIS — K8591 Acute pancreatitis with uninfected necrosis, unspecified: Secondary | ICD-10-CM | POA: Diagnosis not present

## 2024-01-16 DIAGNOSIS — E876 Hypokalemia: Secondary | ICD-10-CM | POA: Insufficient documentation

## 2024-01-16 DIAGNOSIS — K5900 Constipation, unspecified: Secondary | ICD-10-CM | POA: Diagnosis not present

## 2024-01-16 DIAGNOSIS — G40909 Epilepsy, unspecified, not intractable, without status epilepticus: Secondary | ICD-10-CM

## 2024-01-16 LAB — URINALYSIS, ROUTINE W REFLEX MICROSCOPIC
Bacteria, UA: NONE SEEN
Bilirubin Urine: NEGATIVE
Glucose, UA: NEGATIVE mg/dL
Hgb urine dipstick: NEGATIVE
Ketones, ur: NEGATIVE mg/dL
Leukocytes,Ua: NEGATIVE
Nitrite: NEGATIVE
Protein, ur: 30 mg/dL — AB
Specific Gravity, Urine: 1.024 (ref 1.005–1.030)
pH: 6 (ref 5.0–8.0)

## 2024-01-16 LAB — VALPROIC ACID LEVEL: Valproic Acid Lvl: 65 ug/mL (ref 50.0–100.0)

## 2024-01-16 LAB — RAPID URINE DRUG SCREEN, HOSP PERFORMED
Amphetamines: NOT DETECTED
Barbiturates: NOT DETECTED
Benzodiazepines: POSITIVE — AB
Cocaine: NOT DETECTED
Opiates: POSITIVE — AB
Tetrahydrocannabinol: POSITIVE — AB

## 2024-01-16 LAB — CBG MONITORING, ED: Glucose-Capillary: 121 mg/dL — ABNORMAL HIGH (ref 70–99)

## 2024-01-16 LAB — ETHANOL: Alcohol, Ethyl (B): <10 mg/dL (ref <10)

## 2024-01-16 MED ORDER — LORAZEPAM 2 MG/ML IJ SOLN: 2.0000 mg | INTRAMUSCULAR | Status: DC | PRN

## 2024-01-16 MED ORDER — IOHEXOL 300 MG/ML  SOLN
100.0000 mL | Freq: Once | INTRAMUSCULAR | Status: AC | PRN
  Administered 2024-01-16: 100 mL via INTRAVENOUS

## 2024-01-16 MED ORDER — OXYCODONE-ACETAMINOPHEN 5-325 MG PO TABS: 1.0000 | ORAL_TABLET | Freq: Four times a day (QID) | ORAL | 0 refills | Status: DC | PRN

## 2024-01-16 MED ORDER — HYDROMORPHONE HCL 1 MG/ML IJ SOLN
0.5000 mg | Freq: Once | INTRAMUSCULAR | Status: AC
  Administered 2024-01-16: 0.5 mg via INTRAVENOUS
  Filled 2024-01-16: qty 1

## 2024-01-16 MED ORDER — HYDROMORPHONE HCL 1 MG/ML IJ SOLN
1.0000 mg | Freq: Once | INTRAMUSCULAR | Status: AC
  Administered 2024-01-16: 1 mg via INTRAVENOUS
  Filled 2024-01-16: qty 1

## 2024-01-16 MED ORDER — ONDANSETRON HCL 4 MG/2ML IJ SOLN
4.0000 mg | Freq: Once | INTRAMUSCULAR | Status: AC
  Administered 2024-01-16: 4 mg via INTRAVENOUS
  Filled 2024-01-16: qty 2

## 2024-01-16 MED ORDER — ONDANSETRON 4 MG PO TBDP: 4.0000 mg | ORAL_TABLET | Freq: Three times a day (TID) | ORAL | 0 refills | Status: DC | PRN

## 2024-01-16 MED ORDER — SODIUM CHLORIDE 0.9 % IV BOLUS
1000.0000 mL | Freq: Once | INTRAVENOUS | Status: AC
  Administered 2024-01-16: 1000 mL via INTRAVENOUS

## 2024-01-16 MED ORDER — DIVALPROEX SODIUM 500 MG PO DR TAB
1000.0000 mg | DELAYED_RELEASE_TABLET | Freq: Once | ORAL | Status: AC
  Administered 2024-01-16: 1000 mg via ORAL
  Filled 2024-01-16: qty 2

## 2024-01-16 NOTE — ED Provider Notes (Addendum)
 [place] EMERGENCY DEPARTMENT AT [facility] Provider Note   CSN: [id number] Arrival date & time: 01/16/24  6962     History Chief Complaint  Patient presents with   Seizures   Abdominal Pain    Maxwell Faulkner is a 38 y.o. male.  Patient past history significant for seizures, anxiety, hypertension presents to the emergency department today with concerns of abdominal pain, nausea, vomiting.  States that his abdominal pain began around 9 PM last night with multiple episodes of vomiting.  Unclear cause.  Denies any alcohol or substance use recently.  He does also report some concerns for possible seizures and states that he had a seizure about 30 minutes prior to arriving.  States that he currently takes Depakote and has not missed any medications but does report that he missed a dose last night due to the vomiting.   Seizures Abdominal Pain      Home Medications Prior to Admission medications   Medication Sig Start Date End Date Taking? Authorizing Provider  ondansetron (ZOFRAN-ODT) 4 MG disintegrating tablet Take 1 tablet (4 mg total) by mouth every 8 (eight) hours as needed for nausea or vomiting. 01/16/24  Yes Smitty Knudsen, PA-C  oxyCODONE-acetaminophen (PERCOCET/ROXICET) 5-325 MG tablet Take 1 tablet by mouth every 6 (six) hours as needed for severe pain (pain score 7-10). 01/16/24  Yes Cardama, Amadeo Garnet, MD  divalproex (DEPAKOTE) 500 MG DR tablet Take 2 tablets (1,000 mg total) by mouth 2 (two) times daily. 12/26/23   Dohmeier, Porfirio Mylar, MD  QUEtiapine (SEROQUEL) 50 MG tablet Take 1 tablet (50 mg total) by mouth at bedtime. 12/14/23   Lauro Franklin, MD  sertraline (ZOLOFT) 50 MG tablet Take 1 tablet (50 mg total) by mouth daily. 12/14/23   Lauro Franklin, MD      Allergies    Patient has no known allergies.    Review of Systems   Review of Systems  Gastrointestinal:  Positive for abdominal pain.  Neurological:  Positive for seizures.  All other  systems reviewed and are negative.   Physical Exam Updated Vital Signs BP (!) 130/95 (BP Location: Left Arm)   Pulse 90   Temp 97.6 F (36.4 C) (Oral)   Resp (!) 22   SpO2 96%  Physical Exam Vitals and nursing note reviewed.  Constitutional:      General: He is not in acute distress.    Appearance: He is well-developed. He is not ill-appearing.  HENT:     Head: Normocephalic and atraumatic.  Eyes:     Conjunctiva/sclera: Conjunctivae normal.  Cardiovascular:     Rate and Rhythm: Normal rate and regular rhythm.     Heart sounds: No murmur heard. Pulmonary:     Effort: Pulmonary effort is normal. No respiratory distress.     Breath sounds: Normal breath sounds.  Abdominal:     Palpations: Abdomen is soft.     Tenderness: There is abdominal tenderness in the epigastric area and left upper quadrant. There is guarding.  Musculoskeletal:        General: No swelling.     Cervical back: Neck supple.  Skin:    General: Skin is warm and dry.     Capillary Refill: Capillary refill takes less than 2 seconds.  Neurological:     Mental Status: He is alert.  Psychiatric:        Mood and Affect: Mood normal.     ED Results / Procedures / Treatments   Labs (  all labs ordered are listed, but only abnormal results are displayed) Labs Reviewed  URINALYSIS, ROUTINE W REFLEX MICROSCOPIC - Abnormal; Notable for the following components:      Result Value   Protein, ur 30 (*)    All other components within normal limits  RAPID URINE DRUG SCREEN, HOSP PERFORMED - Abnormal; Notable for the following components:   Opiates POSITIVE (*)    Benzodiazepines POSITIVE (*)    Tetrahydrocannabinol POSITIVE (*)    All other components within normal limits  CBG MONITORING, ED - Abnormal; Notable for the following components:   Glucose-Capillary 121 (*)    All other components within normal limits  VALPROIC ACID LEVEL  ETHANOL    EKG None  Radiology CT ABDOMEN PELVIS W CONTRAST Result  Date: 01/16/2024 CLINICAL DATA:  38 year old male with abdominal pain.  Pancreatitis. EXAM: CT ABDOMEN AND PELVIS WITH CONTRAST TECHNIQUE: Multidetector CT imaging of the abdomen and pelvis was performed using the standard protocol following bolus administration of intravenous contrast. RADIATION DOSE REDUCTION: This exam was performed according to the departmental dose-optimization program which includes automated exposure control, adjustment of the mA and/or kV according to patient size and/or use of iterative reconstruction technique. CONTRAST:  OMNIPAQUE IOHEXOL 300 MG/ML  SOLN COMPARISON:  Pelvis CT 12/11/2023. CT Abdomen and Pelvis 10/14/2023. FINDINGS: Lower chest: Lower lung volumes with dependent lung base atelectasis. No pericardial or pleural effusion. Hepatobiliary: New free fluid in the abdomen since December, simple fluid density. Mostly this is medial and inferior to the liver. Liver enhancement, gallbladder are within normal limits. Pancreas: Enlarged, inflamed, heterogeneously enhancing throughout. Abundant edema in the lesser sac and abundant regional surrounding upper abdominal confluent edema or low-density ascites. No discrete pancreatic necrosis. No discrete mass or ductal dilatation. No organized fluid collection. Spleen: Adjacent free fluid but otherwise negative. Adrenals/Urinary Tract: Anterior pararenal space ascites or edema, but otherwise negative adrenal glands. Small benign appearing renal cysts, the largest in the left lower pole has simple fluid density. (No follow up imaging recommended). Punctate left midpole nephrolithiasis. No hydronephrosis. No evidence of hydroureter. No delayed excretory images. Stomach/Bowel: Nondilated large and small bowel throughout the abdomen and pelvis with superimposed ascites, most abundant in the upper abdomen but also layering in the left pericolic gutter (series 2, image 46). Retained stool in the transverse colon. No pneumoperitoneum  identified. Cecum partially located in the pelvis. Appendix not well delineated. Retained fluid in the proximal stomach. Distal stomach and duodenum are mostly decompressed. Vascular/Lymphatic: Major arterial structures and the portal venous system including the splenic vein remain patent. Mild soft atherosclerotic plaque of the distal abdominal aorta suspected and stable. Superimposed iliac artery combined soft and calcified plaque. No lymphadenopathy identified. Reproductive: Negative. Other: Small volume of free fluid layering in the pelvis with simple fluid density on series 2, image 67. Musculoskeletal: Negative. IMPRESSION: 1. Acute Pancreatitis with generalized inflamed pancreas. Confluent edema and Ascites throughout the abdomen, with free fluid layering in the pelvis and paracolic gutters. No pancreatic necrosis, organized fluid collection, or other complicating features at this time. 2. No other acute or inflammatory process identified in the abdomen or pelvis. 3. Mild but advanced for age Aortoiliac calcified atherosclerosis. Punctate nephrolithiasis. Electronically Signed   By: Odessa Fleming M.D.   On: 01/16/2024 05:18    Procedures Procedures    Medications Ordered in ED Medications  LORazepam (ATIVAN) injection 2 mg (has no administration in time range)  sodium chloride 0.9 % bolus 1,000 mL (0 mLs Intravenous  Stopped 01/16/24 0541)  ondansetron (ZOFRAN) injection 4 mg (4 mg Intravenous Given 01/16/24 0353)  HYDROmorphone (DILAUDID) injection 1 mg (1 mg Intravenous Given 01/16/24 0410)  iohexol (OMNIPAQUE) 300 MG/ML solution 100 mL (100 mLs Intravenous Contrast Given 01/16/24 0447)  HYDROmorphone (DILAUDID) injection 0.5 mg (0.5 mg Intravenous Given 01/16/24 0541)  divalproex (DEPAKOTE) DR tablet 1,000 mg (1,000 mg Oral Given 01/16/24 0541)    ED Course/ Medical Decision Making/ A&P                                 Medical Decision Making Amount and/or Complexity of Data Reviewed Labs:  ordered. Radiology: ordered.  Risk Prescription drug management.   This patient presents to the ED for concern of abdominal pain, seizures.  Differential diagnosis includes pancreatitis, bowel obstruction, diverticulitis, UTI   Lab Tests:  I Ordered, and personally interpreted labs.  The pertinent results include: CBG close to normal 121, CBC from earlier shows leukocytosis at 17.2, stable hemoglobin 12.4, CMP shows mild hypokalemia 3.2 likely due to GI loss, lipase elevated at 282   Imaging Studies ordered:  I ordered imaging studies including CT abdomen pelvis  I independently visualized and interpreted imaging which showed 1. Acute Pancreatitis with generalized inflamed pancreas. Confluent edema and Ascites throughout the abdomen, with free fluid layering in the pelvis and paracolic gutters. No pancreatic necrosis, organized fluid collection, or other complicating features at this time. 2. No other acute or inflammatory process identified in the abdomen or pelvis. 3. Mild but advanced for age Aortoiliac calcified atherosclerosis. Punctate nephrolithiasis. I agree with the radiologist interpretation   Medicines ordered and prescription drug management:  I ordered medication including fluids, Zofran, Dilaudid for pain  Reevaluation of the patient after these medicines showed that the patient improved I have reviewed the patients home medicines and have made adjustments as needed   Problem List / ED Course:  Patient with past history significant for seizures, anxiety, hypertension presents ED today with concerns of abdominal pain.  Reports that he began experiencing different abdominal pain, nausea, vomiting around 9 PM yesterday evening.  Initially presented to [facility] emergency department but left before being seen.  He now presents here with concern that he may have had a seizure about 30 minutes prior to arriving.  Still endorsing epigastric tenderness.  Unable to tolerate p.o.   No recent fever chills or bodyaches. On exam, patient has significant epigastric and left upper quadrant tenderness.  There is notable guarding but no appreciable mass.  Normal bowel sounds.  Patient is visibly uncomfortable establish a pain management.  Given most narcotic medications due to decreased seizure threshold, caution patient that this may be a risk factor but patient would prefer to proceed with pain control.  Basic labs ordered as well as CT imaging.  Patient labs performed earlier at [facility] which did reveal elevated lipase levels as well as leukocytosis.  Will add on further labs. Labs from earlier show leukocytosis at 17.2, lipase elevated at 282, mild hypokalemia at 3.2 likely due to vomiting.  After initial dose of Dilaudid and Zofran, patient reports some improvement.  Fluids initiated to replete given amount of vomiting that he is reporting. CT imaging is consistent with acute pancreatitis.  There is edema and ascites throughout the abdomen. Patient endorsing some increase in pain, will add on 0.5mg  Dilaudid for further pain management. On repeat assessment, pain appears improved and patient resting comfortably.  Tolerating PO. Will give dose of Depakote here and anticipate discharge if successful PO.  Patient tolerated PO.  No observed seizure activity while patient was here in the emergency department.  Discharging home at this time with prescription for Percocet and Zofran for continued symptom management at home. Advised strict return precautions. Patient otherwise stable and discharged home.  Final Clinical Impression(s) / ED Diagnoses Final diagnoses:  Acute pancreatitis without infection or necrosis, unspecified pancreatitis type  Hypokalemia  Seizure disorder [facility])    Rx / DC Orders ED Discharge Orders          Ordered    ondansetron (ZOFRAN-ODT) 4 MG disintegrating tablet  Every 8 hours PRN        01/16/24 0610    oxyCODONE-acetaminophen (PERCOCET/ROXICET) 5-325 MG  tablet  Every 6 hours PRN,   Status:  Discontinued        01/16/24 0618    oxyCODONE-acetaminophen (PERCOCET/ROXICET) 5-325 MG tablet  Every 6 hours PRN        01/16/24 0623              Smitty Knudsen, PA-C 01/16/24 0643    Smitty Knudsen, PA-C 01/16/24 0644    Nira Conn, MD 01/16/24 [phone]

## 2024-01-16 NOTE — Discharge Instructions (Addendum)
 You were seen in the ER today for concerns of abdominal pain. Your labs and imaging are consistent with signs of pancreatitis. You had improvement in your symptoms here in the ER. I have sent a few medications to your pharmacy for pain and nausea control. Please take these as prescribed. Hydration will be most important over the next few days to allow for your symptoms to improve. Please make sure to stay as hydrated as possible.Return to the ER for any concerns of new or worsening symptoms, otherwise follow up with your primary care provider.

## 2024-01-16 NOTE — ED Triage Notes (Addendum)
 Pt from home with s/o c/o seizure appx 30 mins ago, hx of same, on meds, missed last night dose of meds.   Pt reports abdominal pain x 2100 last night with vomiting, unsure of the cause.  Pt alert and able to answer all questions in triage.  [facility] to come here, see previous triage note

## 2024-01-16 NOTE — ED Notes (Signed)
 Pt called for vitals x2 with no answer

## 2024-01-17 ENCOUNTER — Encounter (HOSPITAL_COMMUNITY): Payer: Self-pay

## 2024-01-17 ENCOUNTER — Inpatient Hospital Stay (HOSPITAL_COMMUNITY)
Admission: EM | Admit: 2024-01-17 | Discharge: 2024-01-23 | DRG: 439 | Disposition: A | Discharge status: Home / Self Care | Attending: Family Medicine | Admitting: Family Medicine

## 2024-01-17 ENCOUNTER — Other Ambulatory Visit: Payer: Self-pay

## 2024-01-17 DIAGNOSIS — K8591 Acute pancreatitis with uninfected necrosis, unspecified: Secondary | ICD-10-CM | POA: Diagnosis not present

## 2024-01-17 DIAGNOSIS — R32 Unspecified urinary incontinence: Secondary | ICD-10-CM | POA: Diagnosis present

## 2024-01-17 DIAGNOSIS — E8809 Other disorders of plasma-protein metabolism, not elsewhere classified: Secondary | ICD-10-CM | POA: Diagnosis not present

## 2024-01-17 DIAGNOSIS — R1110 Vomiting, unspecified: Secondary | ICD-10-CM | POA: Diagnosis not present

## 2024-01-17 DIAGNOSIS — F1310 Sedative, hypnotic or anxiolytic abuse, uncomplicated: Secondary | ICD-10-CM | POA: Diagnosis present

## 2024-01-17 DIAGNOSIS — R748 Abnormal levels of other serum enzymes: Secondary | ICD-10-CM | POA: Diagnosis not present

## 2024-01-17 DIAGNOSIS — Z79899 Other long term (current) drug therapy: Secondary | ICD-10-CM

## 2024-01-17 DIAGNOSIS — F1910 Other psychoactive substance abuse, uncomplicated: Secondary | ICD-10-CM | POA: Diagnosis present

## 2024-01-17 DIAGNOSIS — E876 Hypokalemia: Secondary | ICD-10-CM | POA: Diagnosis present

## 2024-01-17 DIAGNOSIS — E162 Hypoglycemia, unspecified: Secondary | ICD-10-CM | POA: Diagnosis not present

## 2024-01-17 DIAGNOSIS — K859 Acute pancreatitis without necrosis or infection, unspecified: Secondary | ICD-10-CM | POA: Diagnosis not present

## 2024-01-17 DIAGNOSIS — K8500 Idiopathic acute pancreatitis without necrosis or infection: Secondary | ICD-10-CM | POA: Diagnosis not present

## 2024-01-17 DIAGNOSIS — R569 Unspecified convulsions: Secondary | ICD-10-CM | POA: Diagnosis not present

## 2024-01-17 DIAGNOSIS — R188 Other ascites: Secondary | ICD-10-CM | POA: Diagnosis not present

## 2024-01-17 DIAGNOSIS — R2681 Unsteadiness on feet: Secondary | ICD-10-CM | POA: Diagnosis present

## 2024-01-17 DIAGNOSIS — F419 Anxiety disorder, unspecified: Secondary | ICD-10-CM | POA: Diagnosis not present

## 2024-01-17 DIAGNOSIS — Z8249 Family history of ischemic heart disease and other diseases of the circulatory system: Secondary | ICD-10-CM

## 2024-01-17 DIAGNOSIS — D696 Thrombocytopenia, unspecified: Secondary | ICD-10-CM | POA: Diagnosis present

## 2024-01-17 DIAGNOSIS — F1210 Cannabis abuse, uncomplicated: Secondary | ICD-10-CM | POA: Diagnosis present

## 2024-01-17 DIAGNOSIS — F17210 Nicotine dependence, cigarettes, uncomplicated: Secondary | ICD-10-CM | POA: Diagnosis present

## 2024-01-17 DIAGNOSIS — D649 Anemia, unspecified: Secondary | ICD-10-CM | POA: Diagnosis not present

## 2024-01-17 DIAGNOSIS — G40901 Epilepsy, unspecified, not intractable, with status epilepticus: Secondary | ICD-10-CM | POA: Diagnosis not present

## 2024-01-17 DIAGNOSIS — R413 Other amnesia: Secondary | ICD-10-CM | POA: Diagnosis not present

## 2024-01-17 DIAGNOSIS — R109 Unspecified abdominal pain: Secondary | ICD-10-CM | POA: Diagnosis not present

## 2024-01-17 DIAGNOSIS — F32A Depression, unspecified: Secondary | ICD-10-CM | POA: Diagnosis present

## 2024-01-17 DIAGNOSIS — Z7151 Drug abuse counseling and surveillance of drug abuser: Secondary | ICD-10-CM | POA: Diagnosis not present

## 2024-01-17 DIAGNOSIS — F1110 Opioid abuse, uncomplicated: Secondary | ICD-10-CM | POA: Diagnosis present

## 2024-01-17 DIAGNOSIS — K853 Drug induced acute pancreatitis without necrosis or infection: Secondary | ICD-10-CM | POA: Diagnosis not present

## 2024-01-17 DIAGNOSIS — K5900 Constipation, unspecified: Secondary | ICD-10-CM | POA: Diagnosis not present

## 2024-01-17 DIAGNOSIS — I10 Essential (primary) hypertension: Secondary | ICD-10-CM | POA: Diagnosis present

## 2024-01-17 DIAGNOSIS — R531 Weakness: Secondary | ICD-10-CM

## 2024-01-17 DIAGNOSIS — G40909 Epilepsy, unspecified, not intractable, without status epilepticus: Secondary | ICD-10-CM | POA: Diagnosis not present

## 2024-01-17 HISTORY — DX: Acute pancreatitis without necrosis or infection, unspecified: K85.90

## 2024-01-17 LAB — COMPREHENSIVE METABOLIC PANEL
ALT: 11 U/L (ref 0–44)
AST: 22 U/L (ref 15–41)
Albumin: 3.5 g/dL (ref 3.5–5.0)
Alkaline Phosphatase: 55 U/L (ref 38–126)
Anion gap: 12 (ref 5–15)
BUN: 14 mg/dL (ref 6–20)
CO2: 26 mmol/L (ref 22–32)
Calcium: 9.2 mg/dL (ref 8.9–10.3)
Chloride: 97 mmol/L — ABNORMAL LOW (ref 98–111)
Creatinine, Ser: 0.69 mg/dL (ref 0.61–1.24)
GFR, Estimated: >60 mL/min (ref >60)
Glucose, Bld: 73 mg/dL (ref 70–99)
Potassium: 4.3 mmol/L (ref 3.5–5.1)
Sodium: 135 mmol/L (ref 135–145)
Total Bilirubin: 0.8 mg/dL (ref 0.0–1.2)
Total Protein: 7 g/dL (ref 6.5–8.1)

## 2024-01-17 LAB — URINALYSIS, ROUTINE W REFLEX MICROSCOPIC
Bacteria, UA: NONE SEEN
Bilirubin Urine: NEGATIVE
Glucose, UA: NEGATIVE mg/dL
Ketones, ur: 20 mg/dL — AB
Leukocytes,Ua: NEGATIVE
Nitrite: NEGATIVE
Protein, ur: 30 mg/dL — AB
Specific Gravity, Urine: 1.027 (ref 1.005–1.030)
pH: 5 (ref 5.0–8.0)

## 2024-01-17 LAB — CBC
HCT: 41.2 % (ref 39.0–52.0)
Hemoglobin: 13.7 g/dL (ref 13.0–17.0)
MCH: 32.5 pg (ref 26.0–34.0)
MCHC: 33.3 g/dL (ref 30.0–36.0)
MCV: 97.9 fL (ref 80.0–100.0)
Platelets: 137 10*3/uL — ABNORMAL LOW (ref 150–400)
RBC: 4.21 MIL/uL — ABNORMAL LOW (ref 4.22–5.81)
RDW: 13.6 % (ref 11.5–15.5)
WBC: 12.5 10*3/uL — ABNORMAL HIGH (ref 4.0–10.5)
nRBC: 0 % (ref 0.0–0.2)

## 2024-01-17 LAB — LIPASE, BLOOD: Lipase: 195 U/L — ABNORMAL HIGH (ref 11–51)

## 2024-01-17 MED ORDER — OXYCODONE HCL 5 MG PO TABS
5.0000 mg | ORAL_TABLET | ORAL | Status: DC | PRN
  Administered 2024-01-18 – 2024-01-20 (×7): 5 mg via ORAL
  Filled 2024-01-17 (×7): qty 1

## 2024-01-17 MED ORDER — LEVETIRACETAM IN NACL 1000 MG/100ML IV SOLN
1000.0000 mg | Freq: Two times a day (BID) | INTRAVENOUS | Status: DC
Start: 2024-01-17 — End: 2024-01-19
  Administered 2024-01-18 – 2024-01-19 (×4): 1000 mg via INTRAVENOUS
  Filled 2024-01-17 (×3): qty 100

## 2024-01-17 MED ORDER — DIVALPROEX SODIUM 500 MG PO DR TAB
500.0000 mg | DELAYED_RELEASE_TABLET | Freq: Once | ORAL | Status: AC
  Administered 2024-01-17: 500 mg via ORAL
  Filled 2024-01-17: qty 1

## 2024-01-17 MED ORDER — HYDROMORPHONE HCL 1 MG/ML IJ SOLN
1.0000 mg | INTRAMUSCULAR | Status: DC | PRN
  Administered 2024-01-18 – 2024-01-20 (×9): 1 mg via INTRAVENOUS
  Filled 2024-01-17 (×9): qty 1

## 2024-01-17 MED ORDER — KETOROLAC TROMETHAMINE 30 MG/ML IJ SOLN
15.0000 mg | Freq: Once | INTRAMUSCULAR | Status: AC
  Administered 2024-01-17: 15 mg via INTRAVENOUS
  Filled 2024-01-17: qty 1

## 2024-01-17 MED ORDER — FENTANYL CITRATE PF 50 MCG/ML IJ SOSY
50.0000 ug | PREFILLED_SYRINGE | Freq: Once | INTRAMUSCULAR | Status: AC
  Administered 2024-01-17: 50 ug via INTRAVENOUS
  Filled 2024-01-17: qty 1

## 2024-01-17 MED ORDER — LEVETIRACETAM IN NACL 1000 MG/100ML IV SOLN
1000.0000 mg | Freq: Two times a day (BID) | INTRAVENOUS | Status: DC
Start: 2024-01-17 — End: 2024-01-17
  Filled 2024-01-17: qty 100

## 2024-01-17 MED ORDER — SODIUM CHLORIDE 0.9% FLUSH
3.0000 mL | Freq: Two times a day (BID) | INTRAVENOUS | Status: DC
  Administered 2024-01-18 – 2024-01-23 (×10): 3 mL via INTRAVENOUS

## 2024-01-17 MED ORDER — LACTATED RINGERS IV BOLUS
1000.0000 mL | Freq: Once | INTRAVENOUS | Status: AC
  Administered 2024-01-18: 1000 mL via INTRAVENOUS

## 2024-01-17 MED ORDER — ENOXAPARIN SODIUM 40 MG/0.4ML IJ SOSY
40.0000 mg | PREFILLED_SYRINGE | INTRAMUSCULAR | Status: DC
  Administered 2024-01-18 – 2024-01-23 (×6): 40 mg via SUBCUTANEOUS
  Filled 2024-01-17 (×6): qty 0.4

## 2024-01-17 MED ORDER — ACETAMINOPHEN 650 MG RE SUPP: 650.0000 mg | Freq: Four times a day (QID) | RECTAL | Status: DC | PRN

## 2024-01-17 MED ORDER — LACTATED RINGERS IV SOLN: INTRAVENOUS | Status: AC

## 2024-01-17 MED ORDER — ACETAMINOPHEN 325 MG PO TABS
650.0000 mg | ORAL_TABLET | Freq: Four times a day (QID) | ORAL | Status: DC | PRN
  Administered 2024-01-20: 650 mg via ORAL
  Filled 2024-01-17: qty 2

## 2024-01-17 MED ORDER — LACTATED RINGERS IV BOLUS
1000.0000 mL | Freq: Once | INTRAVENOUS | Status: AC
  Administered 2024-01-17: 1000 mL via INTRAVENOUS

## 2024-01-17 MED ORDER — ONDANSETRON HCL 4 MG/2ML IJ SOLN
4.0000 mg | Freq: Once | INTRAMUSCULAR | Status: AC
  Administered 2024-01-17: 4 mg via INTRAVENOUS
  Filled 2024-01-17: qty 2

## 2024-01-17 MED ORDER — POLYETHYLENE GLYCOL 3350 17 G PO PACK: 17.0000 g | PACK | Freq: Every day | ORAL | Status: DC | PRN

## 2024-01-17 NOTE — ED Notes (Signed)
 Pt aware urine specimen needed. Pt given a cup

## 2024-01-17 NOTE — ED Triage Notes (Addendum)
 Patient here for evaluation of abdominal pain and seizures. Reports that he has been having nausea and vomiting but denies diarrhea. Pt also reports that he had 3 seizures last night and has been taking his medications. Reports being seen yesterday for the same, stated the meds work, but only for a little bit so he came back in.

## 2024-01-17 NOTE — Assessment & Plan Note (Addendum)
 Acute pancreatitis with onset on or around March 18.  With the no LFT abnormality.  No history of alcohol use.  Check lipid panel.  Complicated by prominent vomiting as well as ascites.  S/p 1 L LR in the ER.  I will give 1 more and start the patient on 125 cc/h infusion.  Oxycodone and Dilaudid as ordered as needed for pain control.  Patient does use valproic acid at home.  This will have to be discontinued because of boxed warning.  In relation to pancreatitis.  N.p.o. tonight.  Advance diet in morning.  Patinet meets SIRS criteria. However, clincially I do not think there is focus of infection. Close monitoring.

## 2024-01-17 NOTE — ED Provider Notes (Signed)
 [facility] AT [facility] Provider Note   CSN: [id number] Arrival date & time: 01/17/24  1534     History  Chief Complaint  Patient presents with   Abdominal Pain    Maxwell Faulkner is a 38 y.o. male with history of seizure, hypertension, presents with concern for diffuse abdominal pain that is been ongoing since last night.  He also reports decreased appetite and nausea, but no vomiting.  Denies any fever or chills.  Reports he was diagnosed with pancreatitis yesterday and sent home with pain medication nausea medication.  He states he is taking the oxycodone prescribed every 5 hours without significant relief of his pain.  He denies drinking any alcohol, states he does not drink due to being on seizure medication.  He reports using marijuana.  Denies any previous issues with his pancreas.   Abdominal Pain      Home Medications Prior to Admission medications   Medication Sig Start Date End Date Taking? Authorizing Provider  divalproex (DEPAKOTE) 500 MG DR tablet Take 2 tablets (1,000 mg total) by mouth 2 (two) times daily. 12/26/23   Dohmeier, Porfirio Mylar, MD  ondansetron (ZOFRAN-ODT) 4 MG disintegrating tablet Take 1 tablet (4 mg total) by mouth every 8 (eight) hours as needed for nausea or vomiting. 01/16/24   Smitty Knudsen, PA-C  oxyCODONE-acetaminophen (PERCOCET/ROXICET) 5-325 MG tablet Take 1 tablet by mouth every 6 (six) hours as needed for severe pain (pain score 7-10). 01/16/24   Nira Conn, MD  QUEtiapine (SEROQUEL) 50 MG tablet Take 1 tablet (50 mg total) by mouth at bedtime. 12/14/23   Lauro Franklin, MD  sertraline (ZOLOFT) 50 MG tablet Take 1 tablet (50 mg total) by mouth daily. 12/14/23   Lauro Franklin, MD      Allergies    Patient has no known allergies.    Review of Systems   Review of Systems  Gastrointestinal:  Positive for abdominal pain.    Physical Exam Updated Vital Signs BP (!) 148/87   Pulse 65    Temp 98.5 F (36.9 C)   Resp 16   Ht 5\' 6"  (1.676 m)   Wt 53.1 kg   SpO2 100%   BMI 18.89 kg/m  Physical Exam Vitals and nursing note reviewed.  Constitutional:      General: He is not in acute distress.    Appearance: He is well-developed.     Comments: Well-appearing, no distress.  No active vomiting  HENT:     Head: Normocephalic and atraumatic.  Eyes:     Conjunctiva/sclera: Conjunctivae normal.  Cardiovascular:     Rate and Rhythm: Normal rate and regular rhythm.     Heart sounds: No murmur heard. Pulmonary:     Effort: Pulmonary effort is normal. No respiratory distress.     Breath sounds: Normal breath sounds.  Abdominal:     Palpations: Abdomen is soft.     Tenderness: There is abdominal tenderness.     Comments: Diffuse abdominal tenderness to palpation, no rebound or guarding  Musculoskeletal:        General: No swelling.     Cervical back: Neck supple.  Skin:    General: Skin is warm and dry.     Capillary Refill: Capillary refill takes less than 2 seconds.  Neurological:     Mental Status: He is alert.  Psychiatric:        Mood and Affect: Mood normal.     ED Results /  Procedures / Treatments   Labs (all labs ordered are listed, but only abnormal results are displayed) Labs Reviewed  LIPASE, BLOOD - Abnormal; Notable for the following components:      Result Value   Lipase 195 (*)    All other components within normal limits  COMPREHENSIVE METABOLIC PANEL - Abnormal; Notable for the following components:   Chloride 97 (*)    All other components within normal limits  CBC - Abnormal; Notable for the following components:   WBC 12.5 (*)    RBC 4.21 (*)    Platelets 137 (*)    All other components within normal limits  URINALYSIS, ROUTINE W REFLEX MICROSCOPIC - Abnormal; Notable for the following components:   Hgb urine dipstick SMALL (*)    Ketones, ur 20 (*)    Protein, ur 30 (*)    All other components within normal limits     EKG None  Radiology CT ABDOMEN PELVIS W CONTRAST Result Date: 01/16/2024 CLINICAL DATA:  38 year old male with abdominal pain.  Pancreatitis. EXAM: CT ABDOMEN AND PELVIS WITH CONTRAST TECHNIQUE: Multidetector CT imaging of the abdomen and pelvis was performed using the standard protocol following bolus administration of intravenous contrast. RADIATION DOSE REDUCTION: This exam was performed according to the departmental dose-optimization program which includes automated exposure control, adjustment of the mA and/or kV according to patient size and/or use of iterative reconstruction technique. CONTRAST:  OMNIPAQUE IOHEXOL 300 MG/ML  SOLN COMPARISON:  Pelvis CT 12/11/2023. CT Abdomen and Pelvis 10/14/2023. FINDINGS: Lower chest: Lower lung volumes with dependent lung base atelectasis. No pericardial or pleural effusion. Hepatobiliary: New free fluid in the abdomen since December, simple fluid density. Mostly this is medial and inferior to the liver. Liver enhancement, gallbladder are within normal limits. Pancreas: Enlarged, inflamed, heterogeneously enhancing throughout. Abundant edema in the lesser sac and abundant regional surrounding upper abdominal confluent edema or low-density ascites. No discrete pancreatic necrosis. No discrete mass or ductal dilatation. No organized fluid collection. Spleen: Adjacent free fluid but otherwise negative. Adrenals/Urinary Tract: Anterior pararenal space ascites or edema, but otherwise negative adrenal glands. Small benign appearing renal cysts, the largest in the left lower pole has simple fluid density. (No follow up imaging recommended). Punctate left midpole nephrolithiasis. No hydronephrosis. No evidence of hydroureter. No delayed excretory images. Stomach/Bowel: Nondilated large and small bowel throughout the abdomen and pelvis with superimposed ascites, most abundant in the upper abdomen but also layering in the left pericolic gutter (series 2, image 46).  Retained stool in the transverse colon. No pneumoperitoneum identified. Cecum partially located in the pelvis. Appendix not well delineated. Retained fluid in the proximal stomach. Distal stomach and duodenum are mostly decompressed. Vascular/Lymphatic: Major arterial structures and the portal venous system including the splenic vein remain patent. Mild soft atherosclerotic plaque of the distal abdominal aorta suspected and stable. Superimposed iliac artery combined soft and calcified plaque. No lymphadenopathy identified. Reproductive: Negative. Other: Small volume of free fluid layering in the pelvis with simple fluid density on series 2, image 67. Musculoskeletal: Negative. IMPRESSION: 1. Acute Pancreatitis with generalized inflamed pancreas. Confluent edema and Ascites throughout the abdomen, with free fluid layering in the pelvis and paracolic gutters. No pancreatic necrosis, organized fluid collection, or other complicating features at this time. 2. No other acute or inflammatory process identified in the abdomen or pelvis. 3. Mild but advanced for age Aortoiliac calcified atherosclerosis. Punctate nephrolithiasis. Electronically Signed   By: Odessa Fleming M.D.   On: 01/16/2024 05:18  Procedures Procedures    Medications Ordered in ED Medications  fentaNYL (SUBLIMAZE) injection 50 mcg (has no administration in time range)  ketorolac (TORADOL) 30 MG/ML injection 15 mg (has no administration in time range)  lactated ringers bolus 1,000 mL (1,000 mLs Intravenous New Bag/Given 01/17/24 2108)  fentaNYL (SUBLIMAZE) injection 50 mcg (50 mcg Intravenous Given 01/17/24 2108)  ondansetron (ZOFRAN) injection 4 mg (4 mg Intravenous Given 01/17/24 2108)  divalproex (DEPAKOTE) DR tablet 500 mg (500 mg Oral Given 01/17/24 2124)    ED Course/ Medical Decision Making/ A&P                                 Medical Decision Making Amount and/or Complexity of Data Reviewed Labs: ordered.  Risk Prescription drug  management.     Differential diagnosis includes but is not limited to Cholelithiasis, cholangitis, choledocholithiasis, peptic ulcer, gastritis, gastroenteritis, appendicitis, IBS, IBD, DKA, nephrolithiasis, UTI, pyelonephritis, pancreatitis, diverticulitis, mesenteric ischemia, abdominal aortic aneurysm, small bowel obstruction, volvulus   ED Course:  Patient overall well-appearing, stable vital signs.  He does report ongoing abdominal pain, and he is tender to palpation of the abdomen diffusely.  No rebound or guarding. Patient given Zofran, fentanyl, and started on LR bolus.  He was given his evening dose of Depakote for seizure I Ordered, and personally interpreted labs.  The pertinent results include:   Lipase elevated at 195, this is improved from 282 yesterday CBC with leukocytosis of 12.5, this is improved from 17.2 yesterday CMP unremarkable Urinalysis with ketones and protein, no evidence of infection Upon re-evaluation, patient states pain is somewhat improved from these medications, but still not well-controlled.  I have low concern for any other acute intra-abdominal abnormality to warrant further imaging given patient was found to have acute pancreatitis yesterday, and elevated lipase today is consistent with his presentation.  No worsening of lab studies.  He does not have any vomiting, normal bowel movements, low concern for SBO.  No evidence of UTI.   Impression: Acute pancreatitis   Disposition:  Admission with hospitalist Dr. Samara Deist    Consultations Obtained: I requested consultation with the hospitalist Dr. Samara Deist,  and discussed lab and imaging findings as well as pertinent plan - they recommend: admission for further pain control.   External records from outside source obtained and reviewed including ER visit from yesterday where patient was found to have acute pancreatitis on CT, no evidence of this on labs with evaded lipase and  leukocytosis.              Final Clinical Impression(s) / ED Diagnoses Final diagnoses:  Idiopathic acute pancreatitis, unspecified complication status    Rx / DC Orders ED Discharge Orders     None         Arabella Merles, PA-C 01/17/24 2304    Benjiman Core, MD 01/21/24 [phone]

## 2024-01-17 NOTE — Assessment & Plan Note (Addendum)
 These are chronic. Started around 12/2022. Initialy tried keppra but had depression and somnolence from it. Also seems to have had at least 1 breakthrough seizure. No mri on file. Changed to depakote on or around 03/2023. This has caused patient to be disabled   Patient describes having 3 seizures last evening likely due to vomiting his valproic acid at home.  At this time valproic acid will be stopped as above.  Patient will be transition back to Keppra due to easy iv administration.  To ensure absorption, I am ordering 1 g twice daily IV.  Transition to oral once patient tolerating. Consider neurology input in this regard. I will order mri brain routine.

## 2024-01-17 NOTE — H&P (Addendum)
 History and Physical    Patient: Maxwell Faulkner YNW:[id number] DOB: 29-Jan-1986 DOA: 01/17/2024 DOS: the patient was seen and examined on 01/17/2024 PCP: Avanell Shackleton, NP-C  Patient coming from: Home  Chief Complaint:  Chief Complaint  Patient presents with   Abdominal Pain   HPI: Maxwell Faulkner is a 38 y.o. male with medical history significant of anxiety, depression, seizures.  Patient is disabled.  Lives with mom and son who take care of him.  Patient reports being in his usual state of health till Tuesday evening 2 days ago when he reports a new onset of abrupt nausea and vomiting, continuous associated with diffuse abdominal pain as if somebody was kicking him. No radidation. No agravating/reliveing factor. Patient denies any fever or diarrhea or prior similar episodes.  Patient was actually evaluated at [facility] long ER and impression of pancreatitis was made and patient discharged with oral Percocet.  Unfortunately patient reports that he had resumption of diffuse abdominal pain that was not controlled with Percocet and nausea and vomiting.  All night last night.  Associated with 3 back-to-back seizures as he also threw up his seizure medications.  Prompting the patient to come to the ER tonight.  Patient is s/p fentanyl meds as well as ketorolac as well as Zofran and a LR bolus.  Patient has received Depakote once in the ER.  Which patient has not thrown up since.  Medical evaluation is sought.    Review of Systems: As mentioned in the history of present illness. All other systems reviewed and are negative. Past Medical History:  Diagnosis Date   Anxiety    Depression    Hypertension    Seizures (HCC)    Past Surgical History:  Procedure Laterality Date   WISDOM TOOTH EXTRACTION     Social History:  reports that he has been smoking cigarettes. He has never used smokeless tobacco. He reports that he does not currently use alcohol. He reports current drug use. Drug:  Marijuana.  No Known Allergies  Family History  Problem Relation Age of Onset   Hypertension Mother     Prior to Admission medications   Medication Sig Start Date End Date Taking? Authorizing Provider  divalproex (DEPAKOTE) 500 MG DR tablet Take 2 tablets (1,000 mg total) by mouth 2 (two) times daily. 12/26/23   Dohmeier, Porfirio Mylar, MD  ondansetron (ZOFRAN-ODT) 4 MG disintegrating tablet Take 1 tablet (4 mg total) by mouth every 8 (eight) hours as needed for nausea or vomiting. 01/16/24   Smitty Knudsen, PA-C  oxyCODONE-acetaminophen (PERCOCET/ROXICET) 5-325 MG tablet Take 1 tablet by mouth every 6 (six) hours as needed for severe pain (pain score 7-10). 01/16/24   Nira Conn, MD  QUEtiapine (SEROQUEL) 50 MG tablet Take 1 tablet (50 mg total) by mouth at bedtime. 12/14/23   Lauro Franklin, MD  sertraline (ZOLOFT) 50 MG tablet Take 1 tablet (50 mg total) by mouth daily. 12/14/23   Lauro Franklin, MD    Physical Exam: Vitals:   01/17/24 1539 01/17/24 1548 01/17/24 1901 01/17/24 2210  BP: (!) 152/99  (!) 129/95 (!) 148/87  Pulse: (!) 104  82 65  Resp: 18  18 16   Temp: 99.1 F (37.3 C)  98.5 F (36.9 C) 98.5 F (36.9 C)  TempSrc: Oral  Oral   SpO2: 99%  100% 100%  Weight:  53.1 kg    Height:  5\' 6"  (1.676 m)     General: Patient got tearful due to  pain during this encounter Otherwise no physiologic distress Respiratory exam: Bilateral intravesicular Cardiovascular exam S1-S2 normal Abdomen bowel sounds normal all quadrants soft, all quadrants are tender severely to mild palpation.  No rebound Extremities warm without edema no focal deficit. Data Reviewed:  Labs on Admission:  Results for orders placed or performed during the hospital encounter of 01/17/24 (from the past 24 hours)  Lipase, blood     Status: Abnormal   Collection Time: 01/17/24  3:59 PM  Result Value Ref Range   Lipase 195 (H) 11 - 51 U/L  Comprehensive metabolic panel     Status: Abnormal    Collection Time: 01/17/24  3:59 PM  Result Value Ref Range   Sodium 135 135 - 145 mmol/L   Potassium 4.3 3.5 - 5.1 mmol/L   Chloride 97 (L) 98 - 111 mmol/L   CO2 26 22 - 32 mmol/L   Glucose, Bld 73 70 - 99 mg/dL   BUN 14 6 - 20 mg/dL   Creatinine, Ser 1.02 0.61 - 1.24 mg/dL   Calcium 9.2 8.9 - 72.5 mg/dL   Total Protein 7.0 6.5 - 8.1 g/dL   Albumin 3.5 3.5 - 5.0 g/dL   AST 22 15 - 41 U/L   ALT 11 0 - 44 U/L   Alkaline Phosphatase 55 38 - 126 U/L   Total Bilirubin 0.8 0.0 - 1.2 mg/dL   GFR, Estimated >36 >64 mL/min   Anion gap 12 5 - 15  CBC     Status: Abnormal   Collection Time: 01/17/24  3:59 PM  Result Value Ref Range   WBC 12.5 (H) 4.0 - 10.5 K/uL   RBC 4.21 (L) 4.22 - 5.81 MIL/uL   Hemoglobin 13.7 13.0 - 17.0 g/dL   HCT 40.3 47.4 - 25.9 %   MCV 97.9 80.0 - 100.0 fL   MCH 32.5 26.0 - 34.0 pg   MCHC 33.3 30.0 - 36.0 g/dL   RDW 56.3 87.5 - 64.3 %   Platelets 137 (L) 150 - 400 K/uL   nRBC 0.0 0.0 - 0.2 %  Urinalysis, Routine w reflex microscopic -Urine, Clean Catch     Status: Abnormal   Collection Time: 01/17/24  7:37 PM  Result Value Ref Range   Color, Urine YELLOW YELLOW   APPearance CLEAR CLEAR   Specific Gravity, Urine 1.027 1.005 - 1.030   pH 5.0 5.0 - 8.0   Glucose, UA NEGATIVE NEGATIVE mg/dL   Hgb urine dipstick SMALL (A) NEGATIVE   Bilirubin Urine NEGATIVE NEGATIVE   Ketones, ur 20 (A) NEGATIVE mg/dL   Protein, ur 30 (A) NEGATIVE mg/dL   Nitrite NEGATIVE NEGATIVE   Leukocytes,Ua NEGATIVE NEGATIVE   RBC / HPF 0-5 0 - 5 RBC/hpf   WBC, UA 0-5 0 - 5 WBC/hpf   Bacteria, UA NONE SEEN NONE SEEN   Squamous Epithelial / HPF 0-5 0 - 5 /HPF   Mucus PRESENT    Basic Metabolic Panel: Recent Labs  Lab 01/11/24 1009 01/15/24 2014 01/17/24 1559  NA 143 138 135  K 4.2 3.2* 4.3  CL 106 104 97*  CO2 30 26 26   GLUCOSE 99 113* 73  BUN 15 11 14   CREATININE 0.88 0.95 0.69  CALCIUM 9.2 8.9 9.2   Liver Function Tests: Recent Labs  Lab 01/11/24 1009  01/15/24 2014 01/17/24 1559  AST 15 21 22   ALT 7 12 11   ALKPHOS 59 45 55  BILITOT 0.3 0.7 0.8  PROT 6.8 6.6 7.0  ALBUMIN  4.2 3.6 3.5   Recent Labs  Lab 01/15/24 2014 01/17/24 1559  LIPASE 282* 195*   No results for input(s): "AMMONIA" in the last 168 hours. CBC: Recent Labs  Lab 01/11/24 1009 01/15/24 2014 01/17/24 1559  WBC 6.7 17.2* 12.5*  NEUTROABS 2.2 8.8*  --   HGB 12.5* 12.4* 13.7  HCT 37.6* 37.2* 41.2  MCV 99.1 97.6 97.9  PLT 168.0 202 137*   Cardiac Enzymes: No results for input(s): "CKTOTAL", "CKMB", "CKMBINDEX", "TROPONINIHS" in the last 168 hours.  BNP (last 3 results) No results for input(s): "PROBNP" in the last 8760 hours. CBG: Recent Labs  Lab 01/16/24 0348  GLUCAP 121*    Radiological Exams on Admission:  CT ABDOMEN PELVIS W CONTRAST Result Date: 01/16/2024 CLINICAL DATA:  38 year old male with abdominal pain.  Pancreatitis. EXAM: CT ABDOMEN AND PELVIS WITH CONTRAST TECHNIQUE: Multidetector CT imaging of the abdomen and pelvis was performed using the standard protocol following bolus administration of intravenous contrast. RADIATION DOSE REDUCTION: This exam was performed according to the departmental dose-optimization program which includes automated exposure control, adjustment of the mA and/or kV according to patient size and/or use of iterative reconstruction technique. CONTRAST:  OMNIPAQUE IOHEXOL 300 MG/ML  SOLN COMPARISON:  Pelvis CT 12/11/2023. CT Abdomen and Pelvis 10/14/2023. FINDINGS: Lower chest: Lower lung volumes with dependent lung base atelectasis. No pericardial or pleural effusion. Hepatobiliary: New free fluid in the abdomen since December, simple fluid density. Mostly this is medial and inferior to the liver. Liver enhancement, gallbladder are within normal limits. Pancreas: Enlarged, inflamed, heterogeneously enhancing throughout. Abundant edema in the lesser sac and abundant regional surrounding upper abdominal confluent edema  or low-density ascites. No discrete pancreatic necrosis. No discrete mass or ductal dilatation. No organized fluid collection. Spleen: Adjacent free fluid but otherwise negative. Adrenals/Urinary Tract: Anterior pararenal space ascites or edema, but otherwise negative adrenal glands. Small benign appearing renal cysts, the largest in the left lower pole has simple fluid density. (No follow up imaging recommended). Punctate left midpole nephrolithiasis. No hydronephrosis. No evidence of hydroureter. No delayed excretory images. Stomach/Bowel: Nondilated large and small bowel throughout the abdomen and pelvis with superimposed ascites, most abundant in the upper abdomen but also layering in the left pericolic gutter (series 2, image 46). Retained stool in the transverse colon. No pneumoperitoneum identified. Cecum partially located in the pelvis. Appendix not well delineated. Retained fluid in the proximal stomach. Distal stomach and duodenum are mostly decompressed. Vascular/Lymphatic: Major arterial structures and the portal venous system including the splenic vein remain patent. Mild soft atherosclerotic plaque of the distal abdominal aorta suspected and stable. Superimposed iliac artery combined soft and calcified plaque. No lymphadenopathy identified. Reproductive: Negative. Other: Small volume of free fluid layering in the pelvis with simple fluid density on series 2, image 67. Musculoskeletal: Negative. IMPRESSION: 1. Acute Pancreatitis with generalized inflamed pancreas. Confluent edema and Ascites throughout the abdomen, with free fluid layering in the pelvis and paracolic gutters. No pancreatic necrosis, organized fluid collection, or other complicating features at this time. 2. No other acute or inflammatory process identified in the abdomen or pelvis. 3. Mild but advanced for age Aortoiliac calcified atherosclerosis. Punctate nephrolithiasis. Electronically Signed   By: Odessa Fleming M.D.   On: 01/16/2024 05:18      No intake/output data recorded. No intake/output data recorded.       Assessment and Plan: * Pancreatitis Acute pancreatitis with onset on or around March 18.  With the no LFT abnormality.  No  history of alcohol use.  Check lipid panel.  Complicated by prominent vomiting as well as ascites.  S/p 1 L LR in the ER.  I will give 1 more and start the patient on 125 cc/h infusion.  Oxycodone and Dilaudid as ordered as needed for pain control.  Patient does use valproic acid at home.  This will have to be discontinued because of boxed warning.  In relation to pancreatitis.  N.p.o. tonight.  Advance diet in morning.  Patinet meets SIRS criteria. However, clincially I do not think there is focus of infection. Close monitoring.  Seizures (HCC) These are chronic. Started around 12/2022. Initialy tried keppra but had depression and somnolence from it. Also seems to have had at least 1 breakthrough seizure. No mri on file. Changed to depakote on or around 03/2023. This has caused patient to be disabled   Patient describes having 3 seizures last evening likely due to vomiting his valproic acid at home.  At this time valproic acid will be stopped as above.  Patient will be transition back to Keppra due to easy iv administration.  To ensure absorption, I am ordering 1 g twice daily IV.  Transition to oral once patient tolerating. Consider neurology input in this regard. I will order mri brain routine.   Med rec pending pharmacy input. EKG pending.   Advance Care Planning:   Code Status: Not on file full code.   Consults: none  Family Communication: per patietn.  Severity of Illness: The appropriate patient status for this patient is INPATIENT. Inpatient status is judged to be reasonable and necessary in order to provide the required intensity of service to ensure the patient's safety. The patient's presenting symptoms, physical exam findings, and initial radiographic and laboratory data in the  context of their chronic comorbidities is felt to place them at high risk for further clinical deterioration. Furthermore, it is not anticipated that the patient will be medically stable for discharge from the hospital within 2 midnights of admission.   * I certify that at the point of admission it is my clinical judgment that the patient will require inpatient hospital care spanning beyond 2 midnights from the point of admission due to high intensity of service, high risk for further deterioration and high frequency of surveillance required.*  Author: Nolberto Hanlon, MD 01/17/2024 11:29 PM  For on call review [email or web address].

## 2024-01-18 ENCOUNTER — Inpatient Hospital Stay (HOSPITAL_COMMUNITY)

## 2024-01-18 DIAGNOSIS — R1110 Vomiting, unspecified: Secondary | ICD-10-CM | POA: Diagnosis not present

## 2024-01-18 DIAGNOSIS — R569 Unspecified convulsions: Secondary | ICD-10-CM

## 2024-01-18 DIAGNOSIS — R109 Unspecified abdominal pain: Secondary | ICD-10-CM | POA: Diagnosis not present

## 2024-01-18 DIAGNOSIS — K859 Acute pancreatitis without necrosis or infection, unspecified: Secondary | ICD-10-CM | POA: Diagnosis not present

## 2024-01-18 LAB — BASIC METABOLIC PANEL
Anion gap: 7 (ref 5–15)
BUN: 18 mg/dL (ref 6–20)
CO2: 27 mmol/L (ref 22–32)
Calcium: 8.5 mg/dL — ABNORMAL LOW (ref 8.9–10.3)
Chloride: 103 mmol/L (ref 98–111)
Creatinine, Ser: 0.63 mg/dL (ref 0.61–1.24)
GFR, Estimated: >60 mL/min (ref >60)
Glucose, Bld: 69 mg/dL — ABNORMAL LOW (ref 70–99)
Potassium: 3.2 mmol/L — ABNORMAL LOW (ref 3.5–5.1)
Sodium: 137 mmol/L (ref 135–145)

## 2024-01-18 LAB — CBC
HCT: 31.6 % — ABNORMAL LOW (ref 39.0–52.0)
Hemoglobin: 11 g/dL — ABNORMAL LOW (ref 13.0–17.0)
MCH: 33.5 pg (ref 26.0–34.0)
MCHC: 34.8 g/dL (ref 30.0–36.0)
MCV: 96.3 fL (ref 80.0–100.0)
Platelets: 133 10*3/uL — ABNORMAL LOW (ref 150–400)
RBC: 3.28 MIL/uL — ABNORMAL LOW (ref 4.22–5.81)
RDW: 13.4 % (ref 11.5–15.5)
WBC: 8.6 10*3/uL (ref 4.0–10.5)
nRBC: 0 % (ref 0.0–0.2)

## 2024-01-18 LAB — LIPID PANEL
Cholesterol: 131 mg/dL (ref 0–200)
HDL: 27 mg/dL — ABNORMAL LOW (ref >40)
LDL Cholesterol: 81 mg/dL (ref 0–99)
Total CHOL/HDL Ratio: 4.9 ratio
Triglycerides: 117 mg/dL (ref <150)
VLDL: 23 mg/dL (ref 0–40)

## 2024-01-18 LAB — PROTIME-INR
INR: 1.1 (ref 0.8–1.2)
Prothrombin Time: 14.7 s (ref 11.4–15.2)

## 2024-01-18 LAB — APTT: aPTT: 41 s — ABNORMAL HIGH (ref 24–36)

## 2024-01-18 LAB — VALPROIC ACID LEVEL: Valproic Acid Lvl: 58 ug/mL (ref 50.0–100.0)

## 2024-01-18 MED ORDER — GADOBUTROL 1 MMOL/ML IV SOLN
5.0000 mL | Freq: Once | INTRAVENOUS | Status: AC | PRN
  Administered 2024-01-18: 5 mL via INTRAVENOUS

## 2024-01-18 MED ORDER — SERTRALINE HCL 50 MG PO TABS
50.0000 mg | ORAL_TABLET | Freq: Every day | ORAL | Status: DC
  Administered 2024-01-18 – 2024-01-23 (×6): 50 mg via ORAL
  Filled 2024-01-18 (×6): qty 1

## 2024-01-18 MED ORDER — POTASSIUM CHLORIDE CRYS ER 20 MEQ PO TBCR
40.0000 meq | EXTENDED_RELEASE_TABLET | Freq: Two times a day (BID) | ORAL | Status: AC
  Administered 2024-01-18 (×2): 40 meq via ORAL
  Filled 2024-01-18 (×2): qty 2

## 2024-01-18 MED ORDER — LORAZEPAM 2 MG/ML IJ SOLN
1.0000 mg | INTRAMUSCULAR | Status: DC | PRN
  Administered 2024-01-19 (×2): 1 mg via INTRAVENOUS
  Filled 2024-01-18 (×3): qty 1

## 2024-01-18 MED ORDER — DIVALPROEX SODIUM 250 MG PO DR TAB
1000.0000 mg | DELAYED_RELEASE_TABLET | Freq: Two times a day (BID) | ORAL | Status: DC
  Administered 2024-01-18 – 2024-01-19 (×3): 1000 mg via ORAL
  Filled 2024-01-18 (×2): qty 4
  Filled 2024-01-18: qty 2

## 2024-01-18 MED ORDER — QUETIAPINE FUMARATE 50 MG PO TABS
50.0000 mg | ORAL_TABLET | Freq: Every day | ORAL | Status: DC
  Administered 2024-01-18 – 2024-01-22 (×5): 50 mg via ORAL
  Filled 2024-01-18 (×5): qty 1

## 2024-01-18 NOTE — ED Notes (Signed)
 Patient was transported to MRI.

## 2024-01-18 NOTE — Progress Notes (Addendum)
 [facility]                                                                               Maxwell Faulkner, is a 38 y.o. male, DOB - Jan 26, 1986, WUJ:[id number] Admit date - 01/17/2024    Outpatient Primary MD for the patient is [name], [name], NP-C  LOS - 1  days    Brief summary     Maxwell Faulkner is a 38 y.o. male with medical history significant of anxiety, depression, seizures presents to ED for abdominal pain. He was admitted for acute pancreatitis.    Assessment & Plan    Assessment and Plan: * Pancreatitis  Acute pancreatitis,  Admitted for IV fluids, IV pain control.  Bowel rest. NPO except sips for meds.   Seizures (HCC) Continue with valproic acid and keppra  Check levels today.    Hypokalemia Replaced. Repeat levels in am.   Anxiety and depression:  Resume sertraline and quetiapine.   Substance abuse:  UDS is positive for THC, opiates and benzodiazepines.    Mild thrombocytopenia Monitor.  No evidence of bleeding.     Estimated body mass index is 18.89 kg/m as calculated from the following:   Height as of this encounter: 5\' 6"  (1.676 m).   Weight as of this encounter: 53.1 kg.  Code Status: full code.  DVT Prophylaxis:  enoxaparin (LOVENOX) injection 40 mg Start: 01/18/24 0800 SCDs Start: 01/17/24 2343   Level of Care: Level of care: Telemetry Family Communication: family at bedside.   Disposition Plan:     Remains inpatient appropriate: pain not controlled.   Procedures:  None.   Consultants:   None.   Antimicrobials:   Anti-infectives (From admission, onward)    None        Medications  Scheduled Meds:  divalproex  1,000 mg Oral BID   enoxaparin (LOVENOX) injection  40 mg Subcutaneous Q24H   potassium chloride  40 mEq Oral BID   QUEtiapine  50 mg Oral QHS   sertraline  50 mg Oral Daily   sodium chloride flush  3 mL Intravenous Q12H   Continuous Infusions:  levETIRAcetam Stopped (01/18/24 1159)   PRN  Meds:.acetaminophen **OR** acetaminophen, HYDROmorphone (DILAUDID) injection, LORazepam, oxyCODONE, polyethylene glycol    Subjective:   Maxwell Faulkner was seen and examined today. Still with persistent abdominal pain.  Objective:   Vitals:   01/18/24 1300 01/18/24 1345 01/18/24 1445 01/18/24 1506  BP: (!) 143/94 125/77 118/74 (!) 144/98  Pulse: 68 (!) 59 62 (!) 54  Resp: 18 14 15 18   Temp:    99 F (37.2 C)  TempSrc:    Oral  SpO2: 98% 98% 96% 99%  Weight:      Height:        Intake/Output Summary (Last 24 hours) at 01/18/2024 1526 Last data filed at 01/18/2024 9562 Gross per 24 hour  Intake 3 ml  Output --  Net 3 ml   Filed Weights   01/17/24 1548  Weight: 53.1 kg     Exam General: Alert and oriented x 3, NAD Cardiovascular: S1 S2 auscultated, no murmurs, RRR Respiratory: Clear to auscultation bilaterally, no wheezing, rales or  rhonchi Gastrointestinal: soft, generalized tenderness, bs+ Ext: no pedal edema bilaterally Neuro: AAOx3, Cr N's II- XII.  Skin: No rashes Psych: Normal affect and demeanor, alert and oriented x3    Data Reviewed:  I have personally reviewed following labs and imaging studies   CBC Lab Results  Component Value Date   WBC 8.6 01/18/2024   RBC 3.28 (L) 01/18/2024   HGB 11.0 (L) 01/18/2024   HCT 31.6 (L) 01/18/2024   MCV 96.3 01/18/2024   MCH 33.5 01/18/2024   PLT 133 (L) 01/18/2024   MCHC 34.8 01/18/2024   RDW 13.4 01/18/2024   LYMPHSABS 6.7 (H) 01/15/2024   MONOABS 1.3 (H) 01/15/2024   EOSABS 0.2 01/15/2024   BASOSABS 0.1 01/15/2024     Last metabolic panel Lab Results  Component Value Date   NA 137 01/18/2024   K 3.2 (L) 01/18/2024   CL 103 01/18/2024   CO2 27 01/18/2024   BUN 18 01/18/2024   CREATININE 0.63 01/18/2024   GLUCOSE 69 (L) 01/18/2024   GFRNONAA >60 01/18/2024   GFRAA >60 03/29/2019   CALCIUM 8.5 (L) 01/18/2024   PROT 7.0 01/17/2024   ALBUMIN 3.5 01/17/2024   LABGLOB 2.7 08/01/2023   BILITOT 0.8  01/17/2024   ALKPHOS 55 01/17/2024   AST 22 01/17/2024   ALT 11 01/17/2024   ANIONGAP 7 01/18/2024    CBG (last 3)  Recent Labs    01/16/24 0348  GLUCAP 121*      Coagulation Profile: Recent Labs  Lab 01/18/24 0545  INR 1.1     Radiology Studies: MR BRAIN W WO CONTRAST Result Date: 01/18/2024 CLINICAL DATA:  38 year old male with abdominal pain, pancreatitis, vomiting, seizures. EXAM: MRI HEAD WITHOUT AND WITH CONTRAST TECHNIQUE: Multiplanar, multiecho pulse sequences of the brain and surrounding structures were obtained without and with intravenous contrast. CONTRAST:  5mL GADAVIST GADOBUTROL 1 MMOL/ML IV SOLN COMPARISON:  Head CT 10/23/2023. FINDINGS: Brain: Normal cerebral volume. No restricted diffusion to suggest acute infarction. No midline shift, mass effect, evidence of mass lesion, ventriculomegaly, extra-axial collection or acute intracranial hemorrhage. Cervicomedullary junction and pituitary are within normal limits. Wallace Cullens and white matter signal is within normal limits throughout the brain. No encephalomalacia or chronic cerebral blood products. On thin slice coronal images the hippocampal formations and mesial temporal lobes appear symmetric and normal. No abnormal enhancement identified. No dural thickening. Vascular: Major intracranial vascular flow voids are preserved. Following contrast the major dural venous sinuses are enhancing and appear to be patent. Skull and upper cervical spine: Visualized bone marrow signal is within normal limits. Normal visible cervical spine. Sinuses/Orbits: Orbits are negative. Paranasal sinuses remain well aerated. Other: Trace bilateral mastoid fluid. Other visible internal auditory structures appear normal. Negative visible scalp and face. IMPRESSION: Normal MRI appearance of the Brain. Electronically Signed   By: Odessa Fleming M.D.   On: 01/18/2024 04:01       Kathlen Mody M.D. [facility] 01/18/2024, 3:26 PM  Available via Epic  secure chat 7am-7pm After 7 pm, please refer to night coverage provider listed on amion.

## 2024-01-19 ENCOUNTER — Inpatient Hospital Stay (HOSPITAL_COMMUNITY)

## 2024-01-19 DIAGNOSIS — K859 Acute pancreatitis without necrosis or infection, unspecified: Secondary | ICD-10-CM | POA: Diagnosis not present

## 2024-01-19 DIAGNOSIS — E876 Hypokalemia: Secondary | ICD-10-CM | POA: Diagnosis not present

## 2024-01-19 DIAGNOSIS — G40901 Epilepsy, unspecified, not intractable, with status epilepticus: Secondary | ICD-10-CM

## 2024-01-19 DIAGNOSIS — R569 Unspecified convulsions: Secondary | ICD-10-CM | POA: Diagnosis not present

## 2024-01-19 LAB — LEVETIRACETAM LEVEL: Levetiracetam Lvl: 24.7 ug/mL (ref 10.0–40.0)

## 2024-01-19 LAB — BASIC METABOLIC PANEL
Anion gap: 7 (ref 5–15)
BUN: 12 mg/dL (ref 6–20)
CO2: 28 mmol/L (ref 22–32)
Calcium: 8.7 mg/dL — ABNORMAL LOW (ref 8.9–10.3)
Chloride: 106 mmol/L (ref 98–111)
Creatinine, Ser: 0.79 mg/dL (ref 0.61–1.24)
GFR, Estimated: >60 mL/min (ref >60)
Glucose, Bld: 89 mg/dL (ref 70–99)
Potassium: 4 mmol/L (ref 3.5–5.1)
Sodium: 141 mmol/L (ref 135–145)

## 2024-01-19 LAB — LIPASE, BLOOD: Lipase: 145 U/L — ABNORMAL HIGH (ref 11–51)

## 2024-01-19 MED ORDER — SODIUM CHLORIDE 0.9 % IV SOLN
2000.0000 mg | Freq: Two times a day (BID) | INTRAVENOUS | Status: DC
  Administered 2024-01-19 – 2024-01-20 (×3): 2000 mg via INTRAVENOUS
  Filled 2024-01-19 (×5): qty 20

## 2024-01-19 MED ORDER — LACTATED RINGERS IV SOLN: INTRAVENOUS | Status: DC

## 2024-01-19 MED ORDER — LORAZEPAM 2 MG/ML IJ SOLN
1.0000 mg | INTRAMUSCULAR | Status: DC | PRN
  Administered 2024-01-20 – 2024-01-21 (×3): 2 mg via INTRAVENOUS
  Filled 2024-01-19 (×3): qty 1

## 2024-01-19 NOTE — Plan of Care (Signed)

## 2024-01-19 NOTE — Progress Notes (Signed)
 LTM EEG hooked up and running - no initial skin breakdown - push button tested - [facility] monitoring.

## 2024-01-19 NOTE — Progress Notes (Signed)
 Patient mother called to make aware about patient transporting to [facility]

## 2024-01-19 NOTE — Progress Notes (Signed)
Patient leaving with [facility] ?

## 2024-01-19 NOTE — Plan of Care (Signed)
 38 year old male who presented to the [facility] ED on 3/20 with N/V and abdominal pain secondary to pancreatitis which had been diagnosed on the 18th at which time he had been sent home.   He has history of seizures and is taking VPA and Keppra at home. He had vomited up some of his pills prior to presenting. He had about 3 back to back GTC seizures which then precipitated, along with the GI symptoms, his return to the ED.   He has been admitted for management of his breakthough seizures and pancreatitis. Last VPA dose was this AM, but Hospitalist felt that his pancreatitis may be related to the VPA and it has been stopped.   This afternoon patient two additional seizures, the first lasting 2-3 minutes with initial blank staring followed by tremoring of all 4 limbs with eyes open but unresponsive. He then was postictal for 20 minutes. He had another 10 second episode with no signifcant postictal state.   Hospitalist team is requesting consultation by Neurology for AED management.   Increasing his Keppra from 1000 mg IV BID to 2000 mg IV BID with first dose ordered now STAT. Will continue off VPA for now. Ultimately this may need to be changed long-term to an alternate anticonvulsant. Estimated GFR is > 60.   Transferring to [facility] for LTM EEG.   Discussed with Dr. Blake Divine.   Electronically signed: Dr. Caryl Pina

## 2024-01-19 NOTE — Progress Notes (Signed)
 [facility]                                                                               [name], is a 38 y.o. male, DOB - 1986/10/01, WGN:[id number] Admit date - 01/17/2024    Outpatient Primary MD for the patient is Maxwell Faulkner, [name], NP-C  LOS - 2  days    Brief summary     Maxwell Faulkner is a 38 y.o. male with medical history significant of anxiety, depression, seizures presents to ED for abdominal pain. He was admitted for acute pancreatitis.    Assessment & Plan    Assessment and Plan:  Acute pancreatitis: Admitted for IV fluids, IV pain control.  Bowel rest.  Abdominal pain is improving. Nausea better, no vomiting.  Started on clears , advance as tolerated.  Lipase levels have improved from 195 to  145.  Continue to monitor.   Seizures (HCC) Repeated seizure activity back to back twice this evening. Neurology consulted and will transfer the patient to [facility] for LTM EEG.  Meanwhile Keppra 2 g bolus now and increase Keppra to 2000 mg BID.  VPA held for possible pancreatitis.     Hypokalemia Replaced. Repeat levels wnl.   Anxiety and depression:  Resume sertraline and quetiapine.   Substance abuse:  UDS is positive for THC, opiates and benzodiazepines.    Mild thrombocytopenia Monitor.  No evidence of bleeding.    Mild leukocytosis  resolved.     Estimated body mass index is 18.89 kg/m as calculated from the following:   Height as of this encounter: 5\' 6"  (1.676 m).   Weight as of this encounter: 53.1 kg.  Code Status: full code.  DVT Prophylaxis:  enoxaparin (LOVENOX) injection 40 mg Start: 01/18/24 0800 SCDs Start: 01/17/24 2343   Level of Care: Level of care: Telemetry Family Communication: none at bedside, discussed with mom over the phone today.   Disposition Plan:     Remains inpatient appropriate: pain not controlled.   Procedures:  None.   Consultants:   Neurology.   Antimicrobials:   Anti-infectives (From  admission, onward)    None        Medications  Scheduled Meds:  divalproex  1,000 mg Oral BID   enoxaparin (LOVENOX) injection  40 mg Subcutaneous Q24H   QUEtiapine  50 mg Oral QHS   sertraline  50 mg Oral Daily   sodium chloride flush  3 mL Intravenous Q12H   Continuous Infusions:  lactated ringers     levETIRAcetam 1,000 mg (01/18/24 2327)   PRN Meds:.acetaminophen **OR** acetaminophen, HYDROmorphone (DILAUDID) injection, LORazepam, oxyCODONE, polyethylene glycol    Subjective:   Maxwell Faulkner was seen and examined today.  Abd pain has improved. No nausea or vomiting.  Objective:   Vitals:   01/18/24 2247 01/19/24 0241 01/19/24 0553 01/19/24 0829  BP: 123/80 114/76 103/76 126/86  Pulse: 75 82 85 82  Resp:   19 14  Temp: 98.4 F (36.9 C) 98.5 F (36.9 C) 98.1 F (36.7 C) 98.6 F (37 C)  TempSrc: Oral Oral Oral   SpO2: 96% 94% 94% 98%  Weight:      Height:  Intake/Output Summary (Last 24 hours) at 01/19/2024 1137 Last data filed at 01/19/2024 0300 Gross per 24 hour  Intake 648.69 ml  Output 600 ml  Net 48.69 ml   Filed Weights   01/17/24 1548  Weight: 53.1 kg     Exam General exam: Appears calm and comfortable  Respiratory system: Clear to auscultation. Respiratory effort normal. Cardiovascular system: S1 & S2 heard, RRR. No JVD Gastrointestinal system: Abdomen is mild abd tenderness Central nervous system: Alert and oriented. No focal neurological deficits. Extremities: Symmetric 5 x 5 power. Skin: No rashes, lesions or ulcers Psychiatry: Mood & affect appropriate.     Data Reviewed:  I have personally reviewed following labs and imaging studies   CBC Lab Results  Component Value Date   WBC 8.6 01/18/2024   RBC 3.28 (L) 01/18/2024   HGB 11.0 (L) 01/18/2024   HCT 31.6 (L) 01/18/2024   MCV 96.3 01/18/2024   MCH 33.5 01/18/2024   PLT 133 (L) 01/18/2024   MCHC 34.8 01/18/2024   RDW 13.4 01/18/2024   LYMPHSABS 6.7 (H) 01/15/2024    MONOABS 1.3 (H) 01/15/2024   EOSABS 0.2 01/15/2024   BASOSABS 0.1 01/15/2024     Last metabolic panel Lab Results  Component Value Date   NA 141 01/19/2024   K 4.0 01/19/2024   CL 106 01/19/2024   CO2 28 01/19/2024   BUN 12 01/19/2024   CREATININE 0.79 01/19/2024   GLUCOSE 89 01/19/2024   GFRNONAA >60 01/19/2024   GFRAA >60 03/29/2019   CALCIUM 8.7 (L) 01/19/2024   PROT 7.0 01/17/2024   ALBUMIN 3.5 01/17/2024   LABGLOB 2.7 08/01/2023   BILITOT 0.8 01/17/2024   ALKPHOS 55 01/17/2024   AST 22 01/17/2024   ALT 11 01/17/2024   ANIONGAP 7 01/19/2024    CBG (last 3)  No results for input(s): "GLUCAP" in the last 72 hours.     Coagulation Profile: Recent Labs  Lab 01/18/24 0545  INR 1.1     Radiology Studies: MR BRAIN W WO CONTRAST Result Date: 01/18/2024 CLINICAL DATA:  38 year old male with abdominal pain, pancreatitis, vomiting, seizures. EXAM: MRI HEAD WITHOUT AND WITH CONTRAST TECHNIQUE: Multiplanar, multiecho pulse sequences of the brain and surrounding structures were obtained without and with intravenous contrast. CONTRAST:  5mL GADAVIST GADOBUTROL 1 MMOL/ML IV SOLN COMPARISON:  Head CT 10/23/2023. FINDINGS: Brain: Normal cerebral volume. No restricted diffusion to suggest acute infarction. No midline shift, mass effect, evidence of mass lesion, ventriculomegaly, extra-axial collection or acute intracranial hemorrhage. Cervicomedullary junction and pituitary are within normal limits. Wallace Cullens and white matter signal is within normal limits throughout the brain. No encephalomalacia or chronic cerebral blood products. On thin slice coronal images the hippocampal formations and mesial temporal lobes appear symmetric and normal. No abnormal enhancement identified. No dural thickening. Vascular: Major intracranial vascular flow voids are preserved. Following contrast the major dural venous sinuses are enhancing and appear to be patent. Skull and upper cervical spine: Visualized  bone marrow signal is within normal limits. Normal visible cervical spine. Sinuses/Orbits: Orbits are negative. Paranasal sinuses remain well aerated. Other: Trace bilateral mastoid fluid. Other visible internal auditory structures appear normal. Negative visible scalp and face. IMPRESSION: Normal MRI appearance of the Brain. Electronically Signed   By: Odessa Fleming M.D.   On: 01/18/2024 04:01       Kathlen Mody M.D. [facility] 01/19/2024, 11:37 AM  Available via Epic secure chat 7am-7pm After 7 pm, please refer to night coverage provider listed on amion.

## 2024-01-20 ENCOUNTER — Inpatient Hospital Stay (HOSPITAL_COMMUNITY)

## 2024-01-20 DIAGNOSIS — K8500 Idiopathic acute pancreatitis without necrosis or infection: Secondary | ICD-10-CM | POA: Diagnosis not present

## 2024-01-20 DIAGNOSIS — G40909 Epilepsy, unspecified, not intractable, without status epilepticus: Secondary | ICD-10-CM | POA: Diagnosis not present

## 2024-01-20 DIAGNOSIS — R569 Unspecified convulsions: Secondary | ICD-10-CM | POA: Diagnosis not present

## 2024-01-20 DIAGNOSIS — K859 Acute pancreatitis without necrosis or infection, unspecified: Secondary | ICD-10-CM | POA: Diagnosis not present

## 2024-01-20 LAB — PHOSPHORUS: Phosphorus: 3.5 mg/dL (ref 2.5–4.6)

## 2024-01-20 LAB — CBC WITH DIFFERENTIAL/PLATELET
Abs Immature Granulocytes: 0.02 10*3/uL (ref 0.00–0.07)
Basophils Absolute: 0 10*3/uL (ref 0.0–0.1)
Basophils Relative: 1 %
Eosinophils Absolute: 0.4 10*3/uL (ref 0.0–0.5)
Eosinophils Relative: 7 %
HCT: 30 % — ABNORMAL LOW (ref 39.0–52.0)
Hemoglobin: 10.3 g/dL — ABNORMAL LOW (ref 13.0–17.0)
Immature Granulocytes: 0 %
Lymphocytes Relative: 39 %
Lymphs Abs: 2.4 10*3/uL (ref 0.7–4.0)
MCH: 32.5 pg (ref 26.0–34.0)
MCHC: 34.3 g/dL (ref 30.0–36.0)
MCV: 94.6 fL (ref 80.0–100.0)
Monocytes Absolute: 0.5 10*3/uL (ref 0.1–1.0)
Monocytes Relative: 8 %
Neutro Abs: 2.8 10*3/uL (ref 1.7–7.7)
Neutrophils Relative %: 45 %
Platelets: 172 10*3/uL (ref 150–400)
RBC: 3.17 MIL/uL — ABNORMAL LOW (ref 4.22–5.81)
RDW: 13.5 % (ref 11.5–15.5)
WBC: 6.2 10*3/uL (ref 4.0–10.5)
nRBC: 0 % (ref 0.0–0.2)

## 2024-01-20 LAB — COMPREHENSIVE METABOLIC PANEL
ALT: 15 U/L (ref 0–44)
ALT: 14 U/L (ref 0–44)
AST: 32 U/L (ref 15–41)
AST: 30 U/L (ref 15–41)
Albumin: 2.6 g/dL — ABNORMAL LOW (ref 3.5–5.0)
Albumin: 2.5 g/dL — ABNORMAL LOW (ref 3.5–5.0)
Alkaline Phosphatase: 42 U/L (ref 38–126)
Alkaline Phosphatase: 43 U/L (ref 38–126)
Anion gap: 3 — ABNORMAL LOW (ref 5–15)
Anion gap: 7 (ref 5–15)
BUN: 8 mg/dL (ref 6–20)
BUN: 9 mg/dL (ref 6–20)
CO2: 31 mmol/L (ref 22–32)
CO2: 30 mmol/L (ref 22–32)
Calcium: 8.4 mg/dL — ABNORMAL LOW (ref 8.9–10.3)
Calcium: 8.6 mg/dL — ABNORMAL LOW (ref 8.9–10.3)
Chloride: 105 mmol/L (ref 98–111)
Chloride: 105 mmol/L (ref 98–111)
Creatinine, Ser: 0.77 mg/dL (ref 0.61–1.24)
Creatinine, Ser: 0.81 mg/dL (ref 0.61–1.24)
GFR, Estimated: >60 mL/min (ref >60)
GFR, Estimated: >60 mL/min (ref >60)
Glucose, Bld: 92 mg/dL (ref 70–99)
Glucose, Bld: 115 mg/dL — ABNORMAL HIGH (ref 70–99)
Potassium: 3.7 mmol/L (ref 3.5–5.1)
Potassium: 3.6 mmol/L (ref 3.5–5.1)
Sodium: 139 mmol/L (ref 135–145)
Sodium: 142 mmol/L (ref 135–145)
Total Bilirubin: 0.4 mg/dL (ref 0.0–1.2)
Total Bilirubin: 0.5 mg/dL (ref 0.0–1.2)
Total Protein: 5.2 g/dL — ABNORMAL LOW (ref 6.5–8.1)
Total Protein: 5.1 g/dL — ABNORMAL LOW (ref 6.5–8.1)

## 2024-01-20 LAB — LIPASE, BLOOD
Lipase: 131 U/L — ABNORMAL HIGH (ref 11–51)
Lipase: 175 U/L — ABNORMAL HIGH (ref 11–51)

## 2024-01-20 LAB — MAGNESIUM: Magnesium: 1.6 mg/dL — ABNORMAL LOW (ref 1.7–2.4)

## 2024-01-20 MED ORDER — LEVETIRACETAM IN NACL 1500 MG/100ML IV SOLN
1500.0000 mg | Freq: Two times a day (BID) | INTRAVENOUS | Status: DC
  Administered 2024-01-21 – 2024-01-22 (×3): 1500 mg via INTRAVENOUS
  Filled 2024-01-20 (×3): qty 100

## 2024-01-20 MED ORDER — MAGNESIUM SULFATE 2 GM/50ML IV SOLN
2.0000 g | Freq: Once | INTRAVENOUS | Status: AC
  Administered 2024-01-20: 2 g via INTRAVENOUS
  Filled 2024-01-20: qty 50

## 2024-01-20 MED ORDER — HYDROMORPHONE HCL 1 MG/ML IJ SOLN
0.5000 mg | INTRAMUSCULAR | Status: DC | PRN
  Administered 2024-01-20 – 2024-01-23 (×8): 0.5 mg via INTRAVENOUS
  Filled 2024-01-20 (×8): qty 0.5

## 2024-01-20 MED ORDER — OXYCODONE HCL 5 MG PO TABS
5.0000 mg | ORAL_TABLET | Freq: Four times a day (QID) | ORAL | Status: DC | PRN
  Administered 2024-01-20 – 2024-01-23 (×7): 5 mg via ORAL
  Filled 2024-01-20 (×7): qty 1

## 2024-01-20 MED ORDER — ONDANSETRON HCL 4 MG/2ML IJ SOLN
4.0000 mg | Freq: Four times a day (QID) | INTRAMUSCULAR | Status: DC | PRN
  Administered 2024-01-21: 4 mg via INTRAVENOUS
  Filled 2024-01-20: qty 2

## 2024-01-20 NOTE — Consult Note (Addendum)
 NEUROLOGY CONSULT NOTE   Date of service: January 20, 2024 Patient Name: Maxwell Faulkner MRN:  [id number] DOB:  1986/07/05 Chief Complaint: "Seizure" Requesting Provider: Kathlen Mody, MD  History of Present Illness  Maxwell Faulkner is a 38 y.o. male with hx of anxiety, depression, hypertension, seizures-on Depakote, patient of Dr. Teresa Coombs at [facility], admitted for acute pancreatitis with nausea and vomiting, had back-to-back episodes of seizures while at [facility] where he was receiving treatment for pancreatitis and was recommended to be transferred to [facility]. Patient was unable to provide any history at this time.  His family member at bedside said chart review: Reportedly patient seizures started less than a year ago out of the blue with no provoking factors.  He has had an outpatient EEG done which has been normal.  Reportedly he was admitted at some hospital and medications were started but he did not feel that the medications are working-he had seizures that are associated with tongue biting and urinary incontinence as well as postictal confusion.  His family member said that the seizures can happen without any particular frequency or provocation.  Antiseizure medications that she was on valproate but due to the diagnosis of pancreatitis, valproate was held.  He got Keppra 2 g bolus and his Keppra was increased to 2000 mg twice daily after 2 back-to-back seizures.  UDS positive for THC, opiates and benzodiazepines.  Dr Wadie Lessen was contacted for consult and provided initial plan of care as pasted below: 38 year old male who presented to the [facility] ED on 3/20 with N/V and abdominal pain secondary to pancreatitis which had been diagnosed on the 18th at which time he had been sent home.   He has history of seizures and is taking VPA and Keppra at home. He had vomited up some of his pills prior to presenting. He had about 3 back to back GTC seizures which then  precipitated, along with the GI symptoms, his return to the ED.  He has been admitted for management of his breakthough seizures and pancreatitis. Last VPA dose was this AM, but Hospitalist felt that his pancreatitis may be related to the VPA and it has been stopped.  This afternoon patient two additional seizures, the first lasting 2-3 minutes with initial blank staring followed by tremoring of all 4 limbs with eyes open but unresponsive. He then was postictal for 20 minutes. He had another 10 second episode with no signifcant postictal state.  Hospitalist team is requesting consultation by Neurology for AED management.  Increasing his Keppra from 1000 mg IV BID to 2000 mg IV BID with first dose ordered now STAT. Will continue off VPA for now. Ultimately this may need to be changed long-term to an alternate anticonvulsant. Estimated GFR is > 60.  Transferring to [facility] for LTM EEG.    ROS  Comprehensive ROS performed and pertinent positives documented in HPI    Past History   Past Medical History:  Diagnosis Date   Anxiety    Depression    Hypertension    Seizures (HCC)     Past Surgical History:  Procedure Laterality Date   WISDOM TOOTH EXTRACTION      Family History: Family History  Problem Relation Age of Onset   Hypertension Mother     Social History  reports that he has been smoking cigarettes. He has never used smokeless tobacco. He reports that he does not currently use alcohol. He reports current drug use. Drug:  Marijuana.  No Known Allergies  Medications   Current Facility-Administered Medications:    acetaminophen (TYLENOL) tablet 650 mg, 650 mg, Oral, Q6H PRN **OR** acetaminophen (TYLENOL) suppository 650 mg, 650 mg, Rectal, Q6H PRN, Nolberto Hanlon, MD   enoxaparin (LOVENOX) injection 40 mg, 40 mg, Subcutaneous, Q24H, Nolberto Hanlon, MD, 40 mg at 01/19/24 1610   HYDROmorphone (DILAUDID) injection 1 mg, 1 mg, Intravenous, Q3H PRN, Nolberto Hanlon, MD, 1 mg at 01/19/24 2038    lactated ringers infusion, , Intravenous, Continuous, Kathlen Mody, MD, Last Rate: 75 mL/hr at 01/19/24 1152, New Bag at 01/19/24 1152   levETIRAcetam (KEPPRA) 2,000 mg in sodium chloride 0.9 % 250 mL IVPB, 2,000 mg, Intravenous, Q12H, Caryl Pina, MD, Last Rate: 900 mL/hr at 01/19/24 1829, 2,000 mg at 01/19/24 1829   LORazepam (ATIVAN) injection 1-2 mg, 1-2 mg, Intravenous, Q4H PRN, Kathlen Mody, MD   oxyCODONE (Oxy IR/ROXICODONE) immediate release tablet 5 mg, 5 mg, Oral, Q4H PRN, Nolberto Hanlon, MD, 5 mg at 01/19/24 2337   polyethylene glycol (MIRALAX / GLYCOLAX) packet 17 g, 17 g, Oral, Daily PRN, Nolberto Hanlon, MD   QUEtiapine (SEROQUEL) tablet 50 mg, 50 mg, Oral, QHS, Kathlen Mody, MD, 50 mg at 01/19/24 2337   sertraline (ZOLOFT) tablet 50 mg, 50 mg, Oral, Daily, Kathlen Mody, MD, 50 mg at 01/19/24 0837   sodium chloride flush (NS) 0.9 % injection 3 mL, 3 mL, Intravenous, Q12H, Nolberto Hanlon, MD, 3 mL at 01/19/24 0838  Vitals   Vitals:   01/19/24 1850 01/19/24 1947 01/19/24 2026 01/19/24 2337  BP: 123/82 (!) 142/89 (!) 142/85 (!) 148/88  Pulse: 61 74 66 68  Resp: 12 16 17 13   Temp: 98.1 F (36.7 C) 98.2 F (36.8 C) 98.2 F (36.8 C) 97.6 F (36.4 C)  TempSrc: Oral Oral Oral Oral  SpO2: 98% 98% 100% 97%  Weight:      Height:        Body mass index is 18.89 kg/m.  Physical Exam  General: Sleeping in bed, drowsy, opens eyes but appears to have psychomotor slowing. HEENT: Normocephalic atraumatic Lungs: Clear Cardiovascular: Regular rhythm Abdomen nondistended nontender Neurological exam Sleeping in bed, drowsy when opens his eyes, participates with the exam but slow to respond to all questions No gross aphasia No gross dysarthria Oriented to self, family member and the fact that he is in the hospital. Cranial nerves II to XII intact Motor examination with no drift in the upper extremities.  Symmetrically weak bilateral lower extremities. Sensation intact Coordination  examination with no dysmetria  Labs/Imaging/Neurodiagnostic studies   CBC:  Recent Labs  Lab February 10, 2024 2014 01/17/24 1559 01/18/24 0545  WBC 17.2* 12.5* 8.6  NEUTROABS 8.8*  --   --   HGB 12.4* 13.7 11.0*  HCT 37.2* 41.2 31.6*  MCV 97.6 97.9 96.3  PLT 202 137* 133*   Basic Metabolic Panel:  Lab Results  Component Value Date   NA 141 01/19/2024   K 4.0 01/19/2024   CO2 28 01/19/2024   GLUCOSE 89 01/19/2024   BUN 12 01/19/2024   CREATININE 0.79 01/19/2024   CALCIUM 8.7 (L) 01/19/2024   GFRNONAA >60 01/19/2024   GFRAA >60 03/29/2019   Lipid Panel:  Lab Results  Component Value Date   LDLCALC 81 01/17/2024   HgbA1c: No results found for: "HGBA1C" Urine Drug Screen:     Component Value Date/Time   LABOPIA POSITIVE (A) 01/16/2024 0450   COCAINSCRNUR NONE DETECTED 01/16/2024 0450   LABBENZ POSITIVE (A) 01/16/2024  0450   AMPHETMU NONE DETECTED 01/16/2024 0450   THCU POSITIVE (A) 01/16/2024 0450   LABBARB NONE DETECTED 01/16/2024 0450    Alcohol Level     Component Value Date/Time   ETH <10 01/16/2024 0330   INR  Lab Results  Component Value Date   INR 1.1 01/18/2024   APTT  Lab Results  Component Value Date   APTT 41 (H) 01/18/2024   AED levels:  Lab Results  Component Value Date   LEVETIRACETA 24.7 01/18/2024    MR brain without contrast-normal  ASSESSMENT   Maxwell Faulkner is a 38 y.o. 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  Transferred to [facility] for LTM EEG hookup to ensure that he does not have electrographic nonconvulsive status epilepticus.  On examination it does not look like he is seizing.  Likely breakthrough seizures in the setting of holding valproate/non absorption of valproate due to pancreatitis and vomiting.  RECOMMENDATIONS  Seizure precautions LTM EEG For now -  Continue Keppra 2 g twice daily.  Hold VPA - Will not be a good canddiate for VPA in the future - will need alternate ASD if not controlled well on Keppra Management of pancreatitis and other medical issues per primary team as you are Will follow ______________________________________________________________________    Signed, Milon Dikes, MD [facility]

## 2024-01-20 NOTE — Plan of Care (Signed)

## 2024-01-20 NOTE — Progress Notes (Signed)
 PROGRESS NOTE    Maxwell Faulkner  GEX:[id number] DOB: 1986/10/27 DOA: 01/17/2024 PCP: Avanell Shackleton, NP-C   Brief Narrative:  Patient is a 38 year old African-American male with a past medical history significant for Manson Passey to anxiety, depression, seizures as well as other comorbidities presented the ED for abdominal pain and was admitted for acute pancreatitis.  He was vomiting and was not able to take his seizure medications and subsequently had 2 seizures transferred to [facility] for LTM EEG and neurology consultation.  Neurology loaded patient with Keppra and discontinue his VPA.  He is getting IV fluid hydration and his pain is improving but was somnolent so pain regimen is being adjusted.  Assessment and Plan:  Acute Pancreatitis: Admitted for IV fluids, IV pain control. Bowel rest initiated and now diet has been advanced to SOFT Diet  -Abdominal pain is improving. Nausea better, no vomiting.  -CT Abd/Pelvs showed pancreatitis with generalized inflamed pancreas and confluent edema ascites throughout the abdomen with free fluid layering in the pelvis and paracolic gutters with no pancreatic necrosis or organized fluid collection or other complicating factors noted. -Lipase levels went from 195 -> 145 -> 175 -C/w IVF with LR @ 75 mL/hr and C/w Supportive Care with Antiemetics with IV Ondansetron 4 mg q6hprn. C/w Pain Control but adjust medications given patient somnolence and reduce Hydromorphone to 0.5 mg IV q4hprn Severe Pain and Oxycodone to 5 mg po q6hprn Moderate Pain   Breakthrough seizure in show of seizure disorder (HCC): -Repeated seizure activity noted 01/19/24. Neurology consulted and Patient was transferred to [facility] for LTM EEG. EEG showed evidence of epileptogenicity arising from the left temporoparietal region.  Initiated on Keppra load with 2 g bolus and and placed on Keppra 2000 mg twice daily and now reduced to 50 mg p.o. twice daily.  Neurology recommends addition of a second AED  and preferably not VPA if he has further breakthrough seizures since admission.  Neurology felt that the seizure was in setting of holding his valproate and nonexertional.  Due to the pancreatitis and vomiting. -VPA recommended to be held indefinitely and discontinued given the pancreatitis.   Anxiety and Depression: C/w Sertraline 50 mg po qHS and Quetiapine 50 mg qHS   Polysubstance abuse: UDS is + for THC, opiates and benzodiazepines. Counseling given.   Hypokalemia: Improved. K+ today is now 3.6. CTM and Repeat CMP in the AM  Hypomagnesemia: Mag Level was 1.6. Replete with IV Mag Sulfate 2 grams. CTM and Replete as Necessary. Repeat Mag Level in the AM    Leukocytosis: Mild and resolved. WBC went from 12.5 -> 8.6 -> 6.2. CTM and Trend and repeat CBC in the AM   Normocytic Anemia: Hgb/Hct went from 13.7/41.2 -> 11.0/31.6 -> 10.3/30.0. Check Anemia Panel in the AM. CTM for S/Sx of Bleeding; No overt bleeding noted. Repeat CBC in the AM   Thrombocytopenia: Mild and Improved. Plt Count went from 137 -> 133 -> 172. CTM and Trend and repeat CBC in the AM  Hypoalbuminemia: Patient's Albumin is now 2.5. CTM and Trend and repeat CMP in the AM   DVT prophylaxis: enoxaparin (LOVENOX) injection 40 mg Start: 01/18/24 0800 SCDs Start: 01/17/24 2343    Code Status: Full Code Family Communication: No family currently at bedside  Disposition Plan:  Level of care: Progressive Status is: Inpatient Remains inpatient appropriate because: Further clinical improvement and clearance   Consultants:  Neurology  Procedures:  As delineated as above  Antimicrobials:  Anti-infectives (From admission,  onward)    None       Subjective: Seen and examined at bedside and he is resting and somnolent and drowsy.  Had just received some pain medication and was very withdrawn and did not want to participate.  Wanted to be left alone case was discussed with the nurse who states the patient was very drowsy  after pain medication administration.  Objective: Vitals:   01/20/24 0738 01/20/24 1139 01/20/24 1529 01/20/24 2014  BP: (!) 153/86 (!) 152/78 (!) 152/95 135/87  Pulse: 60 (!) 54 62 80  Resp: 16 16 17 16   Temp: 98.2 F (36.8 C) 98 F (36.7 C) 98.4 F (36.9 C) 98.4 F (36.9 C)  TempSrc: Oral  Oral Oral  SpO2: 98% 100% 98% 99%  Weight:      Height:        Intake/Output Summary (Last 24 hours) at 01/20/2024 2110 Last data filed at 01/20/2024 1816 Gross per 24 hour  Intake 433.75 ml  Output 1150 ml  Net -716.25 ml   Filed Weights   01/17/24 1548  Weight: 53.1 kg   Examination: Physical Exam:  Constitutional: Thin African-American male in no acute distress resting in his lethargic and somnolent  Respiratory: Diminished to auscultation bilaterally, no wheezing, rales, rhonchi or crackles. Normal respiratory effort and patient is not tachypenic. No accessory muscle use.  Unlabored breathing Cardiovascular: RRR, no murmurs / rubs / gallops. S1 and S2 auscultated. No extremity edema..  Abdomen: Soft, non-tender, mildly tender to palpate.. Bowel sounds positive.  GU: Deferred. Musculoskeletal: No clubbing / cyanosis of digits/nails. No joint deformity upper and lower extremities. Skin: No rashes, lesions, ulcers on a limited skin evaluation. No induration; Warm and dry.  Neurologic: Somnolent and drowsy and withdrawn Psychiatric: Appears calm  Data Reviewed: I have personally reviewed following labs and imaging studies  CBC: Recent Labs  Lab 01/15/24 2014 01/17/24 1559 01/18/24 0545 01/20/24 0656  WBC 17.2* 12.5* 8.6 6.2  NEUTROABS 8.8*  --   --  2.8  HGB 12.4* 13.7 11.0* 10.3*  HCT 37.2* 41.2 31.6* 30.0*  MCV 97.6 97.9 96.3 94.6  PLT 202 137* 133* 172   Basic Metabolic Panel: Recent Labs  Lab 01/15/24 2014 01/17/24 1559 01/18/24 0545 01/19/24 0902 01/20/24 1023  NA 138 135 137 141 142  K 3.2* 4.3 3.2* 4.0 3.6  CL 104 97* 103 106 105  CO2 26 26 27 28 30    GLUCOSE 113* 73 69* 89 115*  BUN 11 14 18 12 9   CREATININE 0.95 0.69 0.63 0.79 0.81  CALCIUM 8.9 9.2 8.5* 8.7* 8.6*  MG  --   --   --   --  1.6*  PHOS  --   --   --   --  3.5   GFR: Estimated Creatinine Clearance: 92 mL/min (by C-G formula based on SCr of 0.81 mg/dL). Liver Function Tests: Recent Labs  Lab 01/15/24 2014 01/17/24 1559 01/20/24 1023  AST 21 22 30   ALT 12 11 14   ALKPHOS 45 55 43  BILITOT 0.7 0.8 0.5  PROT 6.6 7.0 5.1*  ALBUMIN 3.6 3.5 2.5*   Recent Labs  Lab 01/15/24 2014 01/17/24 1559 01/19/24 0902 01/20/24 1023  LIPASE 282* 195* 145* 175*   No results for input(s): "AMMONIA" in the last 168 hours. Coagulation Profile: Recent Labs  Lab 01/18/24 0545  INR 1.1   Cardiac Enzymes: No results for input(s): "CKTOTAL", "CKMB", "CKMBINDEX", "TROPONINI" in the last 168 hours. BNP (last 3 results) No  results for input(s): "PROBNP" in the last 8760 hours. HbA1C: No results for input(s): "HGBA1C" in the last 72 hours. CBG: Recent Labs  Lab 01/16/24 0348  GLUCAP 121*   Lipid Profile: No results for input(s): "CHOL", "HDL", "LDLCALC", "TRIG", "CHOLHDL", "LDLDIRECT" in the last 72 hours. Thyroid Function Tests: No results for input(s): "TSH", "T4TOTAL", "FREET4", "T3FREE", "THYROIDAB" in the last 72 hours. Anemia Panel: No results for input(s): "VITAMINB12", "FOLATE", "FERRITIN", "TIBC", "IRON", "RETICCTPCT" in the last 72 hours. Sepsis Labs: No results for input(s): "PROCALCITON", "LATICACIDVEN" in the last 168 hours.  No results found for this or any previous visit (from the past 240 hours).   Radiology Studies: Overnight EEG with video Result Date: 01/20/2024 Charlsie Quest, MD     01/20/2024  8:37 AM Patient Name: JACARRI GESNER MRN: [id number] Epilepsy Attending: Charlsie Quest Referring Physician/Provider: Milon Dikes, MD Duration: 01/19/2024 2153 to 01/20/2024 0730 Patient history: 38 y.o. male with hx of anxiety, depression, hypertension,  seizures-on Depakote, patient of Dr. Teresa Coombs at [facility], admitted for acute pancreatitis with nausea and vomiting, had back-to-back episodes of seizures while at [facility] where he was receiving treatment for pancreatitis and was recommended to be transferred to [facility]. EEG to evaluate for seizure Level of alertness: Awake, asleep AEDs during EEG study: LEV Technical aspects: This EEG study was done with scalp electrodes positioned according to the 10-20 International system of electrode placement. Electrical activity was reviewed with band pass filter of 1-70Hz , sensitivity of 7 uV/mm, display speed of 73mm/sec with a 60Hz  notched filter applied as appropriate. EEG data were recorded continuously and digitally stored.  Video monitoring was available and reviewed as appropriate. Description: The posterior dominant rhythm consists of 8 Hz activity of moderate voltage (25-35 uV) seen predominantly in posterior head regions, symmetric and reactive to eye opening and eye closing. Sleep was characterized by vertex waves, sleep spindles (12 to 14 Hz), maximal frontocentral region. Frequent spikes were noted in left temporo-parietal region. Hyperventilation and photic stimulation were not performed.   ABNORMALITY -Spike, left temporo-parietal region IMPRESSION: This study is showed evidence of epileptogenicity arising from left temporo-parietal region. No seizures were seen throughout the recording. Priyanka Annabelle Harman   Scheduled Meds:  enoxaparin (LOVENOX) injection  40 mg Subcutaneous Q24H   QUEtiapine  50 mg Oral QHS   sertraline  50 mg Oral Daily   sodium chloride flush  3 mL Intravenous Q12H   Continuous Infusions:  lactated ringers 75 mL/hr at 01/20/24 1910   [START ON 01/21/2024] levETIRAcetam     magnesium sulfate bolus IVPB      LOS: 3 days   Marguerita Merles, DO [facility] Available via Epic secure chat 7am-7pm After these hours, please refer to  coverage provider listed on [email or web address] 01/20/2024, 9:10 PM

## 2024-01-20 NOTE — Progress Notes (Signed)
 [facility] night cross cover note:   I was notified that the patient is refusing tele monitoring this evening.      Newton Pigg, DO Hospitalist

## 2024-01-20 NOTE — Procedures (Signed)
 Patient Name: Maxwell Faulkner  MRN: [id number]  Epilepsy Attending: Charlsie Quest  Referring Physician/Provider: Milon Dikes, MD  Duration: 01/19/2024 2153 to 01/20/2024 1014  Patient history: 38 y.o. male with hx of anxiety, depression, hypertension, seizures-on Depakote, patient of Dr. Teresa Coombs at [facility] neurological [facility], admitted for acute pancreatitis with nausea and vomiting, had back-to-back episodes of seizures while at [facility] where he was receiving treatment for pancreatitis and was recommended to be transferred to [facility]. EEG to evaluate for seizure  Level of alertness: Awake, asleep  AEDs during EEG study: LEV  Technical aspects: This EEG study was done with scalp electrodes positioned according to the 10-20 International system of electrode placement. Electrical activity was reviewed with band pass filter of 1-70Hz , sensitivity of 7 uV/mm, display speed of 26mm/sec with a 60Hz  notched filter applied as appropriate. EEG data were recorded continuously and digitally stored.  Video monitoring was available and reviewed as appropriate.  Description: The posterior dominant rhythm consists of 8 Hz activity of moderate voltage (25-35 uV) seen predominantly in posterior head regions, symmetric and reactive to eye opening and eye closing. Sleep was characterized by vertex waves, sleep spindles (12 to 14 Hz), maximal frontocentral region. Frequent spikes were noted in left temporo-parietal region. Hyperventilation and photic stimulation were not performed.     ABNORMALITY -Spike, left temporo-parietal region  IMPRESSION: This study is showed evidence of epileptogenicity arising from left temporo-parietal region. No seizures were seen throughout the recording.     Suzannah Bettes Annabelle Harman

## 2024-01-20 NOTE — Progress Notes (Signed)
 NEUROLOGY CONSULT FOLLOW UP NOTE   Date of service: January 20, 2024 Patient Name: Maxwell Faulkner MRN:  [id number] DOB:  08/04/1986  Interval Hx/subjective  Lying in bed with family at the bedside.   Vitals   Vitals:   01/19/24 2026 01/19/24 2337 01/20/24 0423 01/20/24 0738  BP: (!) 142/85 (!) 148/88 (!) 145/86 (!) 153/86  Pulse: 66 68 64 60  Resp: 17 13 16 16   Temp: 98.2 F (36.8 C) 97.6 F (36.4 C) 97.8 F (36.6 C) 98.2 F (36.8 C)  TempSrc: Oral Oral Oral Oral  SpO2: 100% 97% 94% 98%  Weight:      Height:         Body mass index is 18.89 kg/m.  Physical Exam   HEENT: [place]/AT. EEG leads in place Eyes: No scleral injection.  Respiratory: Effort normal, non-labored breathing.    Neurologic Examination   Ment: The patient is poorly cooperative. Patient just received opiate medication for his pain. Initially conversational, answering questions and following all commands. He becomes sleepy and stops responding to questions and commands about 10 minutes into the exam CN: Fixates and tracks normally. Face symmetric. Phonation intact.  Motor: Poorly cooperative. Moves BUE spontaneously and purposefully. Wiggles toes to request but otherwise noncooperative with BLE exam.  Sensory: Intact to gross touch x 4 Reflexes: Noncooperative Cerebellar: Noncooperative Gait: Deferred  Medications  Current Facility-Administered Medications:    acetaminophen (TYLENOL) tablet 650 mg, 650 mg, Oral, Q6H PRN, 650 mg at 01/20/24 0828 **OR** acetaminophen (TYLENOL) suppository 650 mg, 650 mg, Rectal, Q6H PRN, Kathlen Mody, MD   enoxaparin (LOVENOX) injection 40 mg, 40 mg, Subcutaneous, Q24H, Blake Divine, Vijaya, MD, 40 mg at 01/20/24 0830   HYDROmorphone (DILAUDID) injection 1 mg, 1 mg, Intravenous, Q3H PRN, Kathlen Mody, MD, 1 mg at 01/20/24 0117   lactated ringers infusion, , Intravenous, Continuous, Kathlen Mody, MD, Last Rate: 75 mL/hr at 01/20/24 0121, New Bag at 01/20/24 0121   levETIRAcetam  (KEPPRA) 2,000 mg in sodium chloride 0.9 % 250 mL IVPB, 2,000 mg, Intravenous, Q12H, Kathlen Mody, MD, Last Rate: 900 mL/hr at 01/20/24 0914, 2,000 mg at 01/20/24 0914   LORazepam (ATIVAN) injection 1-2 mg, 1-2 mg, Intravenous, Q4H PRN, Kathlen Mody, MD, 2 mg at 01/20/24 1191   oxyCODONE (Oxy IR/ROXICODONE) immediate release tablet 5 mg, 5 mg, Oral, Q4H PRN, Kathlen Mody, MD, 5 mg at 01/20/24 4782   polyethylene glycol (MIRALAX / GLYCOLAX) packet 17 g, 17 g, Oral, Daily PRN, Kathlen Mody, MD   QUEtiapine (SEROQUEL) tablet 50 mg, 50 mg, Oral, QHS, Kathlen Mody, MD, 50 mg at 01/19/24 2337   sertraline (ZOLOFT) tablet 50 mg, 50 mg, Oral, Daily, Kathlen Mody, MD, 50 mg at 01/20/24 9562   sodium chloride flush (NS) 0.9 % injection 3 mL, 3 mL, Intravenous, Q12H, Kathlen Mody, MD, 3 mL at 01/20/24 0839  Labs and Diagnostic Imaging   CBC:  Recent Labs  Lab 01/15/24 2014 01/17/24 1559 01/18/24 0545 01/20/24 0656  WBC 17.2*   < > 8.6 6.2  NEUTROABS 8.8*  --   --  2.8  HGB 12.4*   < > 11.0* 10.3*  HCT 37.2*   < > 31.6* 30.0*  MCV 97.6   < > 96.3 94.6  PLT 202   < > 133* 172   < > = values in this interval not displayed.    Basic Metabolic Panel:  Lab Results  Component Value Date   NA 141 01/19/2024   K 4.0 01/19/2024  CO2 28 01/19/2024   GLUCOSE 89 01/19/2024   BUN 12 01/19/2024   CREATININE 0.79 01/19/2024   CALCIUM 8.7 (L) 01/19/2024   GFRNONAA >60 01/19/2024   GFRAA >60 03/29/2019   Lipid Panel:  Lab Results  Component Value Date   LDLCALC 81 01/17/2024   HgbA1c: No results found for: "HGBA1C" Urine Drug Screen:     Component Value Date/Time   LABOPIA POSITIVE (A) 01/16/2024 0450   COCAINSCRNUR NONE DETECTED 01/16/2024 0450   LABBENZ POSITIVE (A) 01/16/2024 0450   AMPHETMU NONE DETECTED 01/16/2024 0450   THCU POSITIVE (A) 01/16/2024 0450   LABBARB NONE DETECTED 01/16/2024 0450    Alcohol Level     Component Value Date/Time   ETH <10 01/16/2024 0330   INR   Lab Results  Component Value Date   INR 1.1 01/18/2024   APTT  Lab Results  Component Value Date   APTT 41 (H) 01/18/2024   AED levels:  Lab Results  Component Value Date   LEVETIRACETA 24.7 01/18/2024      Assessment  38 y.o. 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 Due to his pancreatitis, Depakote was held and he had breakthrough seizures. Transferred to [facility] for LTM EEG hookup to ensure that he does not have electrographic nonconvulsive status epilepticus.  On initial examination yesterday, he did not look like he was seizing. He was hooked up to LTM EEG at 10:15 PM. No reported seizures overnight.  - On exam today, the patient is poorly cooperative. Patient just received opiate medication for his pain. Initially conversational, answering questions and following all commands. He becomes sleepy and stops responding to questions and commands about 10 minutes into the exam.  - LTM EEG report for this morning: Frequent spikes are noted in the left temporo-parietal region. This study showed evidence of epileptogenicity arising from the left temporo-parietal region. No seizures were seen throughout the recording. - Labs: - Keppra level 24.7 on 3/21 (therapeutic) - Na and K normal. Ca slightly low at 8.6, but in the context of low albumin of 2.5.  - AST and ALT normal. Total bilirubin normal.   - BUN and Cr normal.  - Lipase 145 >> 175 today - Vitamin B12 normal.  - WBC normal. - Glucose 115 - Impression: Likely breakthrough seizures in the setting of holding valproate/non absorption of valproate due to pancreatitis and vomiting.   Recommendations  - Discontinuing LTM EEG - Continue off valproic acid indefinitely - Seizure precautions - Decrease Keppra to 1500 mg BID - Will need addition of a second AED (not VPA) if he has further breakthrough seizures  this admission - Neurohospitalist service will follow PRN. Please call if he has a breakthrough seizure.   ______________________________________________________________________   Dessa Phi, Katieann Hungate, MD [facility]

## 2024-01-20 NOTE — Hospital Course (Addendum)
 Patient is a 38 year old African-American male with a past medical history significant for Manson Passey to anxiety, depression, seizures as well as other comorbidities presented the ED for abdominal pain and was admitted for acute pancreatitis.  He was vomiting and was not able to take his seizure medications and subsequently had 2 seizures while hospitalized and transferred to [facility] for LTM EEG and Neuro consultation.  Neurology loaded patient with [place] Range and discontinued his VPA.  Remains on IV fluid hydration and diet had to be reduced from a SOFT diet to FLD given his pain.  Unfortunately the patient also had another breakthrough seizure overnight (3/25) so Neurology was reconsulted and now they have discontinued his Keppra and have initiated the Lacosamide 200 mg IV BID.  Assessment and Plan:  Acute Pancreatitis: Admitted for IV fluids, IV pain control. Bowel rest initiated and now diet has been advanced to SOFT Diet but he still having symptoms now is on a FULL liquid diet.  Counseled significantly about not sneaking food given that continues to have pain and nausea -CT Abd/Pelvs showed pancreatitis with generalized inflamed pancreas and confluent edema ascites throughout the abdomen with free fluid layering in the pelvis and paracolic gutters with no pancreatic necrosis or organized fluid collection or other complicating factors noted. -Lipase levels went from 195 -> 108 today -Given a 1 L normal saline bolus yesterday; Changed IV fluids and added D5 (given hypoglycemia) to the LR and increase the rate to 100 mL/hr. C/w Supportive Care with Antiemetics with IV Ondansetron 4 mg q6hprn.  -C/w Pain Control but adjust medications given patient somnolence and reduce Hydromorphone to 0.5 mg IV q4hprn Severe Pain and Oxycodone to 5 mg po q6hprn Moderate Pain -PT OT to further evaluate and treat given that he is unsteady on his feet and they are recommending outpt PT. -Will leave on a FLD for now given  his pain and Nausea and if improving may consider changing to a SOFT diet     Breakthrough seizure in show of seizure disorder (HCC): -Repeated seizure activity noted 01/19/24. Neurology consulted and Patient was transferred to [facility] for LTM EEG. EEG showed evidence of epileptogenicity arising from the left temporoparietal region.  Initiated on Keppra load with 2 g bolus and and placed on Keppra 2000 mg twice daily and now reduced 1500 mg twice daily.   -Unfortunately he had seizure activity overnight 3/25 and because of this Neurology reconsulted 01/22/2004. -VPA recommended to be held indefinitely and discontinued given the pancreatitis. -Neurology feels  EEG would not change management at this time.  They are recommending stopping the IV Keppra 1500 mg twice daily due to side effects and poor efficacy and starting IV Vimpat 200 mg BID and normalizing electrolytes.  Patient is to follow-up with Dr. Teresa Coombs in outpatient setting and maintain seizure precautions; Neurology will follow PRN but will need to be notified of anymore breakthrough Seizures   Anxiety and Depression: C/w Sertraline 50 mg po qHS and Quetiapine 50 mg qHS   Polysubstance Abuse: UDS is + for THC, opiates and benzodiazepines. Counseling given.   Hypokalemia: Improved. K+ today is now 4.0. CTM and Repeat CMP in the AM  Hypomagnesemia: Mag Level is now 1.5.  Replete with IV mag sulfate 4 g.  CTM and Replete as Necessary. Repeat Mag Level in the AM    Leukocytosis: Mild and resolved. WBC now 5.8. CTM and Trend and repeat CBC in the AM   Normocytic Anemia: Hgb/Hct now 10.4/30.5. Anemia Panel showed an  iron level of 46, UIBC of 252, TIBC of 298, saturation ratios of 15%, ferritin level 125, 406.9 vitamin B12 08 32. CTM for S/Sx of Bleeding; No overt bleeding noted. Repeat CBC in the AM   Thrombocytopenia: Mild and Improved. Plt Count now 188. CTM and Trend and repeat CBC in the AM  Hypoalbuminemia: Patient's Albumin is now 2.3. CTM and  Trend and repeat CMP in the AM

## 2024-01-20 NOTE — Plan of Care (Signed)
 Discussed with the patient the importance of identifying foods that would prevent exacerbation of pancreatitis. We went over his diet and general foods to avoid. We also discussed the importance of weaning off of opioid pain medications. The patient verbally endorsed understanding.  Problem: Education: Goal: Knowledge of General Education information will improve Description: Including pain rating scale, medication(s)/side effects and non-pharmacologic comfort measures Outcome: Progressing   Problem: Health Behavior/Discharge Planning: Goal: Ability to manage health-related needs will improve Outcome: Progressing   Problem: Nutrition: Goal: Adequate nutrition will be maintained Outcome: Progressing   Problem: Elimination: Goal: Will not experience complications related to bowel motility Outcome: Progressing   Problem: Safety: Goal: Ability to remain free from injury will improve Outcome: Progressing

## 2024-01-20 NOTE — Progress Notes (Signed)
 LTM EEG discontinued - no skin breakdown at [facility].

## 2024-01-20 NOTE — Progress Notes (Signed)
 LTM maint complete - no skin breakdown seen  Serviced Fp1 F7 and [facility] monitored, Event button test confirmed by [facility].

## 2024-01-21 DIAGNOSIS — K8500 Idiopathic acute pancreatitis without necrosis or infection: Secondary | ICD-10-CM

## 2024-01-21 DIAGNOSIS — R569 Unspecified convulsions: Secondary | ICD-10-CM | POA: Diagnosis not present

## 2024-01-21 LAB — CBC WITH DIFFERENTIAL/PLATELET
Abs Immature Granulocytes: 0 10*3/uL (ref 0.00–0.07)
Basophils Absolute: 0.1 10*3/uL (ref 0.0–0.1)
Basophils Relative: 2 %
Eosinophils Absolute: 0.3 10*3/uL (ref 0.0–0.5)
Eosinophils Relative: 5 %
HCT: 32.6 % — ABNORMAL LOW (ref 39.0–52.0)
Hemoglobin: 11 g/dL — ABNORMAL LOW (ref 13.0–17.0)
Lymphocytes Relative: 31 %
Lymphs Abs: 1.9 10*3/uL (ref 0.7–4.0)
MCH: 32.3 pg (ref 26.0–34.0)
MCHC: 33.7 g/dL (ref 30.0–36.0)
MCV: 95.6 fL (ref 80.0–100.0)
Monocytes Absolute: 0.4 10*3/uL (ref 0.1–1.0)
Monocytes Relative: 6 %
Neutro Abs: 3.4 10*3/uL (ref 1.7–7.7)
Neutrophils Relative %: 56 %
Platelets: 190 10*3/uL (ref 150–400)
RBC: 3.41 MIL/uL — ABNORMAL LOW (ref 4.22–5.81)
RDW: 13.2 % (ref 11.5–15.5)
WBC: 6 10*3/uL (ref 4.0–10.5)
nRBC: 0 % (ref 0.0–0.2)
nRBC: 0 /100{WBCs}

## 2024-01-21 LAB — COMPREHENSIVE METABOLIC PANEL
ALT: 14 U/L (ref 0–44)
AST: 27 U/L (ref 15–41)
Albumin: 2.9 g/dL — ABNORMAL LOW (ref 3.5–5.0)
Alkaline Phosphatase: 45 U/L (ref 38–126)
Anion gap: 6 (ref 5–15)
BUN: 11 mg/dL (ref 6–20)
CO2: 30 mmol/L (ref 22–32)
Calcium: 8.8 mg/dL — ABNORMAL LOW (ref 8.9–10.3)
Chloride: 105 mmol/L (ref 98–111)
Creatinine, Ser: 0.86 mg/dL (ref 0.61–1.24)
GFR, Estimated: >60 mL/min (ref >60)
Glucose, Bld: 138 mg/dL — ABNORMAL HIGH (ref 70–99)
Potassium: 3.8 mmol/L (ref 3.5–5.1)
Sodium: 141 mmol/L (ref 135–145)
Total Bilirubin: 0.4 mg/dL (ref 0.0–1.2)
Total Protein: 5.8 g/dL — ABNORMAL LOW (ref 6.5–8.1)

## 2024-01-21 LAB — LIPASE, BLOOD: Lipase: 181 U/L — ABNORMAL HIGH (ref 11–51)

## 2024-01-21 LAB — IRON AND TIBC
Iron: 46 ug/dL (ref 45–182)
Saturation Ratios: 15 % — ABNORMAL LOW (ref 17.9–39.5)
TIBC: 298 ug/dL (ref 250–450)
UIBC: 252 ug/dL

## 2024-01-21 LAB — PHOSPHORUS: Phosphorus: 3.6 mg/dL (ref 2.5–4.6)

## 2024-01-21 LAB — RETICULOCYTES
Immature Retic Fract: 7.4 % (ref 2.3–15.9)
RBC.: 3.42 MIL/uL — ABNORMAL LOW (ref 4.22–5.81)
Retic Count, Absolute: 26.7 10*3/uL (ref 19.0–186.0)
Retic Ct Pct: 0.8 % (ref 0.4–3.1)

## 2024-01-21 LAB — GLUCOSE, CAPILLARY: Glucose-Capillary: 88 mg/dL (ref 70–99)

## 2024-01-21 LAB — FOLATE: Folate: 6.9 ng/mL (ref >5.9)

## 2024-01-21 LAB — MAGNESIUM: Magnesium: 2.3 mg/dL (ref 1.7–2.4)

## 2024-01-21 LAB — FERRITIN: Ferritin: 125 ng/mL (ref 24–336)

## 2024-01-21 LAB — VITAMIN B12: Vitamin B-12: 832 pg/mL (ref 180–914)

## 2024-01-21 MED ORDER — SODIUM CHLORIDE 0.9 % IV BOLUS
1000.0000 mL | Freq: Once | INTRAVENOUS | Status: AC
  Administered 2024-01-21: 1000 mL via INTRAVENOUS

## 2024-01-21 NOTE — Progress Notes (Signed)
 PROGRESS NOTE    Maxwell Faulkner  ZOX:[id number] DOB: 1985/11/30 DOA: 01/17/2024 PCP: Avanell Shackleton, NP-C   Brief Narrative:  Patient is a 38 year old African-American male with a past medical history significant for Manson Passey to anxiety, depression, seizures as well as other comorbidities presented the ED for abdominal pain and was admitted for acute pancreatitis.  He was vomiting and was not able to take his seizure medications and subsequently had 2 seizures transferred to [facility] for LTM EEG and neurology consultation.  Neurology loaded patient with Keppra and discontinue his VPA.  He is getting IV fluid hydration and pain was improving but worsened after he started eating so we have cut back his diet from soft to FULL liquid diet.  Will continue IV fluid hydration for now and given a 1 L bolus today.    Assessment and Plan:  Acute Pancreatitis: Admitted for IV fluids, IV pain control. Bowel rest initiated and now diet has been advanced to SOFT Diet but he still having symptoms so we will go back to full liquid diet.  Patient wants to eat something now but continues have significant abdominal discomfort whenever he eats commending continuing full liquid diet for now given his continued pain. -Abdominal pain was worse today. Nausea better, no vomiting.  -CT Abd/Pelvs showed pancreatitis with generalized inflamed pancreas and confluent edema ascites throughout the abdomen with free fluid layering in the pelvis and paracolic gutters with no pancreatic necrosis or organized fluid collection or other complicating factors noted. -Lipase levels went from 195 -> 145 -> 175 -> 181 -C/w IVF with LR @ 75 mL/hr; Give 1 Liter NS Bolus and C/w Supportive Care with Antiemetics with IV Ondansetron 4 mg q6hprn. C/w Pain Control but adjust medications given patient somnolence and reduce Hydromorphone to 0.5 mg IV q4hprn Severe Pain and Oxycodone to 5 mg po q6hprn Moderate Pain -PT OT to further evaluate and treat  given that he is unsteady on his feet   Breakthrough seizure in show of seizure disorder (HCC): -Repeated seizure activity noted 01/19/24. Neurology consulted and Patient was transferred to [facility] for LTM EEG. EEG showed evidence of epileptogenicity arising from the left temporoparietal region.  Initiated on Keppra load with 2 g bolus and and placed on Keppra 2000 mg twice daily and now reduced 1500 mg twice daily.  Neurology recommends addition of a second AED and preferably not VPA if he has further breakthrough seizures since admission.  Neurology felt that the seizure was in setting of holding his valproate and nonexertional.  Due to the pancreatitis and vomiting. -VPA recommended to be held indefinitely and discontinued given the pancreatitis.   Anxiety and Depression: C/w Sertraline 50 mg po qHS and Quetiapine 50 mg qHS   Polysubstance Abuse: UDS is + for THC, opiates and benzodiazepines. Counseling given.   Hypokalemia: Improved. K+ today is now 3.8. CTM and Repeat CMP in the AM  Hypomagnesemia: Mag Level is now 2.3. CTM and Replete as Necessary. Repeat Mag Level in the AM    Leukocytosis: Mild and resolved. WBC went from 12.5 -> 8.6 -> 6.2 -> 6.0. CTM and Trend and repeat CBC in the AM   Normocytic Anemia: Hgb/Hct went from 13.7/41.2 -> 11.0/31.6 -> 10.3/30.0 -> 11.0/32.6. Check Anemia Panel in the AM. CTM for S/Sx of Bleeding; No overt bleeding noted. Repeat CBC in the AM   Thrombocytopenia: Mild and Improved. Plt Count went from 137 -> 133 -> 172 -> 190. CTM and Trend and repeat CBC  in the AM  Hypoalbuminemia: Patient's Albumin is now 2.9. CTM and Trend and repeat CMP in the AM   DVT prophylaxis: enoxaparin (LOVENOX) injection 40 mg Start: 01/18/24 0800 SCDs Start: 01/17/24 2343    Code Status: Full Code Family Communication: No family currently at bedside  Disposition Plan:  Level of care: Progressive Status is: Inpatient Remains inpatient appropriate because: His further clinical  improvement given that he continues to have abdominal discomfort   Consultants:  Neurology  Procedures:  As delineated as above  Antimicrobials:  Anti-infectives (From admission, onward)    None       Subjective: Seen and examined at bedside and continues to have some abdominal discomfort whenever he eats.  No nausea or vomiting currently but the pain is still there.  Has been unsteady on his feet.  No other concerns or complaints at this time but little agitated that his diet will be cut back to FULL.  Objective: Vitals:   01/21/24 0407 01/21/24 0714 01/21/24 1145 01/21/24 1525  BP: (!) 162/93 (!) 155/89 (!) 153/95 (!) 171/92  Pulse: (!) 56 (!) 58 65 (!) 51  Resp: 18 17 17 17   Temp: 98.3 F (36.8 C) 98.2 F (36.8 C) 98 F (36.7 C) 98.3 F (36.8 C)  TempSrc: Oral Oral Oral Oral  SpO2: 99% 97% 100% 100%  Weight:      Height:        Intake/Output Summary (Last 24 hours) at 01/21/2024 1856 Last data filed at 01/21/2024 1804 Gross per 24 hour  Intake 2826.74 ml  Output 2300 ml  Net 526.74 ml   Filed Weights   01/17/24 1548  Weight: 53.1 kg   Examination: Physical Exam:  Constitutional: Thin AAM who appears uncomfortable laying in the bed Respiratory: Diminished to auscultation bilaterally, no wheezing, rales, rhonchi or crackles. Normal respiratory effort and patient is not tachypenic. No accessory muscle use. Unlabored breathing  Cardiovascular: RRR, no murmurs / rubs / gallops. S1 and S2 auscultated. No extremity edema.  Abdomen: Soft, tender to palpate but is not distended.  Bowel sounds positive.  GU: Deferred. Musculoskeletal: No clubbing / cyanosis of digits/nails. No joint deformity upper and lower extremities.  Skin: No rashes, lesions, ulcers on limited skin evaluation. No induration; Warm and dry.  Neurologic: CN 2-12 grossly intact with no focal deficits. Romberg sign and cerebellar reflexes not assessed.  Psychiatric: He is awake and alert  Data  Reviewed: I have personally reviewed following labs and imaging studies  CBC: Recent Labs  Lab 01/15/24 2014 01/17/24 1559 01/18/24 0545 01/20/24 0656 01/21/24 0750  WBC 17.2* 12.5* 8.6 6.2 6.0  NEUTROABS 8.8*  --   --  2.8 3.4  HGB 12.4* 13.7 11.0* 10.3* 11.0*  HCT 37.2* 41.2 31.6* 30.0* 32.6*  MCV 97.6 97.9 96.3 94.6 95.6  PLT 202 137* 133* 172 190   Basic Metabolic Panel: Recent Labs  Lab 01/18/24 0545 01/19/24 0902 01/20/24 0656 01/20/24 1023 01/21/24 0750  NA 137 141 139 142 141  K 3.2* 4.0 3.7 3.6 3.8  CL 103 106 105 105 105  CO2 27 28 31 30 30   GLUCOSE 69* 89 92 115* 138*  BUN 18 12 8 9 11   CREATININE 0.63 0.79 0.77 0.81 0.86  CALCIUM 8.5* 8.7* 8.4* 8.6* 8.8*  MG  --   --   --  1.6* 2.3  PHOS  --   --   --  3.5 3.6   GFR: Estimated Creatinine Clearance: 86.6 mL/min (by C-G  formula based on SCr of 0.86 mg/dL). Liver Function Tests: Recent Labs  Lab 01/15/24 2014 01/17/24 1559 01/20/24 0656 01/20/24 1023 01/21/24 0750  AST 21 22 32 30 27  ALT 12 11 15 14 14   ALKPHOS 45 55 42 43 45  BILITOT 0.7 0.8 0.4 0.5 0.4  PROT 6.6 7.0 5.2* 5.1* 5.8*  ALBUMIN 3.6 3.5 2.6* 2.5* 2.9*   Recent Labs  Lab 01/17/24 1559 01/19/24 0902 01/20/24 0656 01/20/24 1023 01/21/24 0750  LIPASE 195* 145* 131* 175* 181*   No results for input(s): "AMMONIA" in the last 168 hours. Coagulation Profile: Recent Labs  Lab 01/18/24 0545  INR 1.1   Cardiac Enzymes: No results for input(s): "CKTOTAL", "CKMB", "CKMBINDEX", "TROPONINI" in the last 168 hours. BNP (last 3 results) No results for input(s): "PROBNP" in the last 8760 hours. HbA1C: No results for input(s): "HGBA1C" in the last 72 hours. CBG: Recent Labs  Lab 01/16/24 0348  GLUCAP 121*   Lipid Profile: No results for input(s): "CHOL", "HDL", "LDLCALC", "TRIG", "CHOLHDL", "LDLDIRECT" in the last 72 hours. Thyroid Function Tests: No results for input(s): "TSH", "T4TOTAL", "FREET4", "T3FREE", "THYROIDAB" in the  last 72 hours. Anemia Panel: Recent Labs    01/21/24 0750  VITAMINB12 832  FOLATE 6.9  FERRITIN 125  TIBC 298  IRON 46  RETICCTPCT 0.8   Sepsis Labs: No results for input(s): "PROCALCITON", "LATICACIDVEN" in the last 168 hours.  No results found for this or any previous visit (from the past 240 hours).   Radiology Studies: Overnight EEG with video Result Date: 01/20/2024 Charlsie Quest, MD     01/21/2024  9:27 AM Patient Name: Maxwell Faulkner MRN: [id number] Epilepsy Attending: Charlsie Quest Referring Physician/Provider: Milon Dikes, MD Duration: 01/19/2024 2153 to 01/20/2024 1014 Patient history: 38 y.o. male with hx of anxiety, depression, hypertension, seizures-on Depakote, patient of Dr. Teresa Coombs at [facility], admitted for acute pancreatitis with nausea and vomiting, had back-to-back episodes of seizures while at [facility] where he was receiving treatment for pancreatitis and was recommended to be transferred to [facility]. EEG to evaluate for seizure Level of alertness: Awake, asleep AEDs during EEG study: LEV Technical aspects: This EEG study was done with scalp electrodes positioned according to the 10-20 International system of electrode placement. Electrical activity was reviewed with band pass filter of 1-70Hz , sensitivity of 7 uV/mm, display speed of 17mm/sec with a 60Hz  notched filter applied as appropriate. EEG data were recorded continuously and digitally stored.  Video monitoring was available and reviewed as appropriate. Description: The posterior dominant rhythm consists of 8 Hz activity of moderate voltage (25-35 uV) seen predominantly in posterior head regions, symmetric and reactive to eye opening and eye closing. Sleep was characterized by vertex waves, sleep spindles (12 to 14 Hz), maximal frontocentral region. Frequent spikes were noted in left temporo-parietal region. Hyperventilation and photic stimulation were not performed.    ABNORMALITY -Spike, left temporo-parietal region IMPRESSION: This study is showed evidence of epileptogenicity arising from left temporo-parietal region. No seizures were seen throughout the recording. Priyanka Annabelle Harman   Scheduled Meds:  enoxaparin (LOVENOX) injection  40 mg Subcutaneous Q24H   QUEtiapine  50 mg Oral QHS   sertraline  50 mg Oral Daily   sodium chloride flush  3 mL Intravenous Q12H   Continuous Infusions:  lactated ringers 75 mL/hr at 01/21/24 1804   levETIRAcetam Stopped (01/21/24 0816)    LOS: 4 days   Marguerita Merles, DO [facility] Available  via Epic secure chat 7am-7pm After these hours, please refer to coverage provider listed on [email or web address] 01/21/2024, 6:56 PM

## 2024-01-21 NOTE — TOC Initial Note (Signed)
 Transition of Care [facility]) - Initial/Assessment Note    Patient Details  Name: Maxwell Faulkner MRN: [id number] Date of Birth: 10-Aug-1986  Transition of Care [facility]) CM/SW Contact:    Kermit Balo, RN Phone Number: 01/21/2024, 1:57 PM  Clinical Narrative:                  Pt is complaining about not having food. CM has updated MD. Pt lives at home with his mother and adult son.  No DME at home.  Pt denies any issues with home medications. Son and mom provide needed transportation. Therapy to eval. TOC following.  Expected Discharge Plan: Home/Self Care Barriers to Discharge: Continued Medical Work up   Patient Goals and CMS Choice            Expected Discharge Plan and Services   Discharge Planning Services: CM Consult   Living arrangements for the past 2 months: Single Family Home                                      Prior Living Arrangements/Services Living arrangements for the past 2 months: Single Family Home Lives with:: Adult Children, Parents Patient language and need for interpreter reviewed:: Yes Do you feel safe going back to the place where you live?: Yes        Care giver support system in place?: Yes (comment)   Criminal Activity/Legal Involvement Pertinent to Current Situation/Hospitalization: No - Comment as needed  Activities of Daily Living   ADL Screening (condition at time of admission) Independently performs ADLs?: Yes (appropriate for developmental age) Is the patient deaf or have difficulty hearing?: No Does the patient have difficulty seeing, even when wearing glasses/contacts?: No Does the patient have difficulty concentrating, remembering, or making decisions?: No  Permission Sought/Granted                  Emotional Assessment Appearance:: Appears stated age Attitude/Demeanor/Rapport: Guarded Affect (typically observed): Frustrated Orientation: : Oriented to Self, Oriented to Place, Oriented to  Time, Oriented to  Situation   Psych Involvement: No (comment)  Admission diagnosis:  Acute pancreatitis [K85.90] Idiopathic acute pancreatitis, unspecified complication status [K85.00] Patient Active Problem List   Diagnosis Date Noted   Pancreatitis 01/17/2024   Seizures (HCC) 01/17/2024   Acute pancreatitis 01/17/2024   Abnormal CT scan, gallbladder 01/11/2024   BRBPR (bright red blood per rectum) 01/11/2024   Right upper quadrant abdominal tenderness 01/11/2024   History of colitis 01/11/2024   Aortic atherosclerosis (HCC) 01/11/2024   Anemia 01/11/2024   Right lower quadrant abdominal tenderness without rebound tenderness 01/11/2024   Generalized abdominal pain 01/11/2024   Pulmonary emphysema (HCC) 01/11/2024   PCP:  Avanell Shackleton, NP-C Pharmacy:   [facility] [phone] [place], [place] - [address] AT [address] [place] [address] Phone: [phone] Fax: [phone]  [facility] [place], [place] - [address] [place] [address] Phone: [phone] Fax: [phone]  [id number] [place], [place] - [address] AT [facility] OF [address] [place] [address] Phone: [phone] Fax: [phone]  [facility] - [facility] [address], [address] [place] [address] Phone: [phone] Fax: [phone]     Social Drivers of Health (SDOH) Social History: SDOH Screenings  Food Insecurity: No Food Insecurity (01/18/2024)  Housing: Low Risk  (01/18/2024)  Transportation Needs: No Transportation Needs (01/18/2024)  Utilities: Not At Risk (01/18/2024)  Depression (PHQ2-9): Medium Risk (01/11/2024)  Social Connections: Patient Declined (01/18/2024)  Tobacco Use: High Risk (01/17/2024)   SDOH Interventions:     Readmission Risk Interventions     No data to display

## 2024-01-21 NOTE — Plan of Care (Signed)

## 2024-01-22 DIAGNOSIS — R188 Other ascites: Secondary | ICD-10-CM | POA: Diagnosis not present

## 2024-01-22 DIAGNOSIS — K853 Drug induced acute pancreatitis without necrosis or infection: Secondary | ICD-10-CM

## 2024-01-22 DIAGNOSIS — G40909 Epilepsy, unspecified, not intractable, without status epilepticus: Secondary | ICD-10-CM | POA: Diagnosis not present

## 2024-01-22 DIAGNOSIS — D649 Anemia, unspecified: Secondary | ICD-10-CM | POA: Diagnosis not present

## 2024-01-22 DIAGNOSIS — E8809 Other disorders of plasma-protein metabolism, not elsewhere classified: Secondary | ICD-10-CM | POA: Diagnosis not present

## 2024-01-22 DIAGNOSIS — R569 Unspecified convulsions: Secondary | ICD-10-CM | POA: Diagnosis not present

## 2024-01-22 DIAGNOSIS — I10 Essential (primary) hypertension: Secondary | ICD-10-CM | POA: Diagnosis not present

## 2024-01-22 DIAGNOSIS — K8500 Idiopathic acute pancreatitis without necrosis or infection: Secondary | ICD-10-CM | POA: Diagnosis not present

## 2024-01-22 DIAGNOSIS — D696 Thrombocytopenia, unspecified: Secondary | ICD-10-CM | POA: Diagnosis not present

## 2024-01-22 DIAGNOSIS — F32A Depression, unspecified: Secondary | ICD-10-CM | POA: Diagnosis not present

## 2024-01-22 LAB — COMPREHENSIVE METABOLIC PANEL
ALT: 14 U/L (ref 0–44)
AST: 22 U/L (ref 15–41)
Albumin: 2.3 g/dL — ABNORMAL LOW (ref 3.5–5.0)
Alkaline Phosphatase: 36 U/L — ABNORMAL LOW (ref 38–126)
Anion gap: 7 (ref 5–15)
BUN: 6 mg/dL (ref 6–20)
CO2: 24 mmol/L (ref 22–32)
Calcium: 8.2 mg/dL — ABNORMAL LOW (ref 8.9–10.3)
Chloride: 108 mmol/L (ref 98–111)
Creatinine, Ser: 0.69 mg/dL (ref 0.61–1.24)
GFR, Estimated: >60 mL/min (ref >60)
Glucose, Bld: 64 mg/dL — ABNORMAL LOW (ref 70–99)
Potassium: 4 mmol/L (ref 3.5–5.1)
Sodium: 139 mmol/L (ref 135–145)
Total Bilirubin: 0.4 mg/dL (ref 0.0–1.2)
Total Protein: 4.6 g/dL — ABNORMAL LOW (ref 6.5–8.1)

## 2024-01-22 LAB — CBC WITH DIFFERENTIAL/PLATELET
Abs Immature Granulocytes: 0 10*3/uL (ref 0.00–0.07)
Basophils Absolute: 0.1 10*3/uL (ref 0.0–0.1)
Basophils Relative: 2 %
Eosinophils Absolute: 0.5 10*3/uL (ref 0.0–0.5)
Eosinophils Relative: 8 %
HCT: 30.5 % — ABNORMAL LOW (ref 39.0–52.0)
Hemoglobin: 10.4 g/dL — ABNORMAL LOW (ref 13.0–17.0)
Lymphocytes Relative: 23 %
Lymphs Abs: 1.3 10*3/uL (ref 0.7–4.0)
MCH: 32.3 pg (ref 26.0–34.0)
MCHC: 34.1 g/dL (ref 30.0–36.0)
MCV: 94.7 fL (ref 80.0–100.0)
Monocytes Absolute: 0.2 10*3/uL (ref 0.1–1.0)
Monocytes Relative: 4 %
Neutro Abs: 3.7 10*3/uL (ref 1.7–7.7)
Neutrophils Relative %: 63 %
Platelets: 188 10*3/uL (ref 150–400)
RBC: 3.22 MIL/uL — ABNORMAL LOW (ref 4.22–5.81)
RDW: 13.2 % (ref 11.5–15.5)
WBC: 5.8 10*3/uL (ref 4.0–10.5)
nRBC: 0 % (ref 0.0–0.2)
nRBC: 0 /100{WBCs}

## 2024-01-22 LAB — PHOSPHORUS: Phosphorus: 3.3 mg/dL (ref 2.5–4.6)

## 2024-01-22 LAB — LIPASE, BLOOD: Lipase: 108 U/L — ABNORMAL HIGH (ref 11–51)

## 2024-01-22 LAB — MAGNESIUM: Magnesium: 1.5 mg/dL — ABNORMAL LOW (ref 1.7–2.4)

## 2024-01-22 MED ORDER — KCL-LACTATED RINGERS-D5W 20 MEQ/L IV SOLN
INTRAVENOUS | Status: AC
  Filled 2024-01-22 (×3): qty 1000

## 2024-01-22 MED ORDER — SODIUM CHLORIDE 0.9 % IV SOLN
100.0000 mg | Freq: Once | INTRAVENOUS | Status: AC
  Administered 2024-01-22: 100 mg via INTRAVENOUS
  Filled 2024-01-22: qty 10

## 2024-01-22 MED ORDER — MAGNESIUM SULFATE 4 GM/100ML IV SOLN
4.0000 g | Freq: Once | INTRAVENOUS | Status: AC
  Administered 2024-01-22: 4 g via INTRAVENOUS
  Filled 2024-01-22: qty 100

## 2024-01-22 MED ORDER — SODIUM CHLORIDE 0.9 % IV SOLN
200.0000 mg | Freq: Two times a day (BID) | INTRAVENOUS | Status: DC
  Administered 2024-01-22: 200 mg via INTRAVENOUS
  Filled 2024-01-22 (×4): qty 20

## 2024-01-22 NOTE — Progress Notes (Addendum)
 PROGRESS NOTE    Maxwell Faulkner  ZOX:[id number] DOB: Mar 04, 1986 DOA: 01/17/2024 PCP: Avanell Shackleton, NP-C   Brief Narrative:  Patient is a 38 year old African-American male with a past medical history significant for Manson Passey to anxiety, depression, seizures as well as other comorbidities presented the ED for abdominal pain and was admitted for acute pancreatitis.  He was vomiting and was not able to take his seizure medications and subsequently had 2 seizures while hospitalized and transferred to [facility] for LTM EEG and Neuro consultation.  Neurology loaded patient with Nell Range and discontinued his VPA.  Remains on IV fluid hydration and diet had to be reduced from a SOFT diet to FLD given his pain.  Unfortunately the patient also had another breakthrough seizure overnight (3/25) so Neurology was reconsulted and now they have discontinued his Keppra and have initiated the Lacosamide 200 mg IV BID.  Assessment and Plan:  Acute Pancreatitis: Admitted for IV fluids, IV pain control. Bowel rest initiated and now diet has been advanced to SOFT Diet but he still having symptoms now is on a FULL liquid diet.  Counseled significantly about not sneaking food given that continues to have pain and nausea -CT Abd/Pelvs showed pancreatitis with generalized inflamed pancreas and confluent edema ascites throughout the abdomen with free fluid layering in the pelvis and paracolic gutters with no pancreatic necrosis or organized fluid collection or other complicating factors noted. -Lipase levels went from 195 -> 108 today -Given a 1 L normal saline bolus yesterday; Changed IV fluids and added D5 (given hypoglycemia) to the LR and increase the rate to 100 mL/hr. C/w Supportive Care with Antiemetics with IV Ondansetron 4 mg q6hprn.  -C/w Pain Control but adjust medications given patient somnolence and reduce Hydromorphone to 0.5 mg IV q4hprn Severe Pain and Oxycodone to 5 mg po q6hprn Moderate Pain -PT OT to  further evaluate and treat given that he is unsteady on his feet and they are recommending outpt PT. -Will leave on a FLD for now given his pain and Nausea and if improving may consider changing to a SOFT diet     Breakthrough seizure in show of seizure disorder (HCC): -Repeated seizure activity noted 01/19/24. Neurology consulted and Patient was transferred to [facility] for LTM EEG. EEG showed evidence of epileptogenicity arising from the left temporoparietal region.  Initiated on Keppra load with 2 g bolus and and placed on Keppra 2000 mg twice daily and now reduced 1500 mg twice daily.   -Unfortunately he had seizure activity overnight 3/25 and because of this Neurology reconsulted 01/22/2004. -VPA recommended to be held indefinitely and discontinued given the pancreatitis. -Neurology feels  EEG would not change management at this time.  They are recommending stopping the IV Keppra 1500 mg twice daily due to side effects and poor efficacy and starting IV Vimpat 200 mg BID and normalizing electrolytes.  Patient is to follow-up with Dr. Teresa Coombs in outpatient setting and maintain seizure precautions; Neurology will follow PRN but will need to be notified of anymore breakthrough Seizures   Anxiety and Depression: C/w Sertraline 50 mg po qHS and Quetiapine 50 mg qHS   Polysubstance Abuse: UDS is + for THC, opiates and benzodiazepines. Counseling given.   Hypokalemia: Improved. K+ today is now 4.0. CTM and Repeat CMP in the AM  Hypomagnesemia: Mag Level is now 1.5.  Replete with IV mag sulfate 4 g.  CTM and Replete as Necessary. Repeat Mag Level in the AM    Leukocytosis: Mild and  resolved. WBC now 5.8. CTM and Trend and repeat CBC in the AM   Normocytic Anemia: Hgb/Hct now 10.4/30.5. Anemia Panel showed an iron level of 46, UIBC of 252, TIBC of 298, saturation ratios of 15%, ferritin level 125, 406.9 vitamin B12 08 32. CTM for S/Sx of Bleeding; No overt bleeding noted. Repeat CBC in the AM    Thrombocytopenia: Mild and Improved. Plt Count now 188. CTM and Trend and repeat CBC in the AM  Hypoalbuminemia: Patient's Albumin is now 2.3. CTM and Trend and repeat CMP in the AM   DVT prophylaxis: enoxaparin (LOVENOX) injection 40 mg Start: 01/18/24 0800 SCDs Start: 01/17/24 2343    Code Status: Full Code Family Communication: No family currently at bedside  Disposition Plan:  Level of care: Progressive Status is: Inpatient Remains inpatient appropriate because: Needs further clinical improvement in his abdominal pain and ensure that he is seizure-free prior to discharging   Consultants:  Neurology  Procedures:  As delineated as above  Antimicrobials:  Anti-infectives (From admission, onward)    None       Subjective: Seen and examined at bedside and he was complaining of still having some abdominal pain which was a 10 out of 10 in severity especially when he eats.  Recommended to stay on a full liquid diet and recommended him not sneak any food.  Unfortunately had another seizure last night which was not very long-lasting so neurology was reconsulted and have not changed his antiepileptics.  Discussed with him that if his pain improves can likely go to a soft diet.  Objective: Vitals:   01/22/24 0407 01/22/24 0833 01/22/24 1244 01/22/24 1555  BP: 124/75 (!) 169/93 123/80 113/72  Pulse: (!) 54 (!) 51  (!) 58  Resp: 18 18 18 18   Temp: 97.7 F (36.5 C) 98.1 F (36.7 C) 98.1 F (36.7 C) 98.8 F (37.1 C)  TempSrc: Oral Oral Oral   SpO2: 99% 100% 100% 99%  Weight:      Height:        Intake/Output Summary (Last 24 hours) at 01/22/2024 1636 Last data filed at 01/22/2024 1411 Gross per 24 hour  Intake 6143.06 ml  Output 750 ml  Net 5393.06 ml   Filed Weights   01/17/24 1548  Weight: 53.1 kg   Examination: Physical Exam:  Constitutional: Thin African-American male who appears a little uncomfortable seen in the chair at bedside complaining of abdominal  pain Respiratory: Diminished to auscultation bilaterally, no wheezing, rales, rhonchi or crackles. Normal respiratory effort and patient is not tachypenic. No accessory muscle use.  Unlabored breathing Cardiovascular: RRR, no murmurs / rubs / gallops. S1 and S2 auscultated. No extremity edema.  Abdomen: Soft, tender to palpate but not distended.. Bowel sounds positive.  GU: Deferred. Musculoskeletal: No clubbing / cyanosis of digits/nails. No joint deformity upper and lower extremities. Skin: No rashes, lesions, ulcers on limited skin evaluation. No induration; Warm and dry.  Neurologic: CN 2-12 grossly intact with no focal deficits. Romberg sign and cerebellar reflexes not assessed.  Psychiatric: Normal judgment and insight. Alert and oriented x 3. Normal mood and appropriate affect.   Data Reviewed: I have personally reviewed following labs and imaging studies  CBC: Recent Labs  Lab 01/15/24 2014 01/17/24 1559 01/18/24 0545 01/20/24 0656 01/21/24 0750 01/22/24 0541  WBC 17.2* 12.5* 8.6 6.2 6.0 5.8  NEUTROABS 8.8*  --   --  2.8 3.4 3.7  HGB 12.4* 13.7 11.0* 10.3* 11.0* 10.4*  HCT 37.2* 41.2 31.6* 30.0*  32.6* 30.5*  MCV 97.6 97.9 96.3 94.6 95.6 94.7  PLT 202 137* 133* 172 190 188   Basic Metabolic Panel: Recent Labs  Lab 01/19/24 0902 01/20/24 0656 01/20/24 1023 01/21/24 0750 01/22/24 0541  NA 141 139 142 141 139  K 4.0 3.7 3.6 3.8 4.0  CL 106 105 105 105 108  CO2 28 31 30 30 24   GLUCOSE 89 92 115* 138* 64*  BUN 12 8 9 11 6   CREATININE 0.79 0.77 0.81 0.86 0.69  CALCIUM 8.7* 8.4* 8.6* 8.8* 8.2*  MG  --   --  1.6* 2.3 1.5*  PHOS  --   --  3.5 3.6 3.3   GFR: Estimated Creatinine Clearance: 93.1 mL/min (by C-G formula based on SCr of 0.69 mg/dL). Liver Function Tests: Recent Labs  Lab 01/17/24 1559 01/20/24 0656 01/20/24 1023 01/21/24 0750 01/22/24 0541  AST 22 32 30 27 22   ALT 11 15 14 14 14   ALKPHOS 55 42 43 45 36*  BILITOT 0.8 0.4 0.5 0.4 0.4  PROT 7.0 5.2*  5.1* 5.8* 4.6*  ALBUMIN 3.5 2.6* 2.5* 2.9* 2.3*   Recent Labs  Lab 01/19/24 0902 01/20/24 0656 01/20/24 1023 01/21/24 0750 01/22/24 0541  LIPASE 145* 131* 175* 181* 108*   No results for input(s): "AMMONIA" in the last 168 hours. Coagulation Profile: Recent Labs  Lab 01/18/24 0545  INR 1.1   Cardiac Enzymes: No results for input(s): "CKTOTAL", "CKMB", "CKMBINDEX", "TROPONINI" in the last 168 hours. BNP (last 3 results) No results for input(s): "PROBNP" in the last 8760 hours. HbA1C: No results for input(s): "HGBA1C" in the last 72 hours. CBG: Recent Labs  Lab 01/16/24 0348 01/21/24 2159  GLUCAP 121* 88   Lipid Profile: No results for input(s): "CHOL", "HDL", "LDLCALC", "TRIG", "CHOLHDL", "LDLDIRECT" in the last 72 hours. Thyroid Function Tests: No results for input(s): "TSH", "T4TOTAL", "FREET4", "T3FREE", "THYROIDAB" in the last 72 hours. Anemia Panel: Recent Labs    01/21/24 0750  VITAMINB12 832  FOLATE 6.9  FERRITIN 125  TIBC 298  IRON 46  RETICCTPCT 0.8   Sepsis Labs: No results for input(s): "PROCALCITON", "LATICACIDVEN" in the last 168 hours.  No results found for this or any previous visit (from the past 240 hours).   Radiology Studies: No results found.  Scheduled Meds:  enoxaparin (LOVENOX) injection  40 mg Subcutaneous Q24H   QUEtiapine  50 mg Oral QHS   sertraline  50 mg Oral Daily   sodium chloride flush  3 mL Intravenous Q12H   Continuous Infusions:  dextrose 5% lactated ringers with KCl 20 mEq/L 100 mL/hr at 01/22/24 1411   lacosamide (VIMPAT) IV      LOS: 5 days   Marguerita Merles, DO [facility] Available via Epic secure chat 7am-7pm After these hours, please refer to coverage provider listed on [email or web address] 01/22/2024, 4:36 PM

## 2024-01-22 NOTE — Evaluation (Signed)
 Physical Therapy Evaluation Patient Details Name: Maxwell Faulkner MRN: [id number] DOB: December 27, 1985 Today's Date: 01/22/2024  History of Present Illness  Patient is a 38 y/o male admitted 01/17/24 after 3 back to back seizures, abdominal pain with N&V diagnosed with pancreatitis at [facility] ER and sent home on Percocet.  PMH positive for anxiety, depression, HTN, seizures and tobacco and marijuana use.  Clinical Impression  Patient presents with decreased mobility due to generalized weakness, decreased balance, decreased activity tolerance.  Reports months of limited mobility at home with sleeping a lot and having family assist him.  States all the meds make him sleep.  Discussed plan to speak with MD about meds, have dietician help with planning more appropriate diet for maximizing calories/protein when he does eat and for outpatient PT to progress mobility/strength and balance.  PT will continue to follow in the acute setting.  Encouraged ambulation with nursing assist.         If plan is discharge home, recommend the following: A little help with walking and/or transfers   Can travel by private vehicle        Equipment Recommendations Cane  Recommendations for Other Services       Functional Status Assessment Patient has had a recent decline in their functional status and demonstrates the ability to make significant improvements in function in a reasonable and predictable amount of time.     Precautions / Restrictions Precautions Precautions: Fall Precaution/Restrictions Comments: seizure      Mobility  Bed Mobility Overal bed mobility: Modified Independent                  Transfers Overall transfer level: Needs assistance   Transfers: Sit to/from Stand Sit to Stand: Supervision           General transfer comment: assist for safety    Ambulation/Gait Ambulation/Gait assistance: Contact guard assist, Supervision, Min assist Gait Distance (Feet): 250 Feet Assistive  device: None, Straight cane Gait Pattern/deviations: Step-through pattern, Decreased stride length, Trunk flexed, Shuffle, Drifts right/left       General Gait Details: decreased foot clearance bilaterally and slow with decreased balance min A initially veering in hallway, initiated SPC and pt utilizing safely with cues and noted less assist to CGA to close S.  Stairs Stairs: Yes Stairs assistance: Supervision Stair Management: Two rails, Alternating pattern, Forwards Number of Stairs: 2 General stair comments: cues for technique  Wheelchair Mobility     Tilt Bed    Modified Rankin (Stroke Patients Only)       Balance Overall balance assessment: Needs assistance   Sitting balance-Leahy Scale: Good       Standing balance-Leahy Scale: Fair Standing balance comment: static balance 30 sec noUE support; eyes closed 10 sec no UE support, no LOB                             Pertinent Vitals/Pain Pain Assessment Pain Assessment: Faces Faces Pain Scale: Hurts a little bit Pain Location: abdomen Pain Descriptors / Indicators: Aching Pain Intervention(s): Monitored during session    Home Living Family/patient expects to be discharged to:: Private residence Living Arrangements: Children;Parent (mother) Available Help at Discharge: Family;Available 24 hours/day (son goes to work at 5 pm, mother returns home from work 2 pm) Type of Home: House Home Access: Stairs to enter Entrance Stairs-Rails: Right Entrance Stairs-Number of Steps: 2   Home Layout: One level Home Equipment: None  Prior Function Prior Level of Function : Needs assist             Mobility Comments: reports fell with seizures, but has had a few months history of decreased mobility with sleeping a lot on meds and family helping him some       Extremity/Trunk Assessment   Upper Extremity Assessment Upper Extremity Assessment: Generalized weakness    Lower Extremity  Assessment Lower Extremity Assessment: Generalized weakness       Communication   Communication Communication: No apparent difficulties    Cognition Arousal: Alert Behavior During Therapy: WFL for tasks assessed/performed   PT - Cognitive impairments: No apparent impairments                         Following commands: Intact       Cueing       General Comments General comments (skin integrity, edema, etc.): Educated in diet restrictions and need for consuming full liquids for MD to be able to see he tolerates it and can advance his diet.  RN reports pt had planned to have family bring in food for him.  Discussed need for coordinated care with PT/dietician and MD for managing meds, increasing efficiency of what he eats when he can eat (states with meds sleeps most of the day) and for PT to progress strength, stability.    Exercises     Assessment/Plan    PT Assessment Patient needs continued PT services  PT Problem List Decreased strength;Decreased mobility;Decreased activity tolerance;Decreased balance;Decreased knowledge of use of DME       PT Treatment Interventions DME instruction;Gait training;Therapeutic exercise;Balance training;Stair training;Functional mobility training;Patient/family education;Therapeutic activities    PT Goals (Current goals can be found in the Care Plan section)  Acute Rehab PT Goals Patient Stated Goal: to get better PT Goal Formulation: With patient Time For Goal Achievement: 02/05/24 Potential to Achieve Goals: Good    Frequency Min 3X/week     Co-evaluation               AM-PAC PT "6 Clicks" Mobility  Outcome Measure Help needed turning from your back to your side while in a flat bed without using bedrails?: None Help needed moving from lying on your back to sitting on the side of a flat bed without using bedrails?: None Help needed moving to and from a bed to a chair (including a wheelchair)?: A Little Help needed  standing up from a chair using your arms (e.g., wheelchair or bedside chair)?: A Little Help needed to walk in hospital room?: A Little Help needed climbing 3-5 steps with a railing? : A Little 6 Click Score: 20    End of Session Equipment Utilized During Treatment: Gait belt Activity Tolerance: Patient limited by fatigue Patient left: in chair;with call bell/phone within reach;with chair alarm set   PT Visit Diagnosis: Other abnormalities of gait and mobility (R26.89);History of falling (Z91.81);Muscle weakness (generalized) (M62.81)    Time: 7846-9629 PT Time Calculation (min) (ACUTE ONLY): 27 min   Charges:   PT Evaluation $PT Eval Low Complexity: 1 Low PT Treatments $Gait Training: 8-22 mins PT General Charges $$ ACUTE PT VISIT: 1 Visit         Sheran Lawless, PT Acute Rehabilitation Services Office:[phone] 01/22/2024   Elray Mcgregor 01/22/2024, 11:26 AM

## 2024-01-22 NOTE — TOC Progression Note (Signed)
 Transition of Care [facility]) - Progression Note    Patient Details  Name: Maxwell Faulkner MRN: [id number] Date of Birth: 02-13-1986  [facility]) CM/SW Contact  Kermit Balo, RN Phone Number: 01/22/2024, 2:16 PM  Clinical Narrative:     Referral was sent to [facility] for outpatient PT. Pt will call to schedule the first appointment. TOC following for further d/c needs.   Expected Discharge Plan: Home/Self Care Barriers to Discharge: Continued Medical Work up  Expected Discharge Plan and Services   Discharge Planning Services: CM Consult   Living arrangements for the past 2 months: Single Family Home                                       Social Determinants of Health (SDOH) Interventions SDOH Screenings   Food Insecurity: No Food Insecurity (01/18/2024)  Housing: Low Risk  (01/18/2024)  Transportation Needs: No Transportation Needs (01/18/2024)  Utilities: Not At Risk (01/18/2024)  Depression (PHQ2-9): Medium Risk (01/11/2024)  Social Connections: Patient Declined (01/18/2024)  Tobacco Use: High Risk (01/17/2024)    Readmission Risk Interventions     No data to display

## 2024-01-22 NOTE — Discharge Instructions (Signed)
 To control seizures, your medications have been adjusted as follows:  - Depakote (valproate) stopped and listed as an allergy as we think it gave you pancreatitis - Vimpat 200 mg twice daily started  Please keep track of any other spells you have, including shaking spells, loss of consciousness events, staring spells etc to review with your neurologist   Spell log:  - Date and time of event: - Description of event:  - Prodome (any warning / premonition event is going to happen): - Post-spell symptoms: - Any potential triggers:   Standard seizure precautions: Per [employer] statutes, patients with seizures are not allowed to drive until  they have been seizure-free for six months. Use caution when using heavy equipment or power tools. Avoid working on ladders or at heights. Take showers instead of baths. Ensure the water temperature is not too high on the home water heater. Do not go swimming alone. When caring for infants or small children, sit down when holding, feeding, or changing them to minimize risk of injury to the child in the event you have a seizure.  To reduce risk of seizures, maintain good sleep hygiene avoid alcohol and illicit drug use, take all anti-seizure medications as prescribed.

## 2024-01-22 NOTE — Plan of Care (Signed)
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
## 2024-01-23 ENCOUNTER — Other Ambulatory Visit (HOSPITAL_COMMUNITY): Payer: Self-pay

## 2024-01-23 DIAGNOSIS — K859 Acute pancreatitis without necrosis or infection, unspecified: Secondary | ICD-10-CM | POA: Diagnosis not present

## 2024-01-23 MED ORDER — OXYCODONE-ACETAMINOPHEN 5-325 MG PO TABS
1.0000 | ORAL_TABLET | Freq: Four times a day (QID) | ORAL | 0 refills | Status: DC | PRN
  Filled 2024-01-23: qty 15, 4d supply, fill #0

## 2024-01-23 MED ORDER — LACOSAMIDE 200 MG PO TABS
200.0000 mg | ORAL_TABLET | Freq: Two times a day (BID) | ORAL | 0 refills | Status: DC
  Filled 2024-01-23: qty 60, 30d supply, fill #0

## 2024-01-23 NOTE — Plan of Care (Signed)

## 2024-01-23 NOTE — TOC Transition Note (Signed)
 Transition of Care [facility]) - Discharge Note   Patient Details  Name: Maxwell Faulkner MRN: [id number] Date of Birth: 04-Jul-1986  Transition of Care [facility]) CM/SW Contact:  Kermit Balo, RN Phone Number: 01/23/2024, 10:04 AM   Clinical Narrative:     Pt is discharging home with outpatient therapy through [facility]. Pt will call to schedule the first appointment. Pt has transportation home.  Final next level of care: OP Rehab Barriers to Discharge: No Barriers Identified   Patient Goals and CMS Choice            Discharge Placement                       Discharge Plan and Services Additional resources added to the After Visit Summary for     Discharge Planning Services: CM Consult                                 Social Drivers of Health (SDOH) Interventions SDOH Screenings   Food Insecurity: No Food Insecurity (01/18/2024)  Housing: Low Risk  (01/18/2024)  Transportation Needs: No Transportation Needs (01/18/2024)  Utilities: Not At Risk (01/18/2024)  Depression (PHQ2-9): Medium Risk (01/11/2024)  Social Connections: Patient Declined (01/18/2024)  Tobacco Use: High Risk (01/17/2024)     Readmission Risk Interventions     No data to display

## 2024-01-23 NOTE — Discharge Summary (Signed)
 Physician Discharge Summary  Maxwell Faulkner ZOX:[id number] DOB: 1986-04-24 DOA: 01/17/2024  PCP: Avanell Shackleton, NP-C  Admit date: 01/17/2024 Discharge date: 01/23/2024 30 Day Unplanned Readmission Risk Score    Flowsheet Row ED to Hosp-Admission (Current) from 01/17/2024 in [facility]  30 Day Unplanned Readmission Risk Score (%) 24.78 Filed at 01/23/2024 0801       This score is the patient's risk of an unplanned readmission within 30 days of being discharged (0 -100%). The score is based on dignosis, age, lab data, medications, orders, and past utilization.   Low:  0-14.9   Medium: 15-21.9   High: 22-29.9   Extreme: 30 and above          Admitted From: Home Disposition: Home  Recommendations for Outpatient Follow-up:  Follow up with PCP in 1-2 weeks Please obtain BMP/CBC in one week Please follow up with your PCP on the following pending results: Unresulted Labs (From admission, onward)     Start     Ordered   01/24/24 0500  Creatinine, serum  (enoxaparin (LOVENOX)    CrCl >/= 30 ml/min)  Weekly,   R     Comments: while on enoxaparin therapy    01/17/24 2344   01/23/24 0802  CBC with Differential/Platelet  ONCE - URGENT,   URGENT       Question:  Specimen collection method  Answer:  Lab=Lab collect   01/23/24 0801   01/23/24 0802  Basic metabolic panel  Once,   R       Question:  Specimen collection method  Answer:  Lab=Lab collect   01/23/24 0801   01/23/24 0802  Magnesium  Once,   R       Question:  Specimen collection method  Answer:  Lab=Lab collect   01/23/24 0801            If patient has another seizure, call 911 and bring them back to the ED if: A.  The seizure lasts longer than 5 minutes.      B.  The patient doesn't wake shortly after the seizure or has new problems such as difficulty seeing, speaking or moving following the seizure C.  The patient was injured during the seizure D.  The patient has a temperature over 102 F (39C) E.   The patient vomited during the seizure and now is having trouble breathing    During the Seizure   - First, ensure adequate ventilation and place patients on the floor on their left side  Loosen clothing around the neck and ensure the airway is patent. If the patient is clenching the teeth, do not force the mouth open with any object as this can cause severe damage - Remove all items from the surrounding that can be hazardous. The patient may be oblivious to what's happening and may not even know what he or she is doing. If the patient is confused and wandering, either gently guide him/her away and block access to outside areas - Reassure the individual and be comforting - Call 911. In most cases, the seizure ends before EMS arrives. However, there are cases when seizures may last over 3 to 5 minutes. Or the individual may have developed breathing difficulties or severe injuries. If a pregnant patient or a person with diabetes develops a seizure, it is prudent to call an ambulance. - Finally, if the patient does not regain full consciousness, then call EMS. Most patients will remain confused for about 45 to  90 minutes after a seizure, so you must use judgment in calling for help. - Avoid restraints but make sure the patient is in a bed with padded side rails - Place the individual in a lateral position with the neck slightly flexed; this will help the saliva drain from the mouth and prevent the tongue from falling backward - Remove all nearby furniture and other hazards from the area - Provide verbal assurance as the individual is regaining consciousness - Provide the patient with privacy if possible - Call for help and start treatment as ordered by the caregiver    After the Seizure (Postictal Stage)   After a seizure, most patients experience confusion, fatigue, muscle pain and/or a headache. Thus, one should permit the individual to sleep. For the next few days, reassurance is essential. Being  calm and helping reorient the person is also of importance.   Most seizures are painless and end spontaneously. Seizures are not harmful to others but can lead to complications such as stress on the lungs, brain and the heart. Individuals with prior lung problems may develop labored breathing and respiratory distress.  -Per [facility] statutes, patients with seizures are not allowed to drive until they have been seizure-free for six months. Use caution when using heavy equipment or power tools. Avoid working on ladders or at heights. Take showers instead of baths. Ensure the water temperature is not too high on the home water heater. Do not go swimming alone. Do not lock yourself in a room alone (i.e. bathroom). When caring for infants or small children, sit down when holding, feeding, or changing them to minimize risk of injury to the child in the event you have a seizure. Maintain good sleep hygiene. Avoid alcohol.  Home Health: None Equipment/Devices: None  Discharge Condition: Stable CODE STATUS: Full code Diet recommendation: Cardiac  Subjective: Seen and examined.  Abdominal pain is much better only 4-5 out of 10.  No nausea, tolerating full liquid diet.  Appears extremely comfortable.  Wife at the bedside.  Discussed plan of discharge with him, he feels comfortable going home as long as he is given pain medications to go home with.  Brief/Interim Summary: Patient is a 38 year old African-American male with a past medical history significant for Manson Passey to anxiety, depression, seizures as well as other comorbidities presented the ED for abdominal pain and was admitted for acute pancreatitis.  He was vomiting and was not able to take his seizure medications and subsequently had 2 seizures while hospitalized and transferred to [facility] for LTM EEG and Neuro consultation.  Neurology loaded patient with Nell Range and discontinued his VPA.   Unfortunately the patient also had another  breakthrough seizure overnight (3/25) so Neurology was reconsulted and now they have discontinued his Keppra and have initiated the Lacosamide 200 mg IV BID.  Details of the hospitalization as below.   Acute Pancreatitis:  -CT Abd/Pelvs showed pancreatitis with generalized inflamed pancreas and confluent edema ascites throughout the abdomen with free fluid layering in the pelvis and paracolic gutters with no pancreatic necrosis or organized fluid collection or other complicating factors noted.  Treated conservatively, pain improved, for out of 10.  Tolerating full liquid diet.  Diet advanced to soft diet which she is tolerating as well and thus he will be discharged with some pain medications today.  Since valproic acid may cause pancreatitis so this medication was discontinued and neurology added this to his allergy list.    Breakthrough seizure in show of  seizure disorder (HCC): -Repeated seizure activity noted 01/19/24. Neurology consulted and Patient was transferred to [facility] for LTM EEG. EEG showed evidence of epileptogenicity arising from the left temporoparietal region.  Initiated on Keppra load with 2 g bolus and and placed on Keppra 2000 mg twice daily and now reduced 1500 mg twice daily.   -Unfortunately he had seizure activity overnight 3/25 and because of this Neurology reconsulted 01/22/2004. -VPA recommended to be held indefinitely and discontinued given the pancreatitis. -Neurology feels  EEG would not change management at this time.  Due to poor efficacy, Keppra was discontinued and he was started on Vimpat.  Patient has been seizure-free now.  Neurology cleared him for discharge.  He will need to follow-up with neurology as outpatient in 2 to 4 weeks.   Anxiety and Depression: C/w Sertraline 50 mg po qHS and Quetiapine 50 mg qHS   Polysubstance Abuse: UDS is + for THC, opiates and benzodiazepines. Counseling given.   Hypokalemia: Improved.    Hypomagnesemia: Mag Level is now 1.5 yesterday  which was replenished with magnesium sulfate 4 g.    Leukocytosis: Mild and resolved.    Normocytic Anemia: Hgb/Hct now 10.4/30.5. Anemia Panel showed an iron level of 46, UIBC of 252, TIBC of 298, saturation ratios of 15%, ferritin level 125, 406.9 vitamin B12 08 32. CTM for S/Sx of Bleeding; No overt bleeding noted. Repeat CBC in the AM    Thrombocytopenia: Resolved.   Hypoalbuminemia: Patient's Albumin is now 2.3.  Repeat CMP at PCP office.  Discharge plan was discussed with patient and/or family member and they verbalized understanding and agreed with it.  Discharge Diagnoses:  Principal Problem:   Pancreatitis Active Problems:   Seizures (HCC)   Acute pancreatitis    Discharge Instructions  Discharge Instructions     Ambulatory referral to Physical Therapy   Complete by: As directed       Allergies as of 01/23/2024       Reactions   Depakote [divalproex Sodium] Other (See Comments)   Pancreatitis 12/2023        Medication List     TAKE these medications    lacosamide 200 MG Tabs tablet Commonly known as: Vimpat Take 1 tablet (200 mg total) by mouth 2 (two) times daily.   ondansetron 4 MG disintegrating tablet Commonly known as: ZOFRAN-ODT Take 1 tablet (4 mg total) by mouth every 8 (eight) hours as needed for nausea or vomiting.   oxyCODONE-acetaminophen 5-325 MG tablet Commonly known as: PERCOCET/ROXICET Take 1 tablet by mouth every 6 (six) hours as needed for severe pain (pain score 7-10).   QUEtiapine 50 MG tablet Commonly known as: SEROquel Take 1 tablet (50 mg total) by mouth at bedtime.   sertraline 50 MG tablet Commonly known as: Zoloft Take 1 tablet (50 mg total) by mouth daily.        Follow-up Information     [facility]. Schedule an appointment as soon as possible for a visit.   Specialty: Rehabilitation Contact information: [address] [place] [address] [phone]         [name], [name], NP-C Follow up in 1 week(s).   Specialty: Family Medicine Contact information: [address] [place] [address] [phone]                Allergies  Allergen Reactions   Depakote [Divalproex Sodium] Other (See Comments)    Pancreatitis 12/2023    Consultations: Neurology   Procedures/Studies:  Overnight EEG with video Result Date: 01/20/2024 Charlsie Quest, MD     01/21/2024  9:27 AM Patient Name: NAHIEM DREDGE MRN: [id number] Epilepsy Attending: Charlsie Quest Referring Physician/Provider: Milon Dikes, MD Duration: 01/19/2024 2153 to 01/20/2024 1014 Patient history: 38 y.o. male with hx of anxiety, depression, hypertension, seizures-on Depakote, patient of Dr. Teresa Coombs at [facility], admitted for acute pancreatitis with nausea and vomiting, had back-to-back episodes of seizures while at [facility] where he was receiving treatment for pancreatitis and was recommended to be transferred to [facility]. EEG to evaluate for seizure Level of alertness: Awake, asleep AEDs during EEG study: LEV Technical aspects: This EEG study was done with scalp electrodes positioned according to the 10-20 International system of electrode placement. Electrical activity was reviewed with band pass filter of 1-70Hz , sensitivity of 7 uV/mm, display speed of 67mm/sec with a 60Hz  notched filter applied as appropriate. EEG data were recorded continuously and digitally stored.  Video monitoring was available and reviewed as appropriate. Description: The posterior dominant rhythm consists of 8 Hz activity of moderate voltage (25-35 uV) seen predominantly in posterior head regions, symmetric and reactive to eye opening and eye closing. Sleep was characterized by vertex waves, sleep spindles (12 to 14 Hz), maximal frontocentral region. Frequent spikes were noted in left temporo-parietal region. Hyperventilation and photic stimulation were not performed.    ABNORMALITY -Spike, left temporo-parietal region IMPRESSION: This study is showed evidence of epileptogenicity arising from left temporo-parietal region. No seizures were seen throughout the recording. Charlsie Quest   MR BRAIN W WO CONTRAST Result Date: 01/18/2024 CLINICAL DATA:  38 year old male with abdominal pain, pancreatitis, vomiting, seizures. EXAM: MRI HEAD WITHOUT AND WITH CONTRAST TECHNIQUE: Multiplanar, multiecho pulse sequences of the brain and surrounding structures were obtained without and with intravenous contrast. CONTRAST:  5mL GADAVIST GADOBUTROL 1 MMOL/ML IV SOLN COMPARISON:  Head CT 10/23/2023. FINDINGS: Brain: Normal cerebral volume. No restricted diffusion to suggest acute infarction. No midline shift, mass effect, evidence of mass lesion, ventriculomegaly, extra-axial collection or acute intracranial hemorrhage. Cervicomedullary junction and pituitary are within normal limits. Wallace Cullens and white matter signal is within normal limits throughout the brain. No encephalomalacia or chronic cerebral blood products. On thin slice coronal images the hippocampal formations and mesial temporal lobes appear symmetric and normal. No abnormal enhancement identified. No dural thickening. Vascular: Major intracranial vascular flow voids are preserved. Following contrast the major dural venous sinuses are enhancing and appear to be patent. Skull and upper cervical spine: Visualized bone marrow signal is within normal limits. Normal visible cervical spine. Sinuses/Orbits: Orbits are negative. Paranasal sinuses remain well aerated. Other: Trace bilateral mastoid fluid. Other visible internal auditory structures appear normal. Negative visible scalp and face. IMPRESSION: Normal MRI appearance of the Brain. Electronically Signed   By: Odessa Fleming M.D.   On: 01/18/2024 04:01   CT ABDOMEN PELVIS W CONTRAST Result Date: 01/16/2024 CLINICAL DATA:  38 year old male with abdominal pain.  Pancreatitis. EXAM: CT  ABDOMEN AND PELVIS WITH CONTRAST TECHNIQUE: Multidetector CT imaging of the abdomen and pelvis was performed using the standard protocol following bolus administration of intravenous contrast. RADIATION DOSE REDUCTION: This exam was performed according to the departmental dose-optimization program which includes automated exposure control, adjustment of the mA and/or kV according to patient size and/or use of iterative reconstruction technique. CONTRAST:  OMNIPAQUE IOHEXOL 300 MG/ML  SOLN COMPARISON:  Pelvis CT 12/11/2023. CT Abdomen and Pelvis 10/14/2023. FINDINGS: Lower chest: Lower lung volumes with  dependent lung base atelectasis. No pericardial or pleural effusion. Hepatobiliary: New free fluid in the abdomen since December, simple fluid density. Mostly this is medial and inferior to the liver. Liver enhancement, gallbladder are within normal limits. Pancreas: Enlarged, inflamed, heterogeneously enhancing throughout. Abundant edema in the lesser sac and abundant regional surrounding upper abdominal confluent edema or low-density ascites. No discrete pancreatic necrosis. No discrete mass or ductal dilatation. No organized fluid collection. Spleen: Adjacent free fluid but otherwise negative. Adrenals/Urinary Tract: Anterior pararenal space ascites or edema, but otherwise negative adrenal glands. Small benign appearing renal cysts, the largest in the left lower pole has simple fluid density. (No follow up imaging recommended). Punctate left midpole nephrolithiasis. No hydronephrosis. No evidence of hydroureter. No delayed excretory images. Stomach/Bowel: Nondilated large and small bowel throughout the abdomen and pelvis with superimposed ascites, most abundant in the upper abdomen but also layering in the left pericolic gutter (series 2, image 46). Retained stool in the transverse colon. No pneumoperitoneum identified. Cecum partially located in the pelvis. Appendix not well delineated. Retained fluid in the  proximal stomach. Distal stomach and duodenum are mostly decompressed. Vascular/Lymphatic: Major arterial structures and the portal venous system including the splenic vein remain patent. Mild soft atherosclerotic plaque of the distal abdominal aorta suspected and stable. Superimposed iliac artery combined soft and calcified plaque. No lymphadenopathy identified. Reproductive: Negative. Other: Small volume of free fluid layering in the pelvis with simple fluid density on series 2, image 67. Musculoskeletal: Negative. IMPRESSION: 1. Acute Pancreatitis with generalized inflamed pancreas. Confluent edema and Ascites throughout the abdomen, with free fluid layering in the pelvis and paracolic gutters. No pancreatic necrosis, organized fluid collection, or other complicating features at this time. 2. No other acute or inflammatory process identified in the abdomen or pelvis. 3. Mild but advanced for age Aortoiliac calcified atherosclerosis. Punctate nephrolithiasis. Electronically Signed   By: Odessa Fleming M.D.   On: 01/16/2024 05:18     Discharge Exam: Vitals:   01/23/24 0345 01/23/24 0804  BP: 130/74 (!) 166/97  Pulse: 61 (!) 53  Resp: 18 18  Temp: 98.5 F (36.9 C) 98.1 F (36.7 C)  SpO2: 97% 97%   Vitals:   01/22/24 1946 01/22/24 2343 01/23/24 0345 01/23/24 0804  BP: (!) 163/93 112/69 130/74 (!) 166/97  Pulse: 61 64 61 (!) 53  Resp: 18 18 18 18   Temp: 98.4 F (36.9 C) 98.4 F (36.9 C) 98.5 F (36.9 C) 98.1 F (36.7 C)  TempSrc: Oral Oral Oral Oral  SpO2: 99% 97% 97% 97%  Weight:      Height:        General: Pt is alert, awake, not in acute distress Cardiovascular: RRR, S1/S2 +, no rubs, no gallops Respiratory: CTA bilaterally, no wheezing, no rhonchi Abdominal: Soft, very minimal epigastric tenderness, ND, bowel sounds + Extremities: no edema, no cyanosis    The results of significant diagnostics from this hospitalization (including imaging, microbiology, ancillary and laboratory) are  listed below for reference.     Microbiology: No results found for this or any previous visit (from the past 240 hours).   Labs: BNP (last 3 results) No results for input(s): "BNP" in the last 8760 hours. Basic Metabolic Panel: Recent Labs  Lab 01/19/24 0902 01/20/24 0656 01/20/24 1023 01/21/24 0750 01/22/24 0541  NA 141 139 142 141 139  K 4.0 3.7 3.6 3.8 4.0  CL 106 105 105 105 108  CO2 28 31 30 30 24   GLUCOSE 89 92 115* 138*  64*  BUN 12 8 9 11 6   CREATININE 0.79 0.77 0.81 0.86 0.69  CALCIUM 8.7* 8.4* 8.6* 8.8* 8.2*  MG  --   --  1.6* 2.3 1.5*  PHOS  --   --  3.5 3.6 3.3   Liver Function Tests: Recent Labs  Lab 01/17/24 1559 01/20/24 0656 01/20/24 1023 01/21/24 0750 01/22/24 0541  AST 22 32 30 27 22   ALT 11 15 14 14 14   ALKPHOS 55 42 43 45 36*  BILITOT 0.8 0.4 0.5 0.4 0.4  PROT 7.0 5.2* 5.1* 5.8* 4.6*  ALBUMIN 3.5 2.6* 2.5* 2.9* 2.3*   Recent Labs  Lab 01/19/24 0902 01/20/24 0656 01/20/24 1023 01/21/24 0750 01/22/24 0541  LIPASE 145* 131* 175* 181* 108*   No results for input(s): "AMMONIA" in the last 168 hours. CBC: Recent Labs  Lab 01/17/24 1559 01/18/24 0545 01/20/24 0656 01/21/24 0750 01/22/24 0541  WBC 12.5* 8.6 6.2 6.0 5.8  NEUTROABS  --   --  2.8 3.4 3.7  HGB 13.7 11.0* 10.3* 11.0* 10.4*  HCT 41.2 31.6* 30.0* 32.6* 30.5*  MCV 97.9 96.3 94.6 95.6 94.7  PLT 137* 133* 172 190 188   Cardiac Enzymes: No results for input(s): "CKTOTAL", "CKMB", "CKMBINDEX", "TROPONINI" in the last 168 hours. BNP: Invalid input(s): "POCBNP" CBG: Recent Labs  Lab 01/21/24 2159  GLUCAP 88   D-Dimer No results for input(s): "DDIMER" in the last 72 hours. Hgb A1c No results for input(s): "HGBA1C" in the last 72 hours. Lipid Profile No results for input(s): "CHOL", "HDL", "LDLCALC", "TRIG", "CHOLHDL", "LDLDIRECT" in the last 72 hours. Thyroid function studies No results for input(s): "TSH", "T4TOTAL", "T3FREE", "THYROIDAB" in the last 72  hours.  Invalid input(s): "FREET3" Anemia work up Recent Labs    01/21/24 0750  VITAMINB12 832  FOLATE 6.9  FERRITIN 125  TIBC 298  IRON 46  RETICCTPCT 0.8   Urinalysis    Component Value Date/Time   COLORURINE YELLOW 01/17/2024 1937   APPEARANCEUR CLEAR 01/17/2024 1937   LABSPEC 1.027 01/17/2024 1937   PHURINE 5.0 01/17/2024 1937   GLUCOSEU NEGATIVE 01/17/2024 1937   HGBUR SMALL (A) 01/17/2024 1937   BILIRUBINUR NEGATIVE 01/17/2024 1937   KETONESUR 20 (A) 01/17/2024 1937   PROTEINUR 30 (A) 01/17/2024 1937   UROBILINOGEN 0.2 09/17/2020 1031   NITRITE NEGATIVE 01/17/2024 1937   LEUKOCYTESUR NEGATIVE 01/17/2024 1937   Sepsis Labs Recent Labs  Lab 01/18/24 0545 01/20/24 0656 01/21/24 0750 01/22/24 0541  WBC 8.6 6.2 6.0 5.8   Microbiology No results found for this or any previous visit (from the past 240 hours).  FURTHER DISCHARGE INSTRUCTIONS:   Get Medicines reviewed and adjusted: Please take all your medications with you for your next visit with your Primary MD   Laboratory/radiological data: Please request your Primary MD to go over all hospital tests and procedure/radiological results at the follow up, please ask your Primary MD to get all Hospital records sent to his/her office.   In some cases, they will be blood work, cultures and biopsy results pending at the time of your discharge. Please request that your primary care M.D. goes through all the records of your hospital data and follows up on these results.   Also Note the following: If you experience worsening of your admission symptoms, develop shortness of breath, life threatening emergency, suicidal or homicidal thoughts you must seek medical attention immediately by calling 911 or calling your MD immediately  if symptoms less severe.   You must read  complete instructions/literature along with all the possible adverse reactions/side effects for all the Medicines you take and that have been prescribed to  you. Take any new Medicines after you have completely understood and accpet all the possible adverse reactions/side effects.    Do not drive when taking Pain medications or sleeping medications (Benzodaizepines)   Do not take more than prescribed Pain, Sleep and Anxiety Medications. It is not advisable to combine anxiety,sleep and pain medications without talking with your primary care practitioner   Special Instructions: If you have smoked or chewed Tobacco  in the last 2 yrs please stop smoking, stop any regular Alcohol  and or any Recreational drug use.   Wear Seat belts while driving.   Please note: You were cared for by a hospitalist during your hospital stay. Once you are discharged, your primary care physician will handle any further medical issues. Please note that NO REFILLS for any discharge medications will be authorized once you are discharged, as it is imperative that you return to your primary care physician (or establish a relationship with a primary care physician if you do not have one) for your post hospital discharge needs so that they can reassess your need for medications and monitor your lab values  Time coordinating discharge: Over 30 minutes  SIGNED:   Hughie Closs, MD  [facility] 01/23/2024, 9:11 AM *Please note that this is a verbal dictation therefore any spelling or grammatical errors are due to the "[employer]agon [employer] One" system interpretation. If 7PM-7AM, please contact night-coverage [email or web address]

## 2024-01-23 NOTE — Plan of Care (Signed)
  Problem: Education: Goal: Knowledge of General Education information will improve Description: Including pain rating scale, medication(s)/side effects and non-pharmacologic comfort measures Outcome: Adequate for Discharge   Problem: Health Behavior/Discharge Planning: Goal: Ability to manage health-related needs will improve Outcome: Adequate for Discharge   Problem: Clinical Measurements: Goal: Ability to maintain clinical measurements within normal limits will improve Outcome: Adequate for Discharge Goal: Will remain free from infection Outcome: Adequate for Discharge Goal: Diagnostic test results will improve Outcome: Adequate for Discharge Goal: Respiratory complications will improve Outcome: Adequate for Discharge Goal: Cardiovascular complication will be avoided Outcome: Adequate for Discharge   Problem: Activity: Goal: Risk for activity intolerance will decrease Outcome: Adequate for Discharge   Problem: Nutrition: Goal: Adequate nutrition will be maintained Outcome: Adequate for Discharge   Problem: Coping: Goal: Level of anxiety will decrease Outcome: Adequate for Discharge   Problem: Elimination: Goal: Will not experience complications related to bowel motility Outcome: Adequate for Discharge Goal: Will not experience complications related to urinary retention Outcome: Adequate for Discharge   Problem: Pain Managment: Goal: General experience of comfort will improve and/or be controlled Outcome: Adequate for Discharge   Problem: Safety: Goal: Ability to remain free from injury will improve Outcome: Adequate for Discharge   Problem: Skin Integrity: Goal: Risk for impaired skin integrity will decrease Outcome: Adequate for Discharge   Problem: Acute Rehab PT Goals(only PT should resolve) Goal: Patient Will Transfer Sit To/From Stand Outcome: Adequate for Discharge Goal: Pt Will Perform Standing Balance Or Pre-Gait Outcome: Adequate for Discharge Goal:  Pt Will Ambulate Outcome: Adequate for Discharge Goal: Pt Will Go Up/Down Stairs Outcome: Adequate for Discharge   Problem: Acute Rehab PT Goals(only PT should resolve) Goal: Pt/caregiver will Perform Home Exercise Program Outcome: Adequate for Discharge

## 2024-01-23 NOTE — Progress Notes (Signed)
 Explained discharge instructions to patient. Reviewed follow up appointment and next medication administration times. Also reviewed education. Patient verbalized having an understanding for instructions given. All belongings are in the patient's possession to include his cane. Will pick up his TOC meds on his way down to main entrance. IV was removed. No other needs verbalized. Transportation was called to take downstairs for discharge. Notified to stop by [facility] prior to leaving.

## 2024-01-25 ENCOUNTER — Encounter (HOSPITAL_COMMUNITY): Payer: Self-pay | Admitting: Emergency Medicine

## 2024-01-25 ENCOUNTER — Observation Stay (HOSPITAL_COMMUNITY)

## 2024-01-25 ENCOUNTER — Emergency Department (HOSPITAL_COMMUNITY)

## 2024-01-25 ENCOUNTER — Other Ambulatory Visit: Payer: Self-pay

## 2024-01-25 ENCOUNTER — Observation Stay (HOSPITAL_COMMUNITY)
Admission: EM | Admit: 2024-01-25 | Discharge: 2024-01-26 | Disposition: A | Discharge status: Home / Self Care | Attending: Internal Medicine | Admitting: Internal Medicine

## 2024-01-25 ENCOUNTER — Ambulatory Visit: Admitting: Family Medicine

## 2024-01-25 DIAGNOSIS — K859 Acute pancreatitis without necrosis or infection, unspecified: Secondary | ICD-10-CM | POA: Diagnosis not present

## 2024-01-25 DIAGNOSIS — K219 Gastro-esophageal reflux disease without esophagitis: Secondary | ICD-10-CM | POA: Insufficient documentation

## 2024-01-25 DIAGNOSIS — N200 Calculus of kidney: Secondary | ICD-10-CM | POA: Diagnosis not present

## 2024-01-25 DIAGNOSIS — I10 Essential (primary) hypertension: Secondary | ICD-10-CM | POA: Insufficient documentation

## 2024-01-25 DIAGNOSIS — R109 Unspecified abdominal pain: Secondary | ICD-10-CM | POA: Diagnosis not present

## 2024-01-25 DIAGNOSIS — Z79899 Other long term (current) drug therapy: Secondary | ICD-10-CM | POA: Diagnosis not present

## 2024-01-25 DIAGNOSIS — R7989 Other specified abnormal findings of blood chemistry: Secondary | ICD-10-CM | POA: Diagnosis not present

## 2024-01-25 DIAGNOSIS — R0789 Other chest pain: Secondary | ICD-10-CM

## 2024-01-25 DIAGNOSIS — F17210 Nicotine dependence, cigarettes, uncomplicated: Secondary | ICD-10-CM | POA: Insufficient documentation

## 2024-01-25 DIAGNOSIS — F418 Other specified anxiety disorders: Secondary | ICD-10-CM | POA: Insufficient documentation

## 2024-01-25 DIAGNOSIS — K8500 Idiopathic acute pancreatitis without necrosis or infection: Secondary | ICD-10-CM

## 2024-01-25 DIAGNOSIS — R079 Chest pain, unspecified: Secondary | ICD-10-CM

## 2024-01-25 DIAGNOSIS — R569 Unspecified convulsions: Secondary | ICD-10-CM | POA: Diagnosis not present

## 2024-01-25 DIAGNOSIS — R778 Other specified abnormalities of plasma proteins: Secondary | ICD-10-CM | POA: Diagnosis not present

## 2024-01-25 DIAGNOSIS — N281 Cyst of kidney, acquired: Secondary | ICD-10-CM | POA: Diagnosis not present

## 2024-01-25 DIAGNOSIS — I700 Atherosclerosis of aorta: Secondary | ICD-10-CM | POA: Diagnosis not present

## 2024-01-25 DIAGNOSIS — R072 Precordial pain: Secondary | ICD-10-CM | POA: Diagnosis present

## 2024-01-25 LAB — URINALYSIS, ROUTINE W REFLEX MICROSCOPIC
Bilirubin Urine: NEGATIVE
Glucose, UA: NEGATIVE mg/dL
Hgb urine dipstick: NEGATIVE
Ketones, ur: NEGATIVE mg/dL
Leukocytes,Ua: NEGATIVE
Nitrite: NEGATIVE
Protein, ur: NEGATIVE mg/dL
Specific Gravity, Urine: 1.023 (ref 1.005–1.030)
pH: 5 (ref 5.0–8.0)

## 2024-01-25 LAB — COMPREHENSIVE METABOLIC PANEL WITH GFR
ALT: 24 U/L (ref 0–44)
AST: 25 U/L (ref 15–41)
Albumin: 3.9 g/dL (ref 3.5–5.0)
Alkaline Phosphatase: 53 U/L (ref 38–126)
Anion gap: 9 (ref 5–15)
BUN: 10 mg/dL (ref 6–20)
CO2: 26 mmol/L (ref 22–32)
Calcium: 9.5 mg/dL (ref 8.9–10.3)
Chloride: 106 mmol/L (ref 98–111)
Creatinine, Ser: 0.85 mg/dL (ref 0.61–1.24)
GFR, Estimated: >60 mL/min (ref >60)
Glucose, Bld: 105 mg/dL — ABNORMAL HIGH (ref 70–99)
Potassium: 3.6 mmol/L (ref 3.5–5.1)
Sodium: 141 mmol/L (ref 135–145)
Total Bilirubin: 0.6 mg/dL (ref 0.0–1.2)
Total Protein: 7.4 g/dL (ref 6.5–8.1)

## 2024-01-25 LAB — TROPONIN I (HIGH SENSITIVITY)
Troponin I (High Sensitivity): 25 ng/L — ABNORMAL HIGH (ref <18)
Troponin I (High Sensitivity): 32 ng/L — ABNORMAL HIGH (ref <18)
Troponin I (High Sensitivity): 40 ng/L — ABNORMAL HIGH (ref <18)
Troponin I (High Sensitivity): 59 ng/L — ABNORMAL HIGH (ref <18)

## 2024-01-25 LAB — ECHOCARDIOGRAM COMPLETE
Area-P 1/2: 3.37 cm2
Height: 66 in
S' Lateral: 2.7 cm
Weight: 1625.6 [oz_av]

## 2024-01-25 LAB — CBC
HCT: 38.9 % — ABNORMAL LOW (ref 39.0–52.0)
Hemoglobin: 13.3 g/dL (ref 13.0–17.0)
MCH: 32.3 pg (ref 26.0–34.0)
MCHC: 34.2 g/dL (ref 30.0–36.0)
MCV: 94.4 fL (ref 80.0–100.0)
Platelets: 337 10*3/uL (ref 150–400)
RBC: 4.12 MIL/uL — ABNORMAL LOW (ref 4.22–5.81)
RDW: 13.5 % (ref 11.5–15.5)
WBC: 9.6 10*3/uL (ref 4.0–10.5)
nRBC: 0 % (ref 0.0–0.2)

## 2024-01-25 LAB — LIPASE, BLOOD: Lipase: 101 U/L — ABNORMAL HIGH (ref 11–51)

## 2024-01-25 LAB — CBG MONITORING, ED: Glucose-Capillary: 105 mg/dL — ABNORMAL HIGH (ref 70–99)

## 2024-01-25 MED ORDER — HYDROCODONE-ACETAMINOPHEN 7.5-325 MG/15ML PO SOLN
10.0000 mL | Freq: Once | ORAL | Status: AC
  Administered 2024-01-25: 10 mL via ORAL
  Filled 2024-01-25: qty 15

## 2024-01-25 MED ORDER — LACOSAMIDE 50 MG PO TABS
200.0000 mg | ORAL_TABLET | Freq: Two times a day (BID) | ORAL | Status: DC
Start: 2024-01-25 — End: 2024-01-26
  Administered 2024-01-25 – 2024-01-26 (×3): 200 mg via ORAL
  Filled 2024-01-25 (×3): qty 4

## 2024-01-25 MED ORDER — ACETAMINOPHEN 325 MG PO TABS: 650.0000 mg | ORAL_TABLET | Freq: Four times a day (QID) | ORAL | Status: DC | PRN

## 2024-01-25 MED ORDER — ONDANSETRON HCL 4 MG/2ML IJ SOLN
4.0000 mg | Freq: Four times a day (QID) | INTRAMUSCULAR | Status: DC | PRN
Start: 2024-01-25 — End: 2024-01-26

## 2024-01-25 MED ORDER — ONDANSETRON HCL 4 MG PO TABS
4.0000 mg | ORAL_TABLET | Freq: Four times a day (QID) | ORAL | Status: DC | PRN
Start: 2024-01-25 — End: 2024-01-26

## 2024-01-25 MED ORDER — ALUM & MAG HYDROXIDE-SIMETH 200-200-20 MG/5ML PO SUSP
30.0000 mL | Freq: Once | ORAL | Status: AC
  Administered 2024-01-25: 30 mL via ORAL
  Filled 2024-01-25: qty 30

## 2024-01-25 MED ORDER — IOHEXOL 350 MG/ML SOLN
75.0000 mL | Freq: Once | INTRAVENOUS | Status: AC | PRN
  Administered 2024-01-25: 75 mL via INTRAVENOUS

## 2024-01-25 MED ORDER — SODIUM CHLORIDE 0.9 % IV BOLUS
1000.0000 mL | Freq: Once | INTRAVENOUS | Status: AC
  Administered 2024-01-25: 1000 mL via INTRAVENOUS

## 2024-01-25 MED ORDER — HYDROMORPHONE HCL 1 MG/ML IJ SOLN
0.5000 mg | INTRAMUSCULAR | Status: DC | PRN
  Administered 2024-01-25 – 2024-01-26 (×3): 0.5 mg via INTRAVENOUS
  Filled 2024-01-25 (×3): qty 0.5

## 2024-01-25 MED ORDER — SERTRALINE HCL 50 MG PO TABS
50.0000 mg | ORAL_TABLET | Freq: Every day | ORAL | Status: DC
Start: 2024-01-25 — End: 2024-01-26
  Administered 2024-01-25 – 2024-01-26 (×2): 50 mg via ORAL
  Filled 2024-01-25 (×2): qty 1

## 2024-01-25 MED ORDER — PROCHLORPERAZINE EDISYLATE 10 MG/2ML IJ SOLN
10.0000 mg | Freq: Once | INTRAMUSCULAR | Status: AC
  Administered 2024-01-25: 10 mg via INTRAVENOUS
  Filled 2024-01-25: qty 2

## 2024-01-25 MED ORDER — PANTOPRAZOLE SODIUM 40 MG IV SOLR
40.0000 mg | Freq: Once | INTRAVENOUS | Status: AC
  Administered 2024-01-25: 40 mg via INTRAVENOUS
  Filled 2024-01-25: qty 10

## 2024-01-25 MED ORDER — ACETAMINOPHEN 650 MG RE SUPP: 650.0000 mg | Freq: Four times a day (QID) | RECTAL | Status: DC | PRN

## 2024-01-25 MED ORDER — FAMOTIDINE 20 MG PO TABS
20.0000 mg | ORAL_TABLET | Freq: Two times a day (BID) | ORAL | Status: DC
  Administered 2024-01-25 – 2024-01-26 (×3): 20 mg via ORAL
  Filled 2024-01-25 (×3): qty 1

## 2024-01-25 MED ORDER — LIDOCAINE VISCOUS HCL 2 % MT SOLN
15.0000 mL | Freq: Once | OROMUCOSAL | Status: AC
  Administered 2024-01-25: 15 mL via ORAL
  Filled 2024-01-25: qty 15

## 2024-01-25 MED ORDER — BOOST / RESOURCE BREEZE PO LIQD CUSTOM: 1.0000 | Freq: Three times a day (TID) | ORAL | Status: DC

## 2024-01-25 MED ORDER — OXYCODONE HCL 5 MG PO TABS
5.0000 mg | ORAL_TABLET | ORAL | Status: DC | PRN
  Filled 2024-01-25: qty 1

## 2024-01-25 MED ORDER — ENOXAPARIN SODIUM 40 MG/0.4ML IJ SOSY
40.0000 mg | PREFILLED_SYRINGE | INTRAMUSCULAR | Status: DC
  Administered 2024-01-25 – 2024-01-26 (×2): 40 mg via SUBCUTANEOUS
  Filled 2024-01-25 (×2): qty 0.4

## 2024-01-25 MED ORDER — HYDROMORPHONE HCL 1 MG/ML IJ SOLN
0.5000 mg | Freq: Once | INTRAMUSCULAR | Status: AC
  Administered 2024-01-25: 0.5 mg via INTRAVENOUS
  Filled 2024-01-25: qty 1

## 2024-01-25 MED ORDER — QUETIAPINE FUMARATE 50 MG PO TABS
50.0000 mg | ORAL_TABLET | Freq: Every day | ORAL | Status: DC
Start: 2024-01-25 — End: 2024-01-26
  Administered 2024-01-25: 50 mg via ORAL
  Filled 2024-01-25: qty 1

## 2024-01-25 NOTE — Progress Notes (Deleted)
 Cardiology Consultation   Patient ID: Maxwell Faulkner MRN: [id number]; DOB: December 30, 1985  Admit date: 01/17/2024 Date of Consult: 01/25/2024  PCP:  Avanell Shackleton, NP-C   [facility] Providers Cardiologist:  None        Patient Profile:   Maxwell Faulkner is a 38 y.o. male with a hx of seizure disorder, acute pancreatitis, anxiety, history of polysubstance abuse, depression, who is being seen 01/25/2024 for the evaluation of atypical chest pain at the request of Dorcas Carrow MD.  History of Present Illness:   Maxwell Faulkner is a 38 year old male with no prior cardiac history presented to the emergency department on 01/25/2024 with epigastric pain (pancreatitis) for 1 week and retrosternal chest pain for 2 days. Was recently admitted to the hospital with seizures and acute pancreatitis that was suspected to be secondary to Depakote.  This was treated based on symptoms and discharged to home on 01/23/24 with pain medications.   Labs showed troponin of 25>32>40>59.  EKG showed normal sinus rhythm with a rate of 87 and LVH.  X-ray showed chronic emphysematous changes but no acute cardiopulmonary disease. Chemistry panel was unremarkable with a creatinine of 0.85.  Blood count unremarkable with a hemoglobin of 13.3. Repeat CT scan of the abdomen showed improved pancreatitis, and atherosclerosis and aorta and iliacs.  Lipase was elevated at 101. It peaked at 181 on the prior hospitalization  On interview patient generally affirms the patient states that the new onset chest pain is left-sided.  The newer retrosternal pain is a deep aching 10/10 intense pain that is better with pain medications and worse with movement. Is currently having the chest pain. The pain was reproducible on palpation and is pleuritic.  Had palpitations and lightheadedness.  Denies using illicit substances but has had multiple urine drug screens positive for THC benzodiazepines and opiates.   Past Medical History:   Diagnosis Date   Anxiety    Depression    Hypertension    Seizures (HCC)     Past Surgical History:  Procedure Laterality Date   WISDOM TOOTH EXTRACTION         Inpatient Medications: Scheduled Meds:   Continuous Infusions:  PRN Meds:   Allergies:    Allergies  Allergen Reactions   Depakote [Divalproex Sodium] Other (See Comments)    Pancreatitis 12/2023    Social History:   Social History   Socioeconomic History   Marital status: Significant Other    Spouse name: Systems developer   Number of children: 3   Years of education: Not on file   Highest education level: Not on file  Occupational History   Not on file  Tobacco Use   Smoking status: Every Day    Current packs/day: 1.00    Types: Cigarettes   Smokeless tobacco: Never  Vaping Use   Vaping status: Never Used  Substance and Sexual Activity   Alcohol use: Not Currently    Comment: stopped drinking   Drug use: Not Currently    Types: Marijuana    Comment: last use 2 weeks ago   Sexual activity: Yes    Birth control/protection: None  Other Topics Concern   Not on file  Social History Narrative   Right handed   Caffeine 1 cup daily   Social Drivers of Health   Financial Resource Strain: Not on file  Food Insecurity: No Food Insecurity (01/25/2024)   Hunger Vital Sign    Worried About Running Out of Food in the  Last Year: Never true    Ran Out of Food in the Last Year: Never true  Transportation Needs: No Transportation Needs (01/25/2024)   PRAPARE - Administrator, Civil Service (Medical): No    Lack of Transportation (Non-Medical): No  Physical Activity: Not on file  Stress: Not on file  Social Connections: Patient Declined (01/25/2024)   Social Connection and Isolation Panel [NHANES]    Frequency of Communication with Friends and Family: Patient declined    Frequency of Social Gatherings with Friends and Family: Patient declined    Attends Religious Services: Patient declined     Database administrator or Organizations: Patient declined    Attends Banker Meetings: Patient declined    Marital Status: Patient declined  Intimate Partner Violence: Not At Risk (01/25/2024)   Humiliation, Afraid, Rape, and Kick questionnaire    Fear of Current or Ex-Partner: No    Emotionally Abused: No    Physically Abused: No    Sexually Abused: No    Family History:    Family History  Problem Relation Age of Onset   Hypertension Mother      ROS:  Please see the history of present illness.   All other ROS reviewed and negative.     Physical Exam/Data:   Vitals:   01/22/24 1946 01/22/24 2343 01/23/24 0345 01/23/24 0804  BP: (!) 163/93 112/69 130/74 (!) 166/97  Pulse: 61 64 61 (!) 53  Resp: 18 18 18 18   Temp: 98.4 F (36.9 C) 98.4 F (36.9 C) 98.5 F (36.9 C) 98.1 F (36.7 C)  TempSrc: Oral Oral Oral Oral  SpO2: 99% 97% 97% 97%  Weight:      Height:       No intake or output data in the 24 hours ending 01/25/24 1607    01/25/2024    8:57 AM 01/25/2024    2:15 AM 01/17/2024    3:48 PM  Last 3 Weights  Weight (lbs) 101 lb 9.6 oz 117 lb 117 lb 1 oz  Weight (kg) 46.085 kg 53.071 kg 53.1 kg     Body mass index is 18.89 kg/m.  General:  Well nourished, well developed, in no acute distress HEENT: normal Neck: no JVD Vascular: No carotid bruits; Distal pulses 2+ bilaterally Cardiac:  normal S1, S2; RRR; no murmur  Lungs:  clear to auscultation bilaterally, no wheezing, rhonchi or rales  Abd: soft, nontender, no hepatomegaly  Ext: no edema Musculoskeletal:  No deformities, BUE and BLE strength normal and equal Skin: warm and dry  Neuro:  CNs 2-12 intact, no focal abnormalities noted Psych:  Normal affect   EKG:  The EKG was personally reviewed and demonstrates:  EKG showed normal sinus rhythm with a rate of 87 and LVH Telemetry:  Telemetry was personally reviewed and demonstrates:  Normal sinus rhythm with rates typically in the 60's.  ST  elevation was seen on lead II and III on telemetry.  Relevant CV Studies: Echo pending  Laboratory Data:  High Sensitivity Troponin:   Recent Labs  Lab 01/25/24 0225 01/25/24 0433 01/25/24 0637 01/25/24 0926  TROPONINIHS 25* 32* 40* 59*     Chemistry Recent Labs  Lab 01/20/24 1023 01/21/24 0750 01/22/24 0541 01/25/24 0221  NA 142 141 139 141  K 3.6 3.8 4.0 3.6  CL 105 105 108 106  CO2 30 30 24 26   GLUCOSE 115* 138* 64* 105*  BUN 9 11 6 10   CREATININE 0.81 0.86 0.69 0.85  CALCIUM 8.6* 8.8* 8.2* 9.5  MG 1.6* 2.3 1.5*  --   GFRNONAA >60 >60 >60 >60  ANIONGAP 7 6 7 9     Recent Labs  Lab 01/21/24 0750 01/22/24 0541 01/25/24 0221  PROT 5.8* 4.6* 7.4  ALBUMIN 2.9* 2.3* 3.9  AST 27 22 25   ALT 14 14 24   ALKPHOS 45 36* 53  BILITOT 0.4 0.4 0.6   Lipids No results for input(s): "CHOL", "TRIG", "HDL", "LABVLDL", "LDLCALC", "CHOLHDL" in the last 168 hours.  Hematology Recent Labs  Lab 01/21/24 0750 01/22/24 0541 01/25/24 0225  WBC 6.0 5.8 9.6  RBC 3.41*  3.42* 3.22* 4.12*  HGB 11.0* 10.4* 13.3  HCT 32.6* 30.5* 38.9*  MCV 95.6 94.7 94.4  MCH 32.3 32.3 32.3  MCHC 33.7 34.1 34.2  RDW 13.2 13.2 13.5  PLT 190 188 337   Thyroid No results for input(s): "TSH", "FREET4" in the last 168 hours.  BNPNo results for input(s): "BNP", "PROBNP" in the last 168 hours.  DDimer No results for input(s): "DDIMER" in the last 168 hours.   Radiology/Studies:  CT ABDOMEN PELVIS W CONTRAST Result Date: 01/25/2024 CLINICAL DATA:  Pancreatitis, acute, severe EXAM: CT ABDOMEN AND PELVIS WITH CONTRAST TECHNIQUE: Multidetector CT imaging of the abdomen and pelvis was performed using the standard protocol following bolus administration of intravenous contrast. RADIATION DOSE REDUCTION: This exam was performed according to the departmental dose-optimization program which includes automated exposure control, adjustment of the mA and/or kV according to patient size and/or use of iterative  reconstruction technique. CONTRAST:  75mL OMNIPAQUE IOHEXOL 350 MG/ML SOLN COMPARISON:  01/16/2024 FINDINGS: Lower chest:  No contributory findings. Hepatobiliary: No focal liver abnormality.No evidence of biliary obstruction or stone. Pancreas: Mild expansion and low-density to the pancreatic body and tail, significantly improved. No collection. Spleen: Unremarkable. Adrenals/Urinary Tract: Negative adrenals. No hydronephrosis or ureteral stone. 19 mm cyst at the lower pole left kidney. Punctate interpolar calculus on the left. Unremarkable bladder. Stomach/Bowel:  No obstruction. No visible bowel inflammation Vascular/Lymphatic: No acute vascular abnormality. Extensive in premature atheromatous plaque and luminal irregularity of the aorta and iliacs. No mass or adenopathy. Reproductive:No pathologic findings.  Small left hydrocele. Other: No ascites or pneumoperitoneum. Musculoskeletal: No acute abnormalities. IMPRESSION: Improved pancreatitis.  No collection. 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 Tiny left renal calculus. Electronically Signed   By: Tiburcio Pea M.D.   On: 01/25/2024 06:33   DG Chest 2 View Result Date: 01/25/2024 CLINICAL DATA:  Chest pain. Recent history of seizures and acute pancreatitis. Worsening abdominal pain. EXAM: CHEST - 2 VIEW COMPARISON:  08/23/2023 FINDINGS: Metallic structures projecting over the chest likely from clothing. Heart size and pulmonary vascularity are normal. Emphysematous changes and scarring in the lung apices similar to prior study. No airspace disease or consolidation in the lungs. No pleural effusion or pneumothorax. Mediastinal contours appear intact. IMPRESSION: No active cardiopulmonary disease. Electronically Signed   By: Burman Nieves M.D.   On: 01/25/2024 02:39     Assessment and Plan:   Maxwell Faulkner is a 38 y.o. male with a hx of seizure disorder, acute pancreatitis, anxiety, depression, who is being seen 01/25/2024 for  the evaluation of atypical chest pain at the request of Dorcas Carrow MD.  Atypical chest pain and elevated troponins Has had left-sided chest pain for about 2 days.  The pain is reproducible with palpation and is worse with movement. It is Pleuritic and nonexertional.  The pain is better when he gets  Dilaudid. Labs showed elevated troponin of 25>32>40>59.  EKG showed no acute ischemic changes. -Patient is at an elevated risk for ACS with tobacco use, polysubstance use, aortic and iliac atherosclerosis seen on CT. -Ordered EKG -Echo pending -Chest pain sounds like noncardiac chest pain. The elevated troponin's are likely secondary to demand ischemia from the pancreatitis.  If the echo comes back with a normal LVEF with no regional wall motion abnormalities.  Patient will need no further inpatient ischemic evaluation and can follow-up with cardiology in 4 to 6 weeks.  Acute pancreatitis Had epigastric pain for about 1 week Was hospitalized for acute pancreatitis and discharged on 01/23/2024.  Repeat CT abdomen and pelvis shows improved pancreatitis.  Lipase 101 on 01/25/24 is down from the peak of the last visit at about 180.  Aortic and iliac atherosclerosis -Seen on CT abdomen and pelvis on 01/25/2024  Seizures -On Seroquel -Had a seizure on prior hospitalization -Management per primary  Polysubstance abuse -Denied illicit substance use but has had multiple past urine drug screens positive for THC benzodiazepines and opiates.  Tobacco use -Recommend cessation  Anxiety GERD Depression Management per primary    Risk Assessment/Risk Scores:           :[id number]}            For questions or updates, please contact [facility] Please consult [email or web address] for contact info under    Signed, Arabella Merles, PA-C  01/25/2024 4:07 PM

## 2024-01-25 NOTE — Plan of Care (Signed)

## 2024-01-25 NOTE — ED Notes (Signed)
 Pt trop resulted at 25 - increased pts acuity. Pt remains in lobby awaiting room availability. Pt marked for next available room.

## 2024-01-25 NOTE — Progress Notes (Signed)
  Echocardiogram 2D Echocardiogram has been performed.  Delcie Roch 01/25/2024, 2:36 PM

## 2024-01-25 NOTE — ED Provider Notes (Signed)
 Received signout from overnight team.  See their note for full HPI.  In short, recent admission for pancreatitis has had uptrending troponin. Signed out with repeat troponin pending.  Concern for reactive/pericarditis/myocarditis.  Also having difficulty controlling pain.  Repeat troponin uptrending.  Has required IV narcotic for pain control.  Will admit   Coral Spikes, DO 01/25/24 1610

## 2024-01-25 NOTE — Progress Notes (Deleted)
 Cardiology Consultation    Patient ID: Maxwell Faulkner MRN: [id number]; DOB: [birth year]   Admit date: 01/17/2024 Date of Consult: 01/25/2024   PCP:  Avanell Shackleton, NP-C              [facility] Providers Cardiologist:  None          Patient Profile:    Maxwell Faulkner is a 38 y.o. male with a hx of seizure disorder, acute pancreatitis, anxiety, history of polysubstance abuse, depression, who is being seen 01/25/2024 for the evaluation of atypical chest pain at the request of Dorcas Carrow MD.   History of Present Illness:    Maxwell Faulkner is a 38 year old male with no prior cardiac history presented to the emergency department on 01/25/2024 with epigastric pain (pancreatitis) for 1 week and retrosternal chest pain for 2 days. Was recently admitted to the hospital with seizures and acute pancreatitis that was suspected to be secondary to Depakote.  This was treated based on symptoms and discharged to home on 01/23/24 with pain medications.    Labs showed troponin of 25>32>40>59.  EKG showed normal sinus rhythm with a rate of 87 and LVH.  X-ray showed chronic emphysematous changes but no acute cardiopulmonary disease. Chemistry panel was unremarkable with a creatinine of 0.85.  Blood count unremarkable with a hemoglobin of 13.3. Repeat CT scan of the abdomen showed improved pancreatitis, and atherosclerosis and aorta and iliacs.  Lipase was elevated at 101. It peaked at 181 on the prior hospitalization   On interview patient generally affirms the patient states that the new onset chest pain is left-sided.  The newer retrosternal pain is a deep aching 10/10 intense pain that is better with pain medications and worse with movement. Is currently having the chest pain. The pain was reproducible on palpation and is pleuritic.  Had palpitations and lightheadedness.  Denies using illicit substances but has had multiple urine drug screens positive for THC benzodiazepines and opiates.          Past Medical History:  Diagnosis Date   Anxiety     Depression     Hypertension     Seizures (HCC)                 Past Surgical History:  Procedure Laterality Date   WISDOM TOOTH EXTRACTION                  Inpatient Medications: Scheduled Meds:         Continuous Infusions:       PRN Meds:         Allergies:    Allergies       Allergies  Allergen Reactions   Depakote [Divalproex Sodium] Other (See Comments)      Pancreatitis 12/2023        Social History:   Social History         Socioeconomic History   Marital status: Significant Other      Spouse name: sierra whitsett   Number of children: 3   Years of education: Not on file   Highest education level: Not on file  Occupational History   Not on file  Tobacco Use   Smoking status: Every Day      Current packs/day: 1.00      Types: Cigarettes   Smokeless tobacco: Never  Vaping Use   Vaping status: Never Used  Substance and Sexual Activity   Alcohol use: Not Currently  Comment: stopped drinking   Drug use: Not Currently      Types: Marijuana      Comment: last use 2 weeks ago   Sexual activity: Yes      Birth control/protection: None  Other Topics Concern   Not on file  Social History Narrative    Right handed    Caffeine 1 cup daily    Social Drivers of Health        Financial Resource Strain: Not on file  Food Insecurity: No Food Insecurity (01/25/2024)    Hunger Vital Sign     Worried About Running Out of Food in the Last Year: Never true     Ran Out of Food in the Last Year: Never true  Transportation Needs: No Transportation Needs (01/25/2024)    PRAPARE - Therapist, art (Medical): No     Lack of Transportation (Non-Medical): No  Physical Activity: Not on file  Stress: Not on file  Social Connections: Patient Declined (01/25/2024)    Social Connection and Isolation Panel [NHANES]     Frequency of Communication with Friends and Family:  Patient declined     Frequency of Social Gatherings with Friends and Family: Patient declined     Attends Religious Services: Patient declined     Database administrator or Organizations: Patient declined     Attends Banker Meetings: Patient declined     Marital Status: Patient declined  Intimate Partner Violence: Not At Risk (01/25/2024)    Humiliation, Afraid, Rape, and Kick questionnaire     Fear of Current or Ex-Partner: No     Emotionally Abused: No     Physically Abused: No     Sexually Abused: No    Family History:          Family History  Problem Relation Age of Onset   Hypertension Mother            ROS:  Please see the history of present illness.    All other ROS reviewed and negative.      Physical Exam/Data:          Vitals:    01/22/24 1946 01/22/24 2343 01/23/24 0345 01/23/24 0804  BP: (!) 163/93 112/69 130/74 (!) 166/97  Pulse: 61 64 61 (!) 53  Resp: 18 18 18 18   Temp: 98.4 F (36.9 C) 98.4 F (36.9 C) 98.5 F (36.9 C) 98.1 F (36.7 C)  TempSrc: Oral Oral Oral Oral  SpO2: 99% 97% 97% 97%  Weight:          Height:            No intake or output data in the 24 hours ending 01/25/24 1607     01/25/2024    8:57 AM 01/25/2024    2:15 AM 01/17/2024    3:48 PM  Last 3 Weights  Weight (lbs) 101 lb 9.6 oz 117 lb 117 lb 1 oz  Weight (kg) 46.085 kg 53.071 kg 53.1 kg     Body mass index is 18.89 kg/m.  General:  Well nourished, well developed, in no acute distress HEENT: normal Neck: no JVD Vascular: No carotid bruits; Distal pulses 2+ bilaterally Cardiac:  normal S1, S2; RRR; no murmur  Lungs:  clear to auscultation bilaterally, no wheezing, rhonchi or rales  Abd: soft, nontender, no hepatomegaly  Ext: no edema Musculoskeletal:  No deformities, BUE and BLE strength normal and equal Skin: warm and dry  Neuro:  CNs 2-12 intact, no focal abnormalities noted Psych:  Normal affect    EKG:  The EKG was personally reviewed and  demonstrates:  EKG showed normal sinus rhythm with a rate of 87 and LVH Telemetry:  Telemetry was personally reviewed and demonstrates:  Normal sinus rhythm with rates typically in the 60's.  ST elevation was seen on lead II and III on telemetry.   Relevant CV Studies: Echo pending   Laboratory Data:   High Sensitivity Troponin:   Last Labs        Recent Labs  Lab 01/25/24 0225 01/25/24 0433 01/25/24 0637 01/25/24 0926  TROPONINIHS 25* 32* 40* 59*       Chemistry Last Labs        Recent Labs  Lab 01/20/24 1023 01/21/24 0750 01/22/24 0541 01/25/24 0221  NA 142 141 139 141  K 3.6 3.8 4.0 3.6  CL 105 105 108 106  CO2 30 30 24 26   GLUCOSE 115* 138* 64* 105*  BUN 9 11 6 10   CREATININE 0.81 0.86 0.69 0.85  CALCIUM 8.6* 8.8* 8.2* 9.5  MG 1.6* 2.3 1.5*  --   GFRNONAA >60 >60 >60 >60  ANIONGAP 7 6 7 9       Last Labs       Recent Labs  Lab 01/21/24 0750 01/22/24 0541 01/25/24 0221  PROT 5.8* 4.6* 7.4  ALBUMIN 2.9* 2.3* 3.9  AST 27 22 25   ALT 14 14 24   ALKPHOS 45 36* 53  BILITOT 0.4 0.4 0.6      Lipids  Last Labs  No results for input(s): "CHOL", "TRIG", "HDL", "LABVLDL", "LDLCALC", "CHOLHDL" in the last 168 hours.    Hematology Last Labs       Recent Labs  Lab 01/21/24 0750 01/22/24 0541 01/25/24 0225  WBC 6.0 5.8 9.6  RBC 3.41*  3.42* 3.22* 4.12*  HGB 11.0* 10.4* 13.3  HCT 32.6* 30.5* 38.9*  MCV 95.6 94.7 94.4  MCH 32.3 32.3 32.3  MCHC 33.7 34.1 34.2  RDW 13.2 13.2 13.5  PLT 190 188 337      Thyroid  Last Labs  No results for input(s): "TSH", "FREET4" in the last 168 hours.    BNP Last Labs  No results for input(s): "BNP", "PROBNP" in the last 168 hours.    DDimer  Last Labs  No results for input(s): "DDIMER" in the last 168 hours.       Radiology/Studies:  CT ABDOMEN PELVIS W CONTRAST Result Date: 01/25/2024 CLINICAL DATA:  Pancreatitis, acute, severe EXAM: CT ABDOMEN AND PELVIS WITH CONTRAST TECHNIQUE: Multidetector CT imaging  of the abdomen and pelvis was performed using the standard protocol following bolus administration of intravenous contrast. RADIATION DOSE REDUCTION: This exam was performed according to the departmental dose-optimization program which includes automated exposure control, adjustment of the mA and/or kV according to patient size and/or use of iterative reconstruction technique. CONTRAST:  75mL OMNIPAQUE IOHEXOL 350 MG/ML SOLN COMPARISON:  01/16/2024 FINDINGS: Lower chest:  No contributory findings. Hepatobiliary: No focal liver abnormality.No evidence of biliary obstruction or stone. Pancreas: Mild expansion and low-density to the pancreatic body and tail, significantly improved. No collection. Spleen: Unremarkable. Adrenals/Urinary Tract: Negative adrenals. No hydronephrosis or ureteral stone. 19 mm cyst at the lower pole left kidney. Punctate interpolar calculus on the left. Unremarkable bladder. Stomach/Bowel:  No obstruction. No visible bowel inflammation Vascular/Lymphatic: No acute vascular abnormality. Extensive in premature atheromatous plaque and luminal irregularity of the aorta and iliacs. No mass or adenopathy. Reproductive:No  pathologic findings.  Small left hydrocele. Other: No ascites or pneumoperitoneum. Musculoskeletal: No acute abnormalities. IMPRESSION: Improved pancreatitis.  No collection. 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 Tiny left renal calculus. Electronically Signed   By: Tiburcio Pea M.D.   On: 01/25/2024 06:33    DG Chest 2 View Result Date: 01/25/2024 CLINICAL DATA:  Chest pain. Recent history of seizures and acute pancreatitis. Worsening abdominal pain. EXAM: CHEST - 2 VIEW COMPARISON:  08/23/2023 FINDINGS: Metallic structures projecting over the chest likely from clothing. Heart size and pulmonary vascularity are normal. Emphysematous changes and scarring in the lung apices similar to prior study. No airspace disease or consolidation in the lungs. No pleural  effusion or pneumothorax. Mediastinal contours appear intact. IMPRESSION: No active cardiopulmonary disease. Electronically Signed   By: Burman Nieves M.D.   On: 01/25/2024 02:39        Assessment and Plan:    Maxwell Faulkner is a 38 y.o. male with a hx of seizure disorder, acute pancreatitis, anxiety, depression, who is being seen 01/25/2024 for the evaluation of atypical chest pain at the request of Dorcas Carrow MD.   Atypical chest pain and elevated troponins Has had left-sided chest pain for about 2 days.  The pain is reproducible with palpation and is worse with movement. It is Pleuritic and nonexertional.  The pain is better when he gets Dilaudid. Labs showed elevated troponin of 25>32>40>59.  EKG showed no acute ischemic changes. -Patient is at an elevated risk for ACS with tobacco use, polysubstance use, aortic and iliac atherosclerosis seen on CT. -Ordered EKG -Echo pending -Chest pain sounds like noncardiac chest pain. The elevated troponin's are likely secondary to demand ischemia from the pancreatitis.  If the echo comes back with a normal LVEF with no regional wall motion abnormalities.  Patient will need no further inpatient ischemic evaluation and can follow-up with cardiology in 4 to 6 weeks.   Acute pancreatitis Had epigastric pain for about 1 week Was hospitalized for acute pancreatitis and discharged on 01/23/2024.  Repeat CT abdomen and pelvis shows improved pancreatitis.  Lipase 101 on 01/25/24 is down from the peak of the last visit at about 180.   Aortic and iliac atherosclerosis -Seen on CT abdomen and pelvis on 01/25/2024   Seizures -On Seroquel -Had a seizure on prior hospitalization -Management per primary   Polysubstance abuse -Denied illicit substance use but has had multiple past urine drug screens positive for THC benzodiazepines and opiates.   Tobacco use -Recommend cessation   Anxiety GERD Depression Management per primary       Risk  Assessment/Risk Scores:                     For questions or updates, please contact [facility] Please consult [email or web address] for contact info under      Signed, Arabella Merles, PA-C  01/25/2024 4:07 PM

## 2024-01-25 NOTE — ED Notes (Signed)
 Patient transported to CT

## 2024-01-25 NOTE — ED Provider Notes (Signed)
 [place] [facility] AT [facility] Provider Note  CSN: [id number] Arrival date & time: 01/25/24 0208  Chief Complaint(s) Chest Pain  HPI Maxwell Faulkner is a 38 y.o. male with a past medical history listed below including recent admission for pancreatitis who returns to the emergency department for persistent abdominal pain not relieved by prescribed Percocets.  Patient reports that he now has developed chest pain described as a deep ache.  Pain is nonradiating nonexertional.  Associated with lightheadedness.  Some nausea without emesis.  No coughing or congestion.  No shortness of breath.  The history is provided by the patient.    Past Medical History Past Medical History:  Diagnosis Date   Anxiety    Depression    Hypertension    Seizures (HCC)    Patient Active Problem List   Diagnosis Date Noted   Pancreatitis 01/17/2024   Seizures (HCC) 01/17/2024   Acute pancreatitis 01/17/2024   Abnormal CT scan, gallbladder 01/11/2024   BRBPR (bright red blood per rectum) 01/11/2024   Right upper quadrant abdominal tenderness 01/11/2024   History of colitis 01/11/2024   Aortic atherosclerosis (HCC) 01/11/2024   Anemia 01/11/2024   Right lower quadrant abdominal tenderness without rebound tenderness 01/11/2024   Generalized abdominal pain 01/11/2024   Pulmonary emphysema (HCC) 01/11/2024   Home Medication(s) Prior to Admission medications   Medication Sig Start Date End Date Taking? Authorizing Provider  lacosamide (VIMPAT) 200 MG TABS tablet Take 1 tablet (200 mg total) by mouth 2 (two) times daily. 01/23/24   Hughie Closs, MD  ondansetron (ZOFRAN-ODT) 4 MG disintegrating tablet Take 1 tablet (4 mg total) by mouth every 8 (eight) hours as needed for nausea or vomiting. 01/16/24   Smitty Knudsen, PA-C  oxyCODONE-acetaminophen (PERCOCET/ROXICET) 5-325 MG tablet Take 1 tablet by mouth every 6 (six) hours as needed for severe pain (pain score 7-10). 01/23/24    Hughie Closs, MD  QUEtiapine (SEROQUEL) 50 MG tablet Take 1 tablet (50 mg total) by mouth at bedtime. 12/14/23   Lauro Franklin, MD  sertraline (ZOLOFT) 50 MG tablet Take 1 tablet (50 mg total) by mouth daily. 12/14/23   Lauro Franklin, MD                                                                                                                                    Allergies Depakote [divalproex sodium]  Review of Systems Review of Systems As noted in HPI  Physical Exam Vital Signs  I have reviewed the triage vital signs BP (!) 153/100   Pulse (!) 58   Temp 98.7 F (37.1 C)   Resp 18   Ht 5\' 6"  (1.676 m)   Wt 53.1 kg   SpO2 100%   BMI 18.88 kg/m   Physical Exam Vitals reviewed.  Constitutional:      General: He is not in acute distress.    Appearance: He  is well-developed. He is not diaphoretic.  HENT:     Head: Normocephalic and atraumatic.     Nose: Nose normal.  Eyes:     General: No scleral icterus.       Right eye: No discharge.        Left eye: No discharge.     Conjunctiva/sclera: Conjunctivae normal.     Pupils: Pupils are equal, round, and reactive to light.  Cardiovascular:     Rate and Rhythm: Normal rate and regular rhythm.     Heart sounds: No murmur heard.    No friction rub. No gallop.  Pulmonary:     Effort: Pulmonary effort is normal. No respiratory distress.     Breath sounds: Normal breath sounds. No stridor. No rales.  Abdominal:     General: There is no distension.     Palpations: Abdomen is soft.     Tenderness: There is generalized abdominal tenderness. There is guarding. There is no rebound.  Musculoskeletal:        General: No tenderness.     Cervical back: Normal range of motion and neck supple.  Skin:    General: Skin is warm and dry.     Findings: No erythema or rash.  Neurological:     Mental Status: He is alert and oriented to person, place, and time.     ED Results and Treatments Labs (all labs ordered are  listed, but only abnormal results are displayed) Labs Reviewed  CBC - Abnormal; Notable for the following components:      Result Value   RBC 4.12 (*)    HCT 38.9 (*)    All other components within normal limits  LIPASE, BLOOD - Abnormal; Notable for the following components:   Lipase 101 (*)    All other components within normal limits  COMPREHENSIVE METABOLIC PANEL WITH GFR - Abnormal; Notable for the following components:   Glucose, Bld 105 (*)    All other components within normal limits  CBG MONITORING, ED - Abnormal; Notable for the following components:   Glucose-Capillary 105 (*)    All other components within normal limits  TROPONIN I (HIGH SENSITIVITY) - Abnormal; Notable for the following components:   Troponin I (High Sensitivity) 25 (*)    All other components within normal limits  TROPONIN I (HIGH SENSITIVITY) - Abnormal; Notable for the following components:   Troponin I (High Sensitivity) 32 (*)    All other components within normal limits  URINALYSIS, ROUTINE W REFLEX MICROSCOPIC                                                                                                                         EKG  EKG Interpretation Date/Time:  Friday January 25 2024 02:20:16 EDT Ventricular Rate:  87 PR Interval:  124 QRS Duration:  84 QT Interval:  350 QTC Calculation: 421 R Axis:   82  Text Interpretation: Normal sinus rhythm with sinus arrhythmia Normal ECG When compared  with ECG of 22-Jan-2024 11:37, PREVIOUS ECG IS PRESENT No significant change was found Confirmed by Drema Pry [phone]) on 01/25/2024 4:54:19 AM       Radiology DG Chest 2 View Result Date: 01/25/2024 CLINICAL DATA:  Chest pain. Recent history of seizures and acute pancreatitis. Worsening abdominal pain. EXAM: CHEST - 2 VIEW COMPARISON:  08/23/2023 FINDINGS: Metallic structures projecting over the chest likely from clothing. Heart size and pulmonary vascularity are normal. Emphysematous changes and  scarring in the lung apices similar to prior study. No airspace disease or consolidation in the lungs. No pleural effusion or pneumothorax. Mediastinal contours appear intact. IMPRESSION: No active cardiopulmonary disease. Electronically Signed   By: Burman Nieves M.D.   On: 01/25/2024 02:39    Medications Ordered in ED Medications  sodium chloride 0.9 % bolus 1,000 mL (has no administration in time range)  alum & mag hydroxide-simeth (MAALOX/MYLANTA) 200-200-20 MG/5ML suspension 30 mL (30 mLs Oral Given 01/25/24 0531)    And  lidocaine (XYLOCAINE) 2 % viscous mouth solution 15 mL (15 mLs Oral Given 01/25/24 0531)  pantoprazole (PROTONIX) injection 40 mg (40 mg Intravenous Given 01/25/24 0532)  prochlorperazine (COMPAZINE) injection 10 mg (10 mg Intravenous Given 01/25/24 0532)   Procedures Procedures  (including critical care time) Medical Decision Making / ED Course   Medical Decision Making Amount and/or Complexity of Data Reviewed Labs: ordered. Decision-making details documented in ED Course. Radiology: ordered and independent interpretation performed. Decision-making details documented in ED Course. ECG/medicine tests: ordered and independent interpretation performed. Decision-making details documented in ED Course.  Risk OTC drugs. Prescription drug management.    Differential diagnosis considered and workup below:  Chest pain Atypical for ACS.  EKG without acute ischemic changes or evidence of pericarditis.  Initial troponin was slightly elevated at 25.  Delta troponin is trending up at 32.  Delta has not significant for ACS.  Possible reactive from pancreatitis.  Will repeat 3rd trop. Low suspicion for pulmonary embolism.  Presentation not classic for arctic dissection or esophageal perforation. Chest x-ray without evidence of pneumonia, pneumothorax, pulmonary edema pleural effusions. No severe anemia noted.  Abdominal pain CBC without leukocytosis.  Lipase 100,  improved from prior.  No electrolyte derangements or renal insufficiency.  No evidence of bili obstruction. UA without evidence of infection. CT scan shows improving pancreatitis. No other acute findings.    Patient care turned over to oncoming provider. Patient case and results discussed in detail; please see their note for further ED managment.     Final Clinical Impression(s) / ED Diagnoses Final diagnoses:  None    This chart was dictated using voice recognition software.  Despite best efforts to proofread,  errors can occur which can change the documentation meaning.    Nira Conn, MD 01/25/24 [phone]

## 2024-01-25 NOTE — ED Triage Notes (Signed)
 Pt was discharge on 3/26 following admission for seizures and acute pancreatitis. He returns today d/t feeling like he's going to have a seizure and worsening abdominal pain. Pt reports concern for centralized discomfort like chest pain that started tonight. Pain non radiating. CP associated with lightheadedness. Last took prescribed lacosamide yesterday at 10 pm.

## 2024-01-25 NOTE — Consult Note (Addendum)
 Cardiology Consultation    Patient ID: Maxwell Faulkner MRN: [id number]; DOB: 05/19/1986   Admit date: 01/17/2024 Date of Consult: 01/25/2024   PCP:  Avanell Shackleton, NP-C              [facility] Providers Cardiologist:  None          Patient Profile:    Maxwell Faulkner is a 38 y.o. male with a hx of seizure disorder, acute pancreatitis, anxiety, history of polysubstance abuse, depression, who is being seen 01/25/2024 for the evaluation of atypical chest pain at the request of Dorcas Carrow MD.   History of Present Illness:    Maxwell Faulkner is a 39 year old male with no prior cardiac history presented to the emergency department on 01/25/2024 with epigastric pain (pancreatitis) for 1 week and retrosternal chest pain for 2 days. Was recently admitted to the hospital with seizures and acute pancreatitis that was suspected to be secondary to Depakote.  This was treated based on symptoms and discharged to home on 01/23/24 with pain medications.    Labs showed troponin of 25>32>40>59.  EKG showed normal sinus rhythm with a rate of 87 and LVH.  X-ray showed chronic emphysematous changes but no acute cardiopulmonary disease. Chemistry panel was unremarkable with a creatinine of 0.85.  Blood count unremarkable with a hemoglobin of 13.3. Repeat CT scan of the abdomen showed improved pancreatitis, and atherosclerosis and aorta and iliacs.  Lipase was elevated at 101. It peaked at 181 on the prior hospitalization   On interview patient generally affirms the patient states that the new onset chest pain is left-sided.  The newer retrosternal pain is a deep aching 10/10 intense pain that is better with pain medications and worse with movement. Is currently having the chest pain. The pain was reproducible on palpation and is pleuritic.  Had palpitations and lightheadedness.  Denies using illicit substances but has had multiple urine drug screens positive for THC benzodiazepines and opiates.             Past Medical History:  Diagnosis Date   Anxiety     Depression     Hypertension     Seizures (HCC)                      Past Surgical History:  Procedure Laterality Date   WISDOM TOOTH EXTRACTION                   Inpatient Medications: Scheduled Meds:           Continuous Infusions:         PRN Meds:           Allergies:    Allergies           Allergies  Allergen Reactions   Depakote [Divalproex Sodium] Other (See Comments)      Pancreatitis 12/2023        Social History:   Social History             Socioeconomic History   Marital status: Significant Other      Spouse name: sierra whitsett   Number of children: 3   Years of education: Not on file   Highest education level: Not on file  Occupational History   Not on file  Tobacco Use   Smoking status: Every Day      Current packs/day: 1.00      Types: Cigarettes   Smokeless tobacco: Never  Vaping Use   Vaping status: Never Used  Substance and Sexual Activity   Alcohol use: Not Currently      Comment: stopped drinking   Drug use: Not Currently      Types: Marijuana      Comment: last use 2 weeks ago   Sexual activity: Yes      Birth control/protection: None  Other Topics Concern   Not on file  Social History Narrative    Right handed    Caffeine 1 cup daily    Social Drivers of Health           Financial Resource Strain: Not on file  Food Insecurity: No Food Insecurity (01/25/2024)    Hunger Vital Sign     Worried About Running Out of Food in the Last Year: Never true     Ran Out of Food in the Last Year: Never true  Transportation Needs: No Transportation Needs (01/25/2024)    PRAPARE - Therapist, art (Medical): No     Lack of Transportation (Non-Medical): No  Physical Activity: Not on file  Stress: Not on file  Social Connections: Patient Declined (01/25/2024)    Social Connection and Isolation Panel [NHANES]     Frequency of Communication with  Friends and Family: Patient declined     Frequency of Social Gatherings with Friends and Family: Patient declined     Attends Religious Services: Patient declined     Database administrator or Organizations: Patient declined     Attends Banker Meetings: Patient declined     Marital Status: Patient declined  Intimate Partner Violence: Not At Risk (01/25/2024)    Humiliation, Afraid, Rape, and Kick questionnaire     Fear of Current or Ex-Partner: No     Emotionally Abused: No     Physically Abused: No     Sexually Abused: No    Family History:              Family History  Problem Relation Age of Onset   Hypertension Mother             ROS:  Please see the history of present illness.    All other ROS reviewed and negative.      Physical Exam/Data:               Vitals:    01/22/24 1946 01/22/24 2343 01/23/24 0345 01/23/24 0804  BP: (!) 163/93 112/69 130/74 (!) 166/97  Pulse: 61 64 61 (!) 53  Resp: 18 18 18 18   Temp: 98.4 F (36.9 C) 98.4 F (36.9 C) 98.5 F (36.9 C) 98.1 F (36.7 C)  TempSrc: Oral Oral Oral Oral  SpO2: 99% 97% 97% 97%  Weight:          Height:            No intake or output data in the 24 hours ending 01/25/24 1607     01/25/2024    8:57 AM 01/25/2024    2:15 AM 01/17/2024    3:48 PM  Last 3 Weights  Weight (lbs) 101 lb 9.6 oz 117 lb 117 lb 1 oz  Weight (kg) 46.085 kg 53.071 kg 53.1 kg     Body mass index is 18.89 kg/m.  General:  Well nourished, well developed, in no acute distress HEENT: normal Neck: no JVD Vascular: No carotid bruits; Distal pulses 2+ bilaterally Cardiac:  normal S1, S2; RRR; no murmur  Lungs:  clear to auscultation bilaterally, no wheezing, rhonchi or rales  Abd: soft, nontender, no hepatomegaly  Ext: no edema Musculoskeletal:  No deformities, BUE and BLE strength normal and equal Skin: warm and dry  Neuro:  CNs 2-12 intact, no focal abnormalities noted Psych:  Normal affect    EKG:  The EKG was  personally reviewed and demonstrates:  EKG showed normal sinus rhythm with a rate of 87 and LVH Telemetry:  Telemetry was personally reviewed and demonstrates:  Normal sinus rhythm with rates typically in the 60's.  ST elevation was seen on lead II and III on telemetry.   Relevant CV Studies: Echo pending   Laboratory Data:   High Sensitivity Troponin:   Last Labs             Recent Labs  Lab 01/25/24 0225 01/25/24 0433 01/25/24 0637 01/25/24 0926  TROPONINIHS 25* 32* 40* 59*       Chemistry Last Labs             Recent Labs  Lab 01/20/24 1023 01/21/24 0750 01/22/24 0541 01/25/24 0221  NA 142 141 139 141  K 3.6 3.8 4.0 3.6  CL 105 105 108 106  CO2 30 30 24 26   GLUCOSE 115* 138* 64* 105*  BUN 9 11 6 10   CREATININE 0.81 0.86 0.69 0.85  CALCIUM 8.6* 8.8* 8.2* 9.5  MG 1.6* 2.3 1.5*  --   GFRNONAA >60 >60 >60 >60  ANIONGAP 7 6 7 9       Last Labs           Recent Labs  Lab 01/21/24 0750 01/22/24 0541 01/25/24 0221  PROT 5.8* 4.6* 7.4  ALBUMIN 2.9* 2.3* 3.9  AST 27 22 25   ALT 14 14 24   ALKPHOS 45 36* 53  BILITOT 0.4 0.4 0.6      Lipids  Last Labs  No results for input(s): "CHOL", "TRIG", "HDL", "LABVLDL", "LDLCALC", "CHOLHDL" in the last 168 hours.    Hematology Last Labs           Recent Labs  Lab 01/21/24 0750 01/22/24 0541 01/25/24 0225  WBC 6.0 5.8 9.6  RBC 3.41*  3.42* 3.22* 4.12*  HGB 11.0* 10.4* 13.3  HCT 32.6* 30.5* 38.9*  MCV 95.6 94.7 94.4  MCH 32.3 32.3 32.3  MCHC 33.7 34.1 34.2  RDW 13.2 13.2 13.5  PLT 190 188 337      Thyroid  Last Labs  No results for input(s): "TSH", "FREET4" in the last 168 hours.    BNP Last Labs  No results for input(s): "BNP", "PROBNP" in the last 168 hours.    DDimer  Last Labs  No results for input(s): "DDIMER" in the last 168 hours.        Radiology/Studies:  CT ABDOMEN PELVIS W CONTRAST Result Date: 01/25/2024 CLINICAL DATA:  Pancreatitis, acute, severe EXAM: CT ABDOMEN AND PELVIS WITH  CONTRAST TECHNIQUE: Multidetector CT imaging of the abdomen and pelvis was performed using the standard protocol following bolus administration of intravenous contrast. RADIATION DOSE REDUCTION: This exam was performed according to the departmental dose-optimization program which includes automated exposure control, adjustment of the mA and/or kV according to patient size and/or use of iterative reconstruction technique. CONTRAST:  75mL OMNIPAQUE IOHEXOL 350 MG/ML SOLN COMPARISON:  01/16/2024 FINDINGS: Lower chest:  No contributory findings. Hepatobiliary: No focal liver abnormality.No evidence of biliary obstruction or stone. Pancreas: Mild expansion and low-density to the pancreatic body and tail, significantly improved. No collection. Spleen: Unremarkable. Adrenals/Urinary  Tract: Negative adrenals. No hydronephrosis or ureteral stone. 19 mm cyst at the lower pole left kidney. Punctate interpolar calculus on the left. Unremarkable bladder. Stomach/Bowel:  No obstruction. No visible bowel inflammation Vascular/Lymphatic: No acute vascular abnormality. Extensive in premature atheromatous plaque and luminal irregularity of the aorta and iliacs. No mass or adenopathy. Reproductive:No pathologic findings.  Small left hydrocele. Other: No ascites or pneumoperitoneum. Musculoskeletal: No acute abnormalities. IMPRESSION: Improved pancreatitis.  No collection. 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 Tiny left renal calculus. Electronically Signed   By: Tiburcio Pea M.D.   On: 01/25/2024 06:33    DG Chest 2 View Result Date: 01/25/2024 CLINICAL DATA:  Chest pain. Recent history of seizures and acute pancreatitis. Worsening abdominal pain. EXAM: CHEST - 2 VIEW COMPARISON:  08/23/2023 FINDINGS: Metallic structures projecting over the chest likely from clothing. Heart size and pulmonary vascularity are normal. Emphysematous changes and scarring in the lung apices similar to prior study. No airspace  disease or consolidation in the lungs. No pleural effusion or pneumothorax. Mediastinal contours appear intact. IMPRESSION: No active cardiopulmonary disease. Electronically Signed   By: Burman Nieves M.D.   On: 01/25/2024 02:39        Assessment and Plan:    Maxwell Faulkner is a 38 y.o. male with a hx of seizure disorder, acute pancreatitis, anxiety, depression, who is being seen 01/25/2024 for the evaluation of atypical chest pain at the request of Dorcas Carrow MD.   Atypical chest pain and elevated troponins Has had left-sided chest pain for about 2 days.  The pain is reproducible with palpation and is worse with movement. It is Pleuritic and nonexertional.   Labs showed elevated troponin of 25>32>40>59.  EKG showed no acute ischemic changes. -Patient does have CAD risk factors with tobacco use, and noted to have aortic and iliac atherosclerosis seen on CT. -Normal echo -Chest pain sounds like noncardiac chest pain. The elevated troponin's are likely secondary to demand ischemia from the pancreatitis.  If the echo comes back with a normal LVEF with no regional wall motion abnormalities.  Patient will need no further inpatient ischemic evaluation and can follow-up with cardiology in 4 to 6 weeks; can consider outpatient coronary CTA once pancreatitis issues resolved   Acute pancreatitis Had epigastric pain for about 1 week Was hospitalized for acute pancreatitis and discharged on 01/23/2024.  Repeat CT abdomen and pelvis shows improved pancreatitis.  Lipase 101 on 01/25/24 is down from the peak of the last visit at about 180.   Aortic and iliac atherosclerosis -Seen on CT abdomen and pelvis on 01/25/2024   Seizures -On Seroquel -Had a seizure on prior hospitalization -Management per primary   Polysubstance abuse -Denied illicit substance use but has had multiple past urine drug screens positive for THC benzodiazepines and opiates.   Tobacco use -Recommend cessation    Anxiety GERD Depression Management per primary  [facility] will sign off.   Medication Recommendations:  No changes Other recommendations (labs, testing, etc):  None Follow up as an outpatient:  Will schedule        Risk Assessment/Risk Scores:          For questions or updates, please contact [facility] Please consult [email or web address] for contact info under      Signed, Arabella Merles, PA-C  01/25/2024 4:07 PM   Patient seen and examined.  Agree with above documentation.  Maxwell Faulkner is a 38 year old male with a history of polysubstance abuse, seizures,  acute pancreatitis who we are consulted by Dr. Jerral Ralph for evaluation of chest pain.  Patient has no prior cardiac history.  He had recent admission with acute pancreatitis thought to be caused by Depakote.  He presented back to ED today with epigastric and chest pain.  In ED, vitals notable for BP 145/111, pulse 85, SpO2 100% on room air.  Labs notable for creatinine 0.85, lipase 101, troponin 25 > 32 > 40 >59, WBC 9.6, hemoglobin 13.3.  EKG shows normal sinus rhythm, rate 64, right axis deviation, no ST abnormalities.  CT abdomen pelvis showed improved pancreatitis but noted to have advanced for age atherosclerosis of aorta.  He reports chest pain is pleuritic, describes sharp pain that is worse with inspiration.  Not related to position.  He is also tender to palpation on left side of chest.   On exam, patient is alert and oriented, regular rate and rhythm, no murmurs, lungs CTAB, no LE edema or JVD.  For his chest pain, suspect noncardiac pain given description, likely MSK pain given tenderness to palpation.  Suspect troponin elevation represents demand ischemia in setting of acute pancreatitis.  However does have CAD risk factors and noted to have aortic atherosclerosis despite young age.  Plan outpatient follow-up and can consider coronary CTA as outpatient once pancreatitis issues have resolved.  Maxwell Ishikawa,  MD

## 2024-01-25 NOTE — H&P (Signed)
 History and Physical    RAYNELL SCOTT XBM:[id number] DOB: 18-Sep-1986 DOA: 01/25/2024  PCP: Avanell Shackleton, NP-C  Patient coming from: Home  I have personally briefly reviewed patient's old medical records available.   Chief Complaint: Epigastric pain, chest pain ongoing since last week.  HPI: Maxwell BURMESTER is a 38 y.o. male with medical history significant of anxiety, depression, seizure disorder who was recently admitted to the hospital with acute pancreatitis primary cause unknown and thought to be caused by Depakote.  He was treated symptomatically and discharged home on oral pain medications 3/26.  Patient tells me that he continued to have abdominal pain despite taking the pain medication.  He denies any nausea vomiting.  Abdominal pain is mostly in the epigastric area, 10 out of 10 at times.  No radiation of the pain however since yesterday he has been noticing pain mostly on the retrosternal or chest area.  Describes deep ache, relieved with pain medication, worsened with movement.  Occasional lightheadedness.  No vomiting.  Denies any fever or chills shortness of breath cough.  Having regular bowel movements.  Denies doing any alcohol or drugs. ED Course: Hemodynamically stable.  Electrolytes are adequate.  Lipase is 100.  Troponin was 25.  Repeat CT scan abdomen pelvis with improving pancreatic inflammation.  EKG and repeat EKG are without any evidence of ischemic changes.  Patient was given multiple rounds of pain medications and emergency room and admission requested.  Review of Systems: all systems are reviewed and pertinent positive as per HPI otherwise rest are negative.    Past Medical History:  Diagnosis Date   Anxiety    Depression    Hypertension    Seizures (HCC)     Past Surgical History:  Procedure Laterality Date   WISDOM TOOTH EXTRACTION      Social history   reports that he has been smoking cigarettes. He has never used smokeless tobacco. He reports that he  does not currently use alcohol. He reports that he does not currently use drugs after having used the following drugs: Marijuana.  Allergies  Allergen Reactions   Depakote [Divalproex Sodium] Other (See Comments)    Pancreatitis 12/2023    Family History  Problem Relation Age of Onset   Hypertension Mother      Prior to Admission medications   Medication Sig Start Date End Date Taking? Authorizing Provider  lacosamide (VIMPAT) 200 MG TABS tablet Take 1 tablet (200 mg total) by mouth 2 (two) times daily. 01/23/24  Yes Pahwani, Daleen Bo, MD  ondansetron (ZOFRAN-ODT) 4 MG disintegrating tablet Take 1 tablet (4 mg total) by mouth every 8 (eight) hours as needed for nausea or vomiting. 01/16/24  Yes Smitty Knudsen, PA-C  oxyCODONE-acetaminophen (PERCOCET/ROXICET) 5-325 MG tablet Take 1 tablet by mouth every 6 (six) hours as needed for severe pain (pain score 7-10). 01/23/24  Yes Pahwani, Daleen Bo, MD  QUEtiapine (SEROQUEL) 50 MG tablet Take 1 tablet (50 mg total) by mouth at bedtime. 12/14/23  Yes Pashayan, Mardelle Matte, MD  sertraline (ZOLOFT) 50 MG tablet Take 1 tablet (50 mg total) by mouth daily. 12/14/23  Yes Lauro Franklin, MD    Physical Exam: Vitals:   01/25/24 0800 01/25/24 0830 01/25/24 0857 01/25/24 0900  BP: (!) 147/102 (!) 146/95  (!) 153/93  Pulse: 60 (!) 59  63  Resp: 12 17  18   Temp:    98.3 F (36.8 C)  TempSrc:    Oral  SpO2: 100% 99%  98%  Weight:   46.1 kg   Height:   5\' 6"  (1.676 m)     Constitutional: NAD, calm, mild discomfort on exam.  Otherwise looks comfortable. Vitals:   01/25/24 0800 01/25/24 0830 01/25/24 0857 01/25/24 0900  BP: (!) 147/102 (!) 146/95  (!) 153/93  Pulse: 60 (!) 59  63  Resp: 12 17  18   Temp:    98.3 F (36.8 C)  TempSrc:    Oral  SpO2: 100% 99%  98%  Weight:   46.1 kg   Height:   5\' 6"  (1.676 m)    Eyes: PERRL, lids and conjunctivae normal ENMT: Mucous membranes are moist. Posterior pharynx clear of any exudate or lesions.Normal  dentition.  Neck: normal, supple, no masses, no thyromegaly Respiratory: clear to auscultation bilaterally, no wheezing, no crackles. Normal respiratory effort. No accessory muscle use.  Cardiovascular: Regular rate and rhythm, no murmurs / rubs / gallops. No extremity edema. 2+ pedal pulses. No carotid bruits.  Abdomen: Mild epigastric tenderness on deep palpation.  No masses palpated. No hepatosplenomegaly. Bowel sounds positive.  Musculoskeletal: no clubbing / cyanosis. No joint deformity upper and lower extremities. Good ROM, no contractures. Normal muscle tone.  Skin: no rashes, lesions, ulcers. No induration Neurologic: CN 2-12 grossly intact. Sensation intact, DTR normal. Strength 5/5 in all 4.  Psychiatric: Normal judgment and insight. Alert and oriented x 3. Normal mood.     Labs on Admission: I have personally reviewed following labs and imaging studies  CBC: Recent Labs  Lab 01/20/24 0656 01/21/24 0750 01/22/24 0541 01/25/24 0225  WBC 6.2 6.0 5.8 9.6  NEUTROABS 2.8 3.4 3.7  --   HGB 10.3* 11.0* 10.4* 13.3  HCT 30.0* 32.6* 30.5* 38.9*  MCV 94.6 95.6 94.7 94.4  PLT 172 190 188 337   Basic Metabolic Panel: Recent Labs  Lab 01/20/24 0656 01/20/24 1023 01/21/24 0750 01/22/24 0541 01/25/24 0221  NA 139 142 141 139 141  K 3.7 3.6 3.8 4.0 3.6  CL 105 105 105 108 106  CO2 31 30 30 24 26   GLUCOSE 92 115* 138* 64* 105*  BUN 8 9 11 6 10   CREATININE 0.77 0.81 0.86 0.69 0.85  CALCIUM 8.4* 8.6* 8.8* 8.2* 9.5  MG  --  1.6* 2.3 1.5*  --   PHOS  --  3.5 3.6 3.3  --    GFR: Estimated Creatinine Clearance: 76.1 mL/min (by C-G formula based on SCr of 0.85 mg/dL). Liver Function Tests: Recent Labs  Lab 01/20/24 0656 01/20/24 1023 01/21/24 0750 01/22/24 0541 01/25/24 0221  AST 32 30 27 22 25   ALT 15 14 14 14 24   ALKPHOS 42 43 45 36* 53  BILITOT 0.4 0.5 0.4 0.4 0.6  PROT 5.2* 5.1* 5.8* 4.6* 7.4  ALBUMIN 2.6* 2.5* 2.9* 2.3* 3.9   Recent Labs  Lab 01/20/24 0656  01/20/24 1023 01/21/24 0750 01/22/24 0541 01/25/24 0225  LIPASE 131* 175* 181* 108* 101*   No results for input(s): "AMMONIA" in the last 168 hours. Coagulation Profile: No results for input(s): "INR", "PROTIME" in the last 168 hours. Cardiac Enzymes: No results for input(s): "CKTOTAL", "CKMB", "CKMBINDEX", "TROPONINI" in the last 168 hours. BNP (last 3 results) No results for input(s): "PROBNP" in the last 8760 hours. HbA1C: No results for input(s): "HGBA1C" in the last 72 hours. CBG: Recent Labs  Lab 01/21/24 2159 01/25/24 0226  GLUCAP 88 105*   Lipid Profile: No results for input(s): "CHOL", "HDL", "LDLCALC", "TRIG", "CHOLHDL", "LDLDIRECT" in the last 72  hours. Thyroid Function Tests: No results for input(s): "TSH", "T4TOTAL", "FREET4", "T3FREE", "THYROIDAB" in the last 72 hours. Anemia Panel: No results for input(s): "VITAMINB12", "FOLATE", "FERRITIN", "TIBC", "IRON", "RETICCTPCT" in the last 72 hours. Urine analysis:    Component Value Date/Time   COLORURINE YELLOW 01/25/2024 0433   APPEARANCEUR CLEAR 01/25/2024 0433   LABSPEC 1.023 01/25/2024 0433   PHURINE 5.0 01/25/2024 0433   GLUCOSEU NEGATIVE 01/25/2024 0433   HGBUR NEGATIVE 01/25/2024 0433   BILIRUBINUR NEGATIVE 01/25/2024 0433   KETONESUR NEGATIVE 01/25/2024 0433   PROTEINUR NEGATIVE 01/25/2024 0433   UROBILINOGEN 0.2 09/17/2020 1031   NITRITE NEGATIVE 01/25/2024 0433   LEUKOCYTESUR NEGATIVE 01/25/2024 0433    Radiological Exams on Admission: CT ABDOMEN PELVIS W CONTRAST Result Date: 01/25/2024 CLINICAL DATA:  Pancreatitis, acute, severe EXAM: CT ABDOMEN AND PELVIS WITH CONTRAST TECHNIQUE: Multidetector CT imaging of the abdomen and pelvis was performed using the standard protocol following bolus administration of intravenous contrast. RADIATION DOSE REDUCTION: This exam was performed according to the departmental dose-optimization program which includes automated exposure control, adjustment of the mA  and/or kV according to patient size and/or use of iterative reconstruction technique. CONTRAST:  75mL OMNIPAQUE IOHEXOL 350 MG/ML SOLN COMPARISON:  01/16/2024 FINDINGS: Lower chest:  No contributory findings. Hepatobiliary: No focal liver abnormality.No evidence of biliary obstruction or stone. Pancreas: Mild expansion and low-density to the pancreatic body and tail, significantly improved. No collection. Spleen: Unremarkable. Adrenals/Urinary Tract: Negative adrenals. No hydronephrosis or ureteral stone. 19 mm cyst at the lower pole left kidney. Punctate interpolar calculus on the left. Unremarkable bladder. Stomach/Bowel:  No obstruction. No visible bowel inflammation Vascular/Lymphatic: No acute vascular abnormality. Extensive in premature atheromatous plaque and luminal irregularity of the aorta and iliacs. No mass or adenopathy. Reproductive:No pathologic findings.  Small left hydrocele. Other: No ascites or pneumoperitoneum. Musculoskeletal: No acute abnormalities. IMPRESSION: Improved pancreatitis.  No collection. 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 Tiny left renal calculus. Electronically Signed   By: Tiburcio Pea M.D.   On: 01/25/2024 06:33   DG Chest 2 View Result Date: 01/25/2024 CLINICAL DATA:  Chest pain. Recent history of seizures and acute pancreatitis. Worsening abdominal pain. EXAM: CHEST - 2 VIEW COMPARISON:  08/23/2023 FINDINGS: Metallic structures projecting over the chest likely from clothing. Heart size and pulmonary vascularity are normal. Emphysematous changes and scarring in the lung apices similar to prior study. No airspace disease or consolidation in the lungs. No pleural effusion or pneumothorax. Mediastinal contours appear intact. IMPRESSION: No active cardiopulmonary disease. Electronically Signed   By: Burman Nieves M.D.   On: 01/25/2024 02:39    EKG: Independently reviewed.  Sinus rhythm.  No acute ST-T wave changes.  Comparable to EKG from  3/25.  Assessment/Plan Principal Problem:   Acute pancreatitis Active Problems:   Seizures (HCC)     1.  Epigastric and chest pain: Persistent pain due to pancreatitis. Admitted due to significant symptoms.  Adequate IV fluids.  IV and oral opiates.  Close monitoring of electrolytes and LFTs.  Monitoring of lipase. Will allow clears as he is not vomiting. CT scan and repeat CT scan without evidence of gallbladder disease.  Patient denies alcohol use.  Thought to be caused by Dilantin.  This was changed.  Triglyceride was 117.  2.  Chest pain: Likely atypical.  Mildly elevated troponin.  Troponins doubled in 6 hours.  Patient with CT scan evidence of  advanced atherosclerosis changes on the aorta and iliacs. Repeat EKGs are nonischemic. Will  check echocardiogram. If persistent pain, will discuss with cardiology for consultation.  3.  Seizure disorder: Recent breakthrough seizure and extensively investigated.  Depakote discontinued and patient currently on Vimpat.  Continue.  4.  Anxiety and depression: On sertraline and Seroquel.  Continue.  DVT prophylaxis: Lovenox subcu Code Status: Full code Family Communication: None at the bedside Disposition Plan: Home when stable Consults called: None Admission status: Medical telemetry monitor.   Dorcas Carrow MD [facility] Pager [phone]

## 2024-01-26 DIAGNOSIS — K8500 Idiopathic acute pancreatitis without necrosis or infection: Secondary | ICD-10-CM | POA: Diagnosis not present

## 2024-01-26 DIAGNOSIS — R569 Unspecified convulsions: Secondary | ICD-10-CM | POA: Diagnosis not present

## 2024-01-26 LAB — LIPASE, BLOOD: Lipase: 113 U/L — ABNORMAL HIGH (ref 11–51)

## 2024-01-26 LAB — COMPREHENSIVE METABOLIC PANEL WITH GFR
ALT: 20 U/L (ref 0–44)
AST: 20 U/L (ref 15–41)
Albumin: 3.3 g/dL — ABNORMAL LOW (ref 3.5–5.0)
Alkaline Phosphatase: 46 U/L (ref 38–126)
Anion gap: 8 (ref 5–15)
BUN: 7 mg/dL (ref 6–20)
CO2: 25 mmol/L (ref 22–32)
Calcium: 9.4 mg/dL (ref 8.9–10.3)
Chloride: 108 mmol/L (ref 98–111)
Creatinine, Ser: 0.91 mg/dL (ref 0.61–1.24)
GFR, Estimated: >60 mL/min (ref >60)
Glucose, Bld: 89 mg/dL (ref 70–99)
Potassium: 3.7 mmol/L (ref 3.5–5.1)
Sodium: 141 mmol/L (ref 135–145)
Total Bilirubin: 0.6 mg/dL (ref 0.0–1.2)
Total Protein: 6.5 g/dL (ref 6.5–8.1)

## 2024-01-26 LAB — CBC
HCT: 35.1 % — ABNORMAL LOW (ref 39.0–52.0)
Hemoglobin: 11.9 g/dL — ABNORMAL LOW (ref 13.0–17.0)
MCH: 31.8 pg (ref 26.0–34.0)
MCHC: 33.9 g/dL (ref 30.0–36.0)
MCV: 93.9 fL (ref 80.0–100.0)
Platelets: 294 10*3/uL (ref 150–400)
RBC: 3.74 MIL/uL — ABNORMAL LOW (ref 4.22–5.81)
RDW: 13.2 % (ref 11.5–15.5)
WBC: 6.5 10*3/uL (ref 4.0–10.5)
nRBC: 0 % (ref 0.0–0.2)

## 2024-01-26 LAB — MAGNESIUM: Magnesium: 1.9 mg/dL (ref 1.7–2.4)

## 2024-01-26 LAB — PHOSPHORUS: Phosphorus: 3.9 mg/dL (ref 2.5–4.6)

## 2024-01-26 MED ORDER — FAMOTIDINE 20 MG PO TABS: 20.0000 mg | ORAL_TABLET | Freq: Two times a day (BID) | ORAL | 0 refills | Status: DC

## 2024-01-26 NOTE — Plan of Care (Signed)
 Pt A/O, RA, VS stable. No pain. Ambulated with a cane in the hall. DC home orders are in. AVS papers provided, home meds returned, Education given. All questions are answered. Verbalizing understanding. Pt DC home with the family

## 2024-01-26 NOTE — Discharge Summary (Signed)
 Physician Discharge Summary  RODNEY YERA RUE:[id number] DOB: [birth year] DOA: 01/25/2024  PCP: Avanell Shackleton, NP-C  Admit date: 01/25/2024 Discharge date: 01/26/2024  Admitted From: Home Disposition: Home  Recommendations for Outpatient Follow-up:  Follow up with PCP in 1-2 weeks Cardiology will schedule follow-up  Home Health: N/A Equipment/Devices: N/A  Discharge Condition: Stable CODE STATUS: Full code Diet recommendation: Regular diet, soft diet and liquids.  Discharge summary: 38 y.o. male with medical history significant of anxiety, depression, seizure disorder who was recently admitted to the hospital with acute pancreatitis primary cause unknown and thought to be caused by Depakote.  He was treated symptomatically and discharged home on oral pain medications 3/26.  Patient continued to have epigastric pain and then he started having left-sided precordial chest pain so he came back to the hospital.  No other symptoms.   Patient continued to need IV pain medications so was observed in the hospital.   ED Course: Hemodynamically stable.  Electrolytes are adequate.  Lipase is 100.  Troponin was 25.  Repeat CT scan abdomen pelvis with improving pancreatic inflammation.  EKG and repeat EKG are without any evidence of ischemic changes.  Patient was given multiple rounds of pain medications and emergency room and admission requested.  Epigastric pain: Due to unresolved pancreatitis.  He was treated symptomatically with IV fluids and pain medications.  Today he is surprisingly asymptomatic and tolerating soft diet.  Not needing any pain medications since last night.  Patient was also started on Pepcid 20 mg twice daily that will be prescribed. Primary cause of pancreatitis was unknown.  There was no evidence of gallbladder stone.  Triglyceride was normal.  He was using THC before this admission but he is not using anymore.  Suggested symptomatic management and avoid all the drugs.  Chest  pain: Atypical.  He had mildly elevated troponins.  EKG nonischemic.  Echocardiogram with no evidence of ischemia.  CT scans with evidence of advanced atherosclerotic disease.  Also seen by cardiology.  They will schedule outpatient coronary CT scan.  Seizure disorder/anxiety and depression: Recently started on Vimpat that he will continue.  Patient is on Seroquel and Zoloft.   Symptoms improved.  Able to go home today.  Discharge Diagnoses:  Principal Problem:   Acute pancreatitis Active Problems:   Seizures (HCC)   Elevated troponin   Chest pain, atypical    Discharge Instructions  Discharge Instructions     Diet general   Complete by: As directed    Soft food   Increase activity slowly   Complete by: As directed       Allergies as of 01/26/2024       Reactions   Depakote [divalproex Sodium] Other (See Comments)   Pancreatitis 12/2023        Medication List     TAKE these medications    famotidine 20 MG tablet Commonly known as: PEPCID Take 1 tablet (20 mg total) by mouth 2 (two) times daily.   lacosamide 200 MG Tabs tablet Commonly known as: Vimpat Take 1 tablet (200 mg total) by mouth 2 (two) times daily.   ondansetron 4 MG disintegrating tablet Commonly known as: ZOFRAN-ODT Take 1 tablet (4 mg total) by mouth every 8 (eight) hours as needed for nausea or vomiting.   oxyCODONE-acetaminophen 5-325 MG tablet Commonly known as: PERCOCET/ROXICET Take 1 tablet by mouth every 6 (six) hours as needed for severe pain (pain score 7-10).   QUEtiapine 50 MG tablet Commonly known as: SEROquel Take  1 tablet (50 mg total) by mouth at bedtime.   sertraline 50 MG tablet Commonly known as: Zoloft Take 1 tablet (50 mg total) by mouth daily.        Follow-up Information     Corrin Parker, PA-C Follow up.   Specialty: Cardiology Why: Hospital follow-up with Cardiology is scheduled for 02/26/2024 at 10:55am. Please arrive 15 minutes early for check-in. If  this date/ time does not work for you, please call our office to reschedule.  We are currently in the process of transitioning from two locations to one.  Effective February 25, 2024, all appointments that were previously scheduled at either our [facility] or [place] locations will be moved to our new location at [address] [place], [place], [address].  The phone number for our new location will be [phone]. Contact information: [address] [place] [address] [place] [address] [phone]                Allergies  Allergen Reactions   Depakote [Divalproex Sodium] Other (See Comments)    Pancreatitis 12/2023    Consultations: Cardiology   Procedures/Studies: ECHOCARDIOGRAM COMPLETE Result Date: 01/25/2024    ECHOCARDIOGRAM REPORT   Patient Name:   KADRIAN PARTCH Date of Exam: 01/25/2024 Medical Rec #:  [id number]      Height:       66.0 in Accession #:    [id number]     Weight:       101.6 lb Date of Birth:  07-17-1986       BSA:          1.500 m Patient Age:    38 years       BP:           153/93 mmHg Patient Gender: M              HR:           55 bpm. Exam Location:  Inpatient Procedure: 2D Echo (Both Spectral and Color Flow Doppler were utilized during            procedure). Indications:    chest pain  History:        Patient has no prior history of Echocardiogram examinations.                 Seizures; Risk Factors:Current Smoker.  Sonographer:    Delcie Roch RDCS Referring Phys: [id number] Paiden Caraveo IMPRESSIONS  1. Left ventricular ejection fraction, by estimation, is 60 to 65%. The left ventricle has normal function. The left ventricle has no regional wall motion abnormalities. Left ventricular diastolic parameters were normal.  2. Right ventricular systolic function is normal. The right ventricular size is normal. Tricuspid regurgitation signal is inadequate for assessing PA pressure.  3. The mitral valve is normal in structure. No evidence of mitral valve regurgitation. No  evidence of mitral stenosis.  4. The aortic valve is grossly normal. Aortic valve regurgitation is not visualized. No aortic stenosis is present.  5. The inferior vena cava is normal in size with greater than 50% respiratory variability, suggesting right atrial pressure of 3 mmHg. Comparison(s): No prior Echocardiogram. FINDINGS  Left Ventricle: Left ventricular ejection fraction, by estimation, is 60 to 65%. The left ventricle has normal function. The left ventricle has no regional wall motion abnormalities. The left ventricular internal cavity size was normal in size. There is  no left ventricular hypertrophy. Left ventricular diastolic parameters were normal. Right Ventricle: The right ventricular size is  normal. No increase in right ventricular wall thickness. Right ventricular systolic function is normal. Tricuspid regurgitation signal is inadequate for assessing PA pressure. Left Atrium: Left atrial size was normal in size. Right Atrium: Right atrial size was normal in size. Pericardium: There is no evidence of pericardial effusion. Mitral Valve: The mitral valve is normal in structure. No evidence of mitral valve regurgitation. No evidence of mitral valve stenosis. Tricuspid Valve: The tricuspid valve is normal in structure. Tricuspid valve regurgitation is trivial. No evidence of tricuspid stenosis. Aortic Valve: The aortic valve is grossly normal. Aortic valve regurgitation is not visualized. No aortic stenosis is present. Pulmonic Valve: The pulmonic valve was normal in structure. Pulmonic valve regurgitation is not visualized. No evidence of pulmonic stenosis. Aorta: The aortic root and ascending aorta are structurally normal, with no evidence of dilitation. Venous: The inferior vena cava is normal in size with greater than 50% respiratory variability, suggesting right atrial pressure of 3 mmHg. IAS/Shunts: The atrial septum is grossly normal.  LEFT VENTRICLE PLAX 2D LVIDd:         4.10 cm   Diastology  LVIDs:         2.70 cm   LV e' medial:    10.10 cm/s LV PW:         0.90 cm   LV E/e' medial:  9.7 LV IVS:        0.80 cm   LV e' lateral:   12.40 cm/s LVOT diam:     2.00 cm   LV E/e' lateral: 7.9 LV SV:         55 LV SV Index:   37 LVOT Area:     3.14 cm  RIGHT VENTRICLE             IVC RV Basal diam:  2.40 cm     IVC diam: 1.70 cm RV S prime:     12.80 cm/s TAPSE (M-mode): 2.0 cm LEFT ATRIUM           Index        RIGHT ATRIUM           Index LA diam:      2.20 cm 1.47 cm/m   RA Area:     10.40 cm LA Vol (A4C): 25.7 ml 17.14 ml/m  RA Volume:   23.90 ml  15.94 ml/m  AORTIC VALVE LVOT Vmax:   89.30 cm/s LVOT Vmean:  49.300 cm/s LVOT VTI:    0.176 m  AORTA Ao Root diam: 2.70 cm Ao Asc diam:  2.50 cm MITRAL VALVE MV Area (PHT): 3.37 cm    SHUNTS MV Decel Time: 225 msec    Systemic VTI:  0.18 m MV E velocity: 97.70 cm/s  Systemic Diam: 2.00 cm MV A velocity: 77.20 cm/s MV E/A ratio:  1.27 Sunit Tolia Electronically signed by Tessa Lerner Signature Date/Time: 01/25/2024/4:43:20 PM    Final    CT ABDOMEN PELVIS W CONTRAST Result Date: 01/25/2024 CLINICAL DATA:  Pancreatitis, acute, severe EXAM: CT ABDOMEN AND PELVIS WITH CONTRAST TECHNIQUE: Multidetector CT imaging of the abdomen and pelvis was performed using the standard protocol following bolus administration of intravenous contrast. RADIATION DOSE REDUCTION: This exam was performed according to the departmental dose-optimization program which includes automated exposure control, adjustment of the mA and/or kV according to patient size and/or use of iterative reconstruction technique. CONTRAST:  75mL OMNIPAQUE IOHEXOL 350 MG/ML SOLN COMPARISON:  01/16/2024 FINDINGS: Lower chest:  No contributory findings. Hepatobiliary: No focal liver abnormality.No  evidence of biliary obstruction or stone. Pancreas: Mild expansion and low-density to the pancreatic body and tail, significantly improved. No collection. Spleen: Unremarkable. Adrenals/Urinary Tract: Negative  adrenals. No hydronephrosis or ureteral stone. 19 mm cyst at the lower pole left kidney. Punctate interpolar calculus on the left. Unremarkable bladder. Stomach/Bowel:  No obstruction. No visible bowel inflammation Vascular/Lymphatic: No acute vascular abnormality. Extensive in premature atheromatous plaque and luminal irregularity of the aorta and iliacs. No mass or adenopathy. Reproductive:No pathologic findings.  Small left hydrocele. Other: No ascites or pneumoperitoneum. Musculoskeletal: No acute abnormalities. IMPRESSION: Improved pancreatitis.  No collection. 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 Tiny left renal calculus. Electronically Signed   By: Tiburcio Pea M.D.   On: 01/25/2024 06:33   DG Chest 2 View Result Date: 01/25/2024 CLINICAL DATA:  Chest pain. Recent history of seizures and acute pancreatitis. Worsening abdominal pain. EXAM: CHEST - 2 VIEW COMPARISON:  08/23/2023 FINDINGS: Metallic structures projecting over the chest likely from clothing. Heart size and pulmonary vascularity are normal. Emphysematous changes and scarring in the lung apices similar to prior study. No airspace disease or consolidation in the lungs. No pleural effusion or pneumothorax. Mediastinal contours appear intact. IMPRESSION: No active cardiopulmonary disease. Electronically Signed   By: Burman Nieves M.D.   On: 01/25/2024 02:39   Overnight EEG with video Result Date: 01/20/2024 Charlsie Quest, MD     01/21/2024  9:27 AM Patient Name: CARLTON BUSKEY MRN: [id number] Epilepsy Attending: Charlsie Quest Referring Physician/Provider: Milon Dikes, MD Duration: 01/19/2024 2153 to 01/20/2024 1014 Patient history: 38 y.o. male with hx of anxiety, depression, hypertension, seizures-on Depakote, patient of Dr. Teresa Coombs at [facility], admitted for acute pancreatitis with nausea and vomiting, had back-to-back episodes of seizures while at [facility] where he was receiving  treatment for pancreatitis and was recommended to be transferred to [facility]. EEG to evaluate for seizure Level of alertness: Awake, asleep AEDs during EEG study: LEV Technical aspects: This EEG study was done with scalp electrodes positioned according to the 10-20 International system of electrode placement. Electrical activity was reviewed with band pass filter of 1-70Hz , sensitivity of 7 uV/mm, display speed of 49mm/sec with a 60Hz  notched filter applied as appropriate. EEG data were recorded continuously and digitally stored.  Video monitoring was available and reviewed as appropriate. Description: The posterior dominant rhythm consists of 8 Hz activity of moderate voltage (25-35 uV) seen predominantly in posterior head regions, symmetric and reactive to eye opening and eye closing. Sleep was characterized by vertex waves, sleep spindles (12 to 14 Hz), maximal frontocentral region. Frequent spikes were noted in left temporo-parietal region. Hyperventilation and photic stimulation were not performed.   ABNORMALITY -Spike, left temporo-parietal region IMPRESSION: This study is showed evidence of epileptogenicity arising from left temporo-parietal region. No seizures were seen throughout the recording. Charlsie Quest   MR BRAIN W WO CONTRAST Result Date: 01/18/2024 CLINICAL DATA:  38 year old male with abdominal pain, pancreatitis, vomiting, seizures. EXAM: MRI HEAD WITHOUT AND WITH CONTRAST TECHNIQUE: Multiplanar, multiecho pulse sequences of the brain and surrounding structures were obtained without and with intravenous contrast. CONTRAST:  5mL GADAVIST GADOBUTROL 1 MMOL/ML IV SOLN COMPARISON:  Head CT 10/23/2023. FINDINGS: Brain: Normal cerebral volume. No restricted diffusion to suggest acute infarction. No midline shift, mass effect, evidence of mass lesion, ventriculomegaly, extra-axial collection or acute intracranial hemorrhage. Cervicomedullary junction and pituitary are within normal  limits. Wallace Cullens and white matter signal is within normal  limits throughout the brain. No encephalomalacia or chronic cerebral blood products. On thin slice coronal images the hippocampal formations and mesial temporal lobes appear symmetric and normal. No abnormal enhancement identified. No dural thickening. Vascular: Major intracranial vascular flow voids are preserved. Following contrast the major dural venous sinuses are enhancing and appear to be patent. Skull and upper cervical spine: Visualized bone marrow signal is within normal limits. Normal visible cervical spine. Sinuses/Orbits: Orbits are negative. Paranasal sinuses remain well aerated. Other: Trace bilateral mastoid fluid. Other visible internal auditory structures appear normal. Negative visible scalp and face. IMPRESSION: Normal MRI appearance of the Brain. Electronically Signed   By: Odessa Fleming M.D.   On: 01/18/2024 04:01   CT ABDOMEN PELVIS W CONTRAST Result Date: 01/16/2024 CLINICAL DATA:  38 year old male with abdominal pain.  Pancreatitis. EXAM: CT ABDOMEN AND PELVIS WITH CONTRAST TECHNIQUE: Multidetector CT imaging of the abdomen and pelvis was performed using the standard protocol following bolus administration of intravenous contrast. RADIATION DOSE REDUCTION: This exam was performed according to the departmental dose-optimization program which includes automated exposure control, adjustment of the mA and/or kV according to patient size and/or use of iterative reconstruction technique. CONTRAST:  OMNIPAQUE IOHEXOL 300 MG/ML  SOLN COMPARISON:  Pelvis CT 12/11/2023. CT Abdomen and Pelvis 10/14/2023. FINDINGS: Lower chest: Lower lung volumes with dependent lung base atelectasis. No pericardial or pleural effusion. Hepatobiliary: New free fluid in the abdomen since December, simple fluid density. Mostly this is medial and inferior to the liver. Liver enhancement, gallbladder are within normal limits. Pancreas: Enlarged, inflamed,  heterogeneously enhancing throughout. Abundant edema in the lesser sac and abundant regional surrounding upper abdominal confluent edema or low-density ascites. No discrete pancreatic necrosis. No discrete mass or ductal dilatation. No organized fluid collection. Spleen: Adjacent free fluid but otherwise negative. Adrenals/Urinary Tract: Anterior pararenal space ascites or edema, but otherwise negative adrenal glands. Small benign appearing renal cysts, the largest in the left lower pole has simple fluid density. (No follow up imaging recommended). Punctate left midpole nephrolithiasis. No hydronephrosis. No evidence of hydroureter. No delayed excretory images. Stomach/Bowel: Nondilated large and small bowel throughout the abdomen and pelvis with superimposed ascites, most abundant in the upper abdomen but also layering in the left pericolic gutter (series 2, image 46). Retained stool in the transverse colon. No pneumoperitoneum identified. Cecum partially located in the pelvis. Appendix not well delineated. Retained fluid in the proximal stomach. Distal stomach and duodenum are mostly decompressed. Vascular/Lymphatic: Major arterial structures and the portal venous system including the splenic vein remain patent. Mild soft atherosclerotic plaque of the distal abdominal aorta suspected and stable. Superimposed iliac artery combined soft and calcified plaque. No lymphadenopathy identified. Reproductive: Negative. Other: Small volume of free fluid layering in the pelvis with simple fluid density on series 2, image 67. Musculoskeletal: Negative. IMPRESSION: 1. Acute Pancreatitis with generalized inflamed pancreas. Confluent edema and Ascites throughout the abdomen, with free fluid layering in the pelvis and paracolic gutters. No pancreatic necrosis, organized fluid collection, or other complicating features at this time. 2. No other acute or inflammatory process identified in the abdomen or pelvis. 3. Mild but advanced  for age Aortoiliac calcified atherosclerosis. Punctate nephrolithiasis. Electronically Signed   By: Odessa Fleming M.D.   On: 01/16/2024 05:18   (Echo, Carotid, EGD, Colonoscopy, ERCP)    Subjective: Patient seen and examined.  He used some pain medicine early morning for mild epigastric pain.  He feels hungry.  He was given trial of soft food and  tolerated well without recurrence of pain.  Eager to go home.  Denies any nausea vomiting.   Discharge Exam: Vitals:   01/26/24 1000 01/26/24 1200  BP:    Pulse:    Resp: 19 16  Temp:    SpO2:     Vitals:   01/26/24 0752 01/26/24 0800 01/26/24 1000 01/26/24 1200  BP: 135/87     Pulse: 64     Resp: 18 18 19 16   Temp:      TempSrc:      SpO2: 100%     Weight:      Height:        General: Pt is alert, awake, not in acute distress Cardiovascular: RRR, S1/S2 +, no rubs, no gallops Respiratory: CTA bilaterally, no wheezing, no rhonchi Abdominal: Soft, NT, ND, bowel sounds + Extremities: no edema, no cyanosis    The results of significant diagnostics from this hospitalization (including imaging, microbiology, ancillary and laboratory) are listed below for reference.     Microbiology: No results found for this or any previous visit (from the past 240 hours).   Labs: BNP (last 3 results) No results for input(s): "BNP" in the last 8760 hours. Basic Metabolic Panel: Recent Labs  Lab 01/20/24 1023 01/21/24 0750 01/22/24 0541 01/25/24 0221 01/26/24 0406  NA 142 141 139 141 141  K 3.6 3.8 4.0 3.6 3.7  CL 105 105 108 106 108  CO2 30 30 24 26 25   GLUCOSE 115* 138* 64* 105* 89  BUN 9 11 6 10 7   CREATININE 0.81 0.86 0.69 0.85 0.91  CALCIUM 8.6* 8.8* 8.2* 9.5 9.4  MG 1.6* 2.3 1.5*  --  1.9  PHOS 3.5 3.6 3.3  --  3.9   Liver Function Tests: Recent Labs  Lab 01/20/24 1023 01/21/24 0750 01/22/24 0541 01/25/24 0221 01/26/24 0406  AST 30 27 22 25 20   ALT 14 14 14 24 20   ALKPHOS 43 45 36* 53 46  BILITOT 0.5 0.4 0.4 0.6 0.6  PROT  5.1* 5.8* 4.6* 7.4 6.5  ALBUMIN 2.5* 2.9* 2.3* 3.9 3.3*   Recent Labs  Lab 01/20/24 1023 01/21/24 0750 01/22/24 0541 01/25/24 0225 01/26/24 0406  LIPASE 175* 181* 108* 101* 113*   No results for input(s): "AMMONIA" in the last 168 hours. CBC: Recent Labs  Lab 01/20/24 0656 01/21/24 0750 01/22/24 0541 01/25/24 0225 01/26/24 0406  WBC 6.2 6.0 5.8 9.6 6.5  NEUTROABS 2.8 3.4 3.7  --   --   HGB 10.3* 11.0* 10.4* 13.3 11.9*  HCT 30.0* 32.6* 30.5* 38.9* 35.1*  MCV 94.6 95.6 94.7 94.4 93.9  PLT 172 190 188 337 294   Cardiac Enzymes: No results for input(s): "CKTOTAL", "CKMB", "CKMBINDEX", "TROPONINI" in the last 168 hours. BNP: Invalid input(s): "POCBNP" CBG: Recent Labs  Lab 01/21/24 2159 01/25/24 0226  GLUCAP 88 105*   D-Dimer No results for input(s): "DDIMER" in the last 72 hours. Hgb A1c No results for input(s): "HGBA1C" in the last 72 hours. Lipid Profile No results for input(s): "CHOL", "HDL", "LDLCALC", "TRIG", "CHOLHDL", "LDLDIRECT" in the last 72 hours. Thyroid function studies No results for input(s): "TSH", "T4TOTAL", "T3FREE", "THYROIDAB" in the last 72 hours.  Invalid input(s): "FREET3" Anemia work up No results for input(s): "VITAMINB12", "FOLATE", "FERRITIN", "TIBC", "IRON", "RETICCTPCT" in the last 72 hours. Urinalysis    Component Value Date/Time   COLORURINE YELLOW 01/25/2024 0433   APPEARANCEUR CLEAR 01/25/2024 0433   LABSPEC 1.023 01/25/2024 0433   PHURINE 5.0 01/25/2024 0433  GLUCOSEU NEGATIVE 01/25/2024 0433   HGBUR NEGATIVE 01/25/2024 0433   BILIRUBINUR NEGATIVE 01/25/2024 0433   KETONESUR NEGATIVE 01/25/2024 0433   PROTEINUR NEGATIVE 01/25/2024 0433   UROBILINOGEN 0.2 09/17/2020 1031   NITRITE NEGATIVE 01/25/2024 0433   LEUKOCYTESUR NEGATIVE 01/25/2024 0433   Sepsis Labs Recent Labs  Lab 01/21/24 0750 01/22/24 0541 01/25/24 0225 01/26/24 0406  WBC 6.0 5.8 9.6 6.5   Microbiology No results found for this or any previous  visit (from the past 240 hours).   Time coordinating discharge: 35 minutes  SIGNED:   Dorcas Carrow, MD  [facility] 01/26/2024, 1:44 PM

## 2024-01-30 ENCOUNTER — Inpatient Hospital Stay: Admitting: Family Medicine

## 2024-01-31 ENCOUNTER — Ambulatory Visit: Attending: Internal Medicine

## 2024-02-08 ENCOUNTER — Ambulatory Visit

## 2024-02-11 ENCOUNTER — Ambulatory Visit (INDEPENDENT_AMBULATORY_CARE_PROVIDER_SITE_OTHER): Admitting: Family Medicine

## 2024-02-11 ENCOUNTER — Encounter: Payer: Self-pay | Admitting: Family Medicine

## 2024-02-11 VITALS — BP 132/84 | HR 85 | Temp 97.8°F | Ht 66.0 in | Wt 109.0 lb

## 2024-02-11 DIAGNOSIS — R569 Unspecified convulsions: Secondary | ICD-10-CM

## 2024-02-11 DIAGNOSIS — I700 Atherosclerosis of aorta: Secondary | ICD-10-CM | POA: Diagnosis not present

## 2024-02-11 DIAGNOSIS — R1084 Generalized abdominal pain: Secondary | ICD-10-CM

## 2024-02-11 DIAGNOSIS — D649 Anemia, unspecified: Secondary | ICD-10-CM | POA: Diagnosis not present

## 2024-02-11 DIAGNOSIS — F1220 Cannabis dependence, uncomplicated: Secondary | ICD-10-CM

## 2024-02-11 DIAGNOSIS — K859 Acute pancreatitis without necrosis or infection, unspecified: Secondary | ICD-10-CM | POA: Diagnosis not present

## 2024-02-11 DIAGNOSIS — F17200 Nicotine dependence, unspecified, uncomplicated: Secondary | ICD-10-CM

## 2024-02-11 DIAGNOSIS — J439 Emphysema, unspecified: Secondary | ICD-10-CM | POA: Diagnosis not present

## 2024-02-11 DIAGNOSIS — K625 Hemorrhage of anus and rectum: Secondary | ICD-10-CM

## 2024-02-11 LAB — BASIC METABOLIC PANEL WITH GFR
BUN: 16 mg/dL (ref 6–23)
CO2: 29 meq/L (ref 19–32)
Calcium: 9.4 mg/dL (ref 8.4–10.5)
Chloride: 103 meq/L (ref 96–112)
Creatinine, Ser: 0.79 mg/dL (ref 0.40–1.50)
GFR: 112.08 mL/min (ref >60.00)
Glucose, Bld: 124 mg/dL — ABNORMAL HIGH (ref 70–99)
Potassium: 4.3 meq/L (ref 3.5–5.1)
Sodium: 139 meq/L (ref 135–145)

## 2024-02-11 LAB — CBC
HCT: 36.7 % — ABNORMAL LOW (ref 39.0–52.0)
Hemoglobin: 12.3 g/dL — ABNORMAL LOW (ref 13.0–17.0)
MCHC: 33.5 g/dL (ref 30.0–36.0)
MCV: 96.6 fl (ref 78.0–100.0)
Platelets: 204 10*3/uL (ref 150.0–400.0)
RBC: 3.79 Mil/uL — ABNORMAL LOW (ref 4.22–5.81)
RDW: 13.5 % (ref 11.5–15.5)
WBC: 8.7 10*3/uL (ref 4.0–10.5)

## 2024-02-11 LAB — HEPATIC FUNCTION PANEL
ALT: 11 U/L (ref 0–53)
AST: 18 U/L (ref 0–37)
Albumin: 4.3 g/dL (ref 3.5–5.2)
Alkaline Phosphatase: 59 U/L (ref 39–117)
Bilirubin, Direct: 0.1 mg/dL (ref 0.0–0.3)
Total Bilirubin: 0.3 mg/dL (ref 0.2–1.2)
Total Protein: 7 g/dL (ref 6.0–8.3)

## 2024-02-11 LAB — LIPID PANEL
Cholesterol: 164 mg/dL (ref 0–200)
HDL: 42.3 mg/dL (ref >39.00)
LDL Cholesterol: 93 mg/dL (ref 0–99)
NonHDL: 121.75
Total CHOL/HDL Ratio: 4
Triglycerides: 144 mg/dL (ref 0.0–149.0)
VLDL: 28.8 mg/dL (ref 0.0–40.0)

## 2024-02-11 LAB — FERRITIN: Ferritin: 70 ng/mL (ref 22.0–322.0)

## 2024-02-11 LAB — MAGNESIUM: Magnesium: 1.9 mg/dL (ref 1.5–2.5)

## 2024-02-11 LAB — LIPASE: Lipase: 169 U/L — ABNORMAL HIGH (ref 11.0–59.0)

## 2024-02-11 NOTE — Patient Instructions (Signed)
 Please go downstairs for labs before you leave.  Eat a bland diet.  Please see the pancreatitis eating plan.  I also recommend that you cut back on foods high in saturated fat and cholesterol.  See the cholesterol eating plan.  Recommend you cut back on smoking tobacco and marijuana to improve your health.  Please see your specialists as scheduled.  Acute Pancreatitis  Acute pancreatitis happens when there is sudden swelling and irritation of the pancreas. The pancreas is a gland in your body that helps to control blood sugar. This gland also helps to digest food. This condition can last a few days and cause serious problems. Some problems can be life-threatening. The lungs, heart, and kidneys may stop working. What are the causes? Causes may include: Heavy alcohol use. Drug use. Gallstones. An abnormal growth of tissue (tumor) in the pancreas. Other causes include: Some medicines or some chemicals. Diabetes or infection. High levels of a type of fat in your blood. High levels of calcium in your blood. Damage caused by: An accident. The poison (venom) from a scorpion sting. Belly (abdominal) surgery. The body's defense system (immune system) attacking the pancreas (autoimmune pancreatitis). Genes that are passed from parent to child (inherited). Sometimes, the cause is not known. What are the signs or symptoms? Pain in the upper belly that may be felt in the back. The pain may be very bad. It often gets worse after you eat. A tender and swollen belly. Feeling like you may vomit (nausea) and vomiting. Fever. How is this treated? A stay in the hospital, in many cases. Pain medicine. Fluid through an IV tube. Placing a tube in the stomach to take out the stomach contents. This also helps you stop vomiting. Not eating until you vomit less. Antibiotic medicines, if you have an infection. Steroid medicines, if your problem is caused by attacks on your body's own tissues by your  defense system. Surgery, if your problem is caused by gallstones or other blockage. Treating other health problems that may be the cause. Follow these instructions at home: Medicines Take over-the-counter and prescription medicines only as told by your doctor. If you were prescribed an antibiotic medicine, take it as told by your doctor. Do not stop taking it even if you start to feel better. If told, take steps to prevent problems with pooping (constipation). You may need to: Take medicines. You will be told what medicines to take. Eat foods that are high in fiber. These include beans, whole grains, and fresh fruits and vegetables. Limit foods that are high in fat and sugar. These include fried or sweet foods. Ask your doctor if you should avoid driving or using machines while you are taking your medicine. Eating and drinking  Follow instructions from your doctor about what to eat and drink. You may need to: Avoid alcohol. Eat foods that do not have a lot of fat in them. Eat small meals often. Do not eat big meals. Drink enough fluid to keep your pee (urine) pale yellow. Do not drink alcohol if it caused your condition. General instructions Do not smoke or use any products that contain nicotine or tobacco. If you need help quitting, ask your doctor. Get plenty of rest. Check your blood sugar at home if your doctor tells you to. Keep all follow-up visits. Contact a doctor if: You do not get better as fast as expected. Your symptoms get worse. You have new symptoms. You have pain or weakness that lasts a long time.  You keep feeling like you may vomit. You get better and then pain comes back. You have a fever. Get help right away if: You vomit every time you eat or drink. Your pain gets very bad. Your skin or the white parts of your eyes turn yellow. You have sudden swelling in your belly. You feel dizzy or you faint. Your blood sugar is high (over 300 mg/dL). You vomit  blood. These symptoms may be an emergency. Do not wait to see if the symptoms will go away. Get help right away. Call 911. Summary Acute pancreatitis happens when there is sudden swelling and irritation of the pancreas. This condition is often caused by heavy alcohol use, drug use, or gallstones. You will likely have to stay in the hospital for treatment. This information is not intended to replace advice given to you by your health care provider. Make sure you discuss any questions you have with your health care provider. Document Revised: 09/06/2021 Document Reviewed: 09/06/2021 Elsevier Patient Education  2024 [employer].

## 2024-02-11 NOTE — Progress Notes (Signed)
 Subjective:     Patient ID: Maxwell Faulkner, male    DOB: [birth year], 38 y.o.   MRN: [id number]  Chief Complaint  Patient presents with   Medical Management of Chronic Issues    Need refill, wants to discuss medical marijuana card for stomach    HPI  History of Present Illness         Here to follow up on chronic health conditions. Since our last visit, he has been in the ED with acute pancreatitis and hospitalized on 2 separate occasions.   Last hospitalization was 01/25/2024-01/26/2024   Reports abdominal pain is present but not as severe. He is eating. States last night he had pasta, chicken and alfredo sauce. Denies vomiting. Using Zofran on 2 occasional for nausea. States marijuana helps with nausea.   He had troponin elevation during his hospitalization and advised to follow up with cardiology. Echocardiogram showed no evidence of ischemia.  CT scan showed evidence of advanced atherosclerotic disease.  He has an appt later this month.   No lipids on file.   Seizure disorder/anxiety and depression. Depakote was stopped during hospitalization. Started on lacosamide (Vimpat). He is also taking Seroquel and Zoloft.  Under the care of neurologist, Dr. Teresa Coombs, at [facility]. Upcoming appt this week.   He has a psychiatrist, Dr. Renaldo Fiddler, who prescribes his mental health medications.   States he smokes marijuana for his stress and anxiety along with his mental health medications.     Health Maintenance Due  Topic Date Due   COVID-19 Vaccine (1) Never done   Pneumococcal Vaccine 57-27 Years old (1 of 2 - PCV) Never done    Past Medical History:  Diagnosis Date   Anxiety    Depression    Hypertension    Seizures (HCC)     Past Surgical History:  Procedure Laterality Date   WISDOM TOOTH EXTRACTION      Family History  Problem Relation Age of Onset   Hypertension Mother     Social History   Socioeconomic History   Marital status: Significant Other    Spouse name:  sierra whitsett   Number of children: 3   Years of education: Not on file   Highest education level: Not on file  Occupational History   Not on file  Tobacco Use   Smoking status: Every Day    Current packs/day: 1.00    Types: Cigarettes   Smokeless tobacco: Never  Vaping Use   Vaping status: Never Used  Substance and Sexual Activity   Alcohol use: Not Currently    Comment: stopped drinking   Drug use: Not Currently    Types: Marijuana    Comment: last use 2 weeks ago   Sexual activity: Yes    Birth control/protection: None  Other Topics Concern   Not on file  Social History Narrative   Right handed   Caffeine 1 cup daily   Social Drivers of Health   Financial Resource Strain: Not on file  Food Insecurity: No Food Insecurity (01/25/2024)   Hunger Vital Sign    Worried About Running Out of Food in the Last Year: Never true    Ran Out of Food in the Last Year: Never true  Transportation Needs: No Transportation Needs (01/25/2024)   PRAPARE - Administrator, Civil Service (Medical): No    Lack of Transportation (Non-Medical): No  Physical Activity: Not on file  Stress: Not on file  Social Connections: Patient Declined (01/25/2024)  Social Advertising account executive [NHANES]    Frequency of Communication with Friends and Family: Patient declined    Frequency of Social Gatherings with Friends and Family: Patient declined    Attends Religious Services: Patient declined    Database administrator or Organizations: Patient declined    Attends Banker Meetings: Patient declined    Marital Status: Patient declined  Intimate Partner Violence: Not At Risk (01/25/2024)   Humiliation, Afraid, Rape, and Kick questionnaire    Fear of Current or Ex-Partner: No    Emotionally Abused: No    Physically Abused: No    Sexually Abused: No    Outpatient Medications Prior to Visit  Medication Sig Dispense Refill   famotidine (PEPCID) 20 MG tablet Take 1 tablet  (20 mg total) by mouth 2 (two) times daily. 60 tablet 0   lacosamide (VIMPAT) 200 MG TABS tablet Take 1 tablet (200 mg total) by mouth 2 (two) times daily. 60 tablet 0   ondansetron (ZOFRAN-ODT) 4 MG disintegrating tablet Take 1 tablet (4 mg total) by mouth every 8 (eight) hours as needed for nausea or vomiting. 20 tablet 0   QUEtiapine (SEROQUEL) 50 MG tablet Take 1 tablet (50 mg total) by mouth at bedtime. 90 tablet 0   sertraline (ZOLOFT) 50 MG tablet Take 1 tablet (50 mg total) by mouth daily. 30 tablet 2   oxyCODONE-acetaminophen (PERCOCET/ROXICET) 5-325 MG tablet Take 1 tablet by mouth every 6 (six) hours as needed for severe pain (pain score 7-10). (Patient not taking: Reported on 02/11/2024) 15 tablet 0   No facility-administered medications prior to visit.    Allergies  Allergen Reactions   Depakote [Divalproex Sodium] Other (See Comments)    Pancreatitis 12/2023    Review of Systems  Constitutional:  Negative for chills, fever and weight loss.  Eyes:  Negative for blurred vision and double vision.  Respiratory:  Negative for cough and shortness of breath.   Cardiovascular:  Negative for chest pain, palpitations and leg swelling.  Gastrointestinal:  Positive for abdominal pain, blood in stool and nausea. Negative for constipation, diarrhea and vomiting.  Genitourinary:  Negative for dysuria, frequency and urgency.  Neurological:  Negative for dizziness, focal weakness and headaches.  Psychiatric/Behavioral:  Positive for depression and substance abuse. Negative for suicidal ideas. The patient is nervous/anxious.        Objective:    Physical Exam Constitutional:      General: He is not in acute distress.    Appearance: He is not ill-appearing.  HENT:     Mouth/Throat:     Mouth: Mucous membranes are moist.  Eyes:     Extraocular Movements: Extraocular movements intact.     Conjunctiva/sclera: Conjunctivae normal.  Cardiovascular:     Rate and Rhythm: Normal rate and  regular rhythm.  Pulmonary:     Effort: Pulmonary effort is normal.     Breath sounds: Normal breath sounds.  Abdominal:     General: Abdomen is flat. Bowel sounds are decreased.     Palpations: Abdomen is soft.     Tenderness: There is generalized abdominal tenderness. There is no guarding or rebound. Negative signs include Murphy's sign and McBurney's sign.  Musculoskeletal:        General: Normal range of motion.     Cervical back: Normal range of motion and neck supple.     Right lower leg: No edema.     Left lower leg: No edema.  Skin:    General:  Skin is warm and dry.  Neurological:     General: No focal deficit present.     Mental Status: He is alert and oriented to person, place, and time.     Motor: No weakness.     Coordination: Coordination normal.     Gait: Gait normal.  Psychiatric:        Mood and Affect: Mood normal.        Behavior: Behavior normal.        Thought Content: Thought content normal.      BP 132/84 (BP Location: Left Arm, Patient Position: Sitting)   Pulse 85   Temp 97.8 F (36.6 C) (Temporal)   Ht 5\' 6"  (1.676 m)   Wt 109 lb (49.4 kg)   SpO2 100%   BMI 17.59 kg/m  Wt Readings from Last 3 Encounters:  02/11/24 109 lb (49.4 kg)  01/25/24 101 lb 9.6 oz (46.1 kg)  01/17/24 117 lb 1 oz (53.1 kg)       Assessment & Plan:   Problem List Items Addressed This Visit     Anemia   Relevant Orders   Hepatic function panel   Basic metabolic panel with GFR   CBC   Ferritin   Aortic atherosclerosis (HCC)   Relevant Orders   Lipid panel   BRBPR (bright red blood per rectum)   Generalized abdominal pain   Relevant Orders   Hepatic function panel   Lipase   Basic metabolic panel with GFR   CBC   Pancreatitis - Primary   Relevant Orders   Hepatic function panel   Lipase   Basic metabolic panel with GFR   Pulmonary emphysema (HCC)   Seizures (HCC)   Other Visit Diagnoses       Tobacco dependency         Cannabis dependence (HCC)          Hypomagnesemia       Relevant Orders   Basic metabolic panel with GFR   Magnesium      He is here for hospital discharge follow-up.  Multiple hospitalizations for acute pancreatitis. No acute distress today.  He is eating and has gained some weight.  Reviewed hospital discharge summary and recommendations.  Medications reconciled. He does not drink alcohol.  He does smoke tobacco and marijuana daily.  He is requesting medical marijuana "card".  We discussed that this is not legal in [place] .  Counseling on diet for pancreatitis.  Advanced atherosclerotic disease seen on CT scan.   We discussed this as well as potential health consequences.  Discussed smoking cessation, tobacco and marijuana. No lipids on file.  He is fasting.  Check lipids He has an appointment with cardiology later this month.  He will follow-up with his psychiatrist next week as scheduled.  He reports good compliance with Seroquel and Zoloft however, admits anxiety and depression are not well-controlled so he also smokes marijuana.  Encouraged him to discuss this with his psychiatrist. Follow-up pending lab results.  I have discontinued Ray L. Cichowski's oxyCODONE-acetaminophen. I am also having him maintain his QUEtiapine, sertraline, ondansetron, lacosamide, and famotidine.  No orders of the defined types were placed in this encounter.

## 2024-02-14 ENCOUNTER — Ambulatory Visit (INDEPENDENT_AMBULATORY_CARE_PROVIDER_SITE_OTHER): Payer: Medicaid Other | Admitting: Neurology

## 2024-02-14 ENCOUNTER — Encounter: Payer: Self-pay | Admitting: Neurology

## 2024-02-14 VITALS — BP 154/99 | HR 57 | Ht 66.0 in | Wt 109.0 lb

## 2024-02-14 DIAGNOSIS — Z5181 Encounter for therapeutic drug level monitoring: Secondary | ICD-10-CM | POA: Diagnosis not present

## 2024-02-14 DIAGNOSIS — G40009 Localization-related (focal) (partial) idiopathic epilepsy and epileptic syndromes with seizures of localized onset, not intractable, without status epilepticus: Secondary | ICD-10-CM

## 2024-02-14 DIAGNOSIS — R748 Abnormal levels of other serum enzymes: Secondary | ICD-10-CM | POA: Diagnosis not present

## 2024-02-14 MED ORDER — LACOSAMIDE 200 MG PO TABS: 200.0000 mg | ORAL_TABLET | Freq: Two times a day (BID) | ORAL | 3 refills | Status: DC

## 2024-02-14 MED ORDER — VALTOCO 15 MG DOSE 2 X 7.5 MG/0.1ML NA LQPK: 15.0000 mg | NASAL | 1 refills | Status: DC | PRN

## 2024-02-14 NOTE — Patient Instructions (Addendum)
 Continue with Lacosamide 200 mg twice daily  Will check Lacosamide level  Continue to follow up with Gastroenterologist to recheck Lipase.  If Lipase remains elevated, will discontinue Lacosamide as it might be associated with pancreatitis  Please call for any questions or concerns  Return in 6 months or sooner if worse
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
## 2024-02-17 NOTE — Progress Notes (Deleted)
 Cardiology Office Note:    Date:  02/17/2024   ID:  Maxwell Faulkner, DOB [birth year], MRN [id number]  PCP:  Abram Abraham, NP-C  Cardiologist:  None { Click to update primary MD,subspecialty MD or APP then REFRESH:1}    Referring MD: Abram Abraham, NP-C   Chief Complaint: hospital follow-up of atypical chest pain   History of Present Illness:    Maxwell Faulkner is a 38 y.o. male with a history of hypertension, anxiety/ depression, seizure disorder, and polysubstance abuse who is followed by Dr. Alda Amas and presents today for hospital follow-up of chest pain.   Patient was first seen by Cardiology during recent hospitalization. He was admitted from 01/17/2024 to 01/23/2024 for acute pancreatitis which was treated conservatively. Hospitalization was complicated by a breakthrough seizure. He was initially loaded with Keppra  but had recurrent seizure. Therefore, this was stopped and he was started on Vimpat . Urine drug screen was also positive for THC, opiates, and benzodiazepines. He was readmitted from 01/25/2024 to 01/26/2024 for epigastric pain and atypical chest pain. Chest pain was pleuritic in nature and also reproducible with palpation. EKG showed no acute ischemic changes. High-sensitivity troponin was minimally elevated and flat peaking at 59. Echo showed LVEF of 60-65% with normal wall motion and diastolic function, normal RV function, and no significant valvular disease. Chest pain was not felt to be cardiac and felt to likely be musculoskeletal in nature. Troponin elevation was felt to be secondary to demand ischemia in setting of pancreatitis.   Patient presents today for follow-up. ***  Atypical Chest Pain Patient was recently admitted with atypical chest pain in setting of acute pancreatitis. Echo showed normal LV function. Pain was pleuritic in nature and was reproducible with palpation. Felt to likely be musculoskeletal in nature.  - ***  Hypertension Patient has a history of  hypertension listed in his chart but not on any medications.  - ***   EKGs/Labs/Other Studies Reviewed:    The following studies were reviewed:  Echocardiogram 01/25/2024: Impressions: 1. Left ventricular ejection fraction, by estimation, is 60 to 65%. The  left ventricle has normal function. The left ventricle has no regional  wall motion abnormalities. Left ventricular diastolic parameters were  normal.   2. Right ventricular systolic function is normal. The right ventricular  size is normal. Tricuspid regurgitation signal is inadequate for assessing  PA pressure.   3. The mitral valve is normal in structure. No evidence of mitral valve  regurgitation. No evidence of mitral stenosis.   4. The aortic valve is grossly normal. Aortic valve regurgitation is not  visualized. No aortic stenosis is present.   5. The inferior vena cava is normal in size with greater than 50%  respiratory variability, suggesting right atrial pressure of 3 mmHg.    EKG:  EKG not ordered today.   Recent Labs: 02/11/2024: ALT 11; BUN 16; Creatinine, Ser 0.79; Hemoglobin 12.3; Magnesium  1.9; Platelets 204.0; Potassium 4.3; Sodium 139  Recent Lipid Panel    Component Value Date/Time   CHOL 164 02/11/2024 0901   CHOL 126 06/02/2022 0000   TRIG 144.0 02/11/2024 0901   HDL 42.30 02/11/2024 0901   HDL 44 06/02/2022 0000   CHOLHDL 4 02/11/2024 0901   VLDL 28.8 02/11/2024 0901   LDLCALC 93 02/11/2024 0901   LDLCALC 69 06/02/2022 0000    Physical Exam:    Vital Signs: There were no vitals taken for this visit.    Wt Readings from Last 3 Encounters:  02/14/24 109 lb (49.4 kg)  02/11/24 109 lb (49.4 kg)  01/25/24 101 lb 9.6 oz (46.1 kg)     General: 38 y.o. male in no acute distress. HEENT: Normocephalic and atraumatic. Sclera clear.  Neck: Supple. No carotid bruits. No JVD. Heart: *** RRR. Distinct S1 and S2. No murmurs, gallops, or rubs.  Lungs: No increased work of breathing. Clear to ausculation  bilaterally. No wheezes, rhonchi, or rales.  Abdomen: Soft, non-distended, and non-tender to palpation.  Extremities: No lower extremity edema.  Radial and distal pedal pulses 2+ and equal bilaterally. Skin: Warm and dry. Neuro: No focal deficits. Psych: Normal affect. Responds appropriately.   Assessment:    No diagnosis found.  Plan:     Disposition: Follow up in ***   Signed, Casimer Clear, PA-C  02/17/2024 5:10 PM    [facility]

## 2024-02-18 LAB — LACOSAMIDE: Lacosamide: 19.2 ug/mL — ABNORMAL HIGH (ref 5.0–10.0)

## 2024-02-18 NOTE — Progress Notes (Deleted)
 02/18/2024 Maxwell Faulkner [id number] 1986/05/31   CHIEF COMPLAINT:   HISTORY OF PRESENT ILLNESS: Maxwell Faulkner is a 38 year old male with a past medical history of anxiety, depression, hypertension, seizures,      Latest Ref Rng & Units 02/11/2024    9:01 AM 01/26/2024    4:06 AM 01/25/2024    2:25 AM  CBC  WBC 4.0 - 10.5 K/uL 8.7  6.5  9.6   Hemoglobin 13.0 - 17.0 g/dL 78.2  95.6  21.3   Hematocrit 39.0 - 52.0 % 36.7  35.1  38.9   Platelets 150.0 - 400.0 K/uL 204.0  294  337        Latest Ref Rng & Units 02/11/2024    9:01 AM 01/26/2024    4:06 AM 01/25/2024    2:21 AM  CMP  Glucose 70 - 99 mg/dL 086  89  578   BUN 6 - 23 mg/dL 16  7  10    Creatinine 0.40 - 1.50 mg/dL 4.69  6.29  5.28   Sodium 135 - 145 mEq/L 139  141  141   Potassium 3.5 - 5.1 mEq/L 4.3  3.7  3.6   Chloride 96 - 112 mEq/L 103  108  106   CO2 19 - 32 mEq/L 29  25  26    Calcium 8.4 - 10.5 mg/dL 9.4  9.4  9.5   Total Protein 6.0 - 8.3 g/dL 7.0  6.5  7.4   Total Bilirubin 0.2 - 1.2 mg/dL 0.3  0.6  0.6   Alkaline Phos 39 - 117 U/L 59  46  53   AST 0 - 37 U/L 18  20  25    ALT 0 - 53 U/L 11  20  24      CTAP with contrast 01/05/2024: FINDINGS: Lower chest:  No contributory findings.   Hepatobiliary: No focal liver abnormality.No evidence of biliary obstruction or stone.   Pancreas: Mild expansion and low-density to the pancreatic body and tail, significantly improved. No collection.   Spleen: Unremarkable.   Adrenals/Urinary Tract: Negative adrenals. No hydronephrosis or ureteral stone. 19 mm cyst at the lower pole left kidney. Punctate interpolar calculus on the left. Unremarkable bladder.   Stomach/Bowel:  No obstruction. No visible bowel inflammation   Vascular/Lymphatic: No acute vascular abnormality. Extensive in premature atheromatous plaque and luminal irregularity of the aorta and iliacs. No mass or adenopathy.   Reproductive:No pathologic findings.  Small left hydrocele.   Other: No  ascites or pneumoperitoneum.   Musculoskeletal: No acute abnormalities.   IMPRESSION: Improved pancreatitis.  No collection.   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   Tiny left renal calculus.  CTAP with contrast 01/16/2024: FINDINGS: Lower chest: Lower lung volumes with dependent lung base atelectasis. No pericardial or pleural effusion.   Hepatobiliary: New free fluid in the abdomen since December, simple fluid density. Mostly this is medial and inferior to the liver. Liver enhancement, gallbladder are within normal limits.   Pancreas: Enlarged, inflamed, heterogeneously enhancing throughout. Abundant edema in the lesser sac and abundant regional surrounding upper abdominal confluent edema or low-density ascites. No discrete pancreatic necrosis. No discrete mass or ductal dilatation. No organized fluid collection.   Spleen: Adjacent free fluid but otherwise negative.   Adrenals/Urinary Tract: Anterior pararenal space ascites or edema, but otherwise negative adrenal glands. Small benign appearing renal cysts, the largest in the left lower pole has simple fluid density. (No follow up imaging recommended). Punctate  left midpole nephrolithiasis. No hydronephrosis. No evidence of hydroureter. No delayed excretory images.   Stomach/Bowel: Nondilated large and small bowel throughout the abdomen and pelvis with superimposed ascites, most abundant in the upper abdomen but also layering in the left pericolic gutter (series 2, image 46). Retained stool in the transverse colon. No pneumoperitoneum identified. Cecum partially located in the pelvis. Appendix not well delineated.   Retained fluid in the proximal stomach. Distal stomach and duodenum are mostly decompressed.   Vascular/Lymphatic: Major arterial structures and the portal venous system including the splenic vein remain patent. Mild soft atherosclerotic plaque of the distal abdominal aorta suspected  and stable. Superimposed iliac artery combined soft and calcified plaque. No lymphadenopathy identified.   Reproductive: Negative.   Other: Small volume of free fluid layering in the pelvis with simple fluid density on series 2, image 67.   Musculoskeletal: Negative.   IMPRESSION: 1. Acute Pancreatitis with generalized inflamed pancreas. Confluent edema and Ascites throughout the abdomen, with free fluid layering in the pelvis and paracolic gutters. No pancreatic necrosis, organized fluid collection, or other complicating features at this time.   2. No other acute or inflammatory process identified in the abdomen or pelvis.   3. Mild but advanced for age Aortoiliac calcified atherosclerosis. Punctate nephrolithiasis.      Past Medical History:  Diagnosis Date   Anxiety    Depression    Hypertension    Seizures (HCC)    Past Surgical History:  Procedure Laterality Date   WISDOM TOOTH EXTRACTION     Social History:  reports that he has been smoking cigarettes. He has never used smokeless tobacco. He reports that he does not currently use alcohol. He reports that he does not currently use drugs after having used the following drugs: Marijuana.  Family History: family history includes Hypertension in his mother; Migraines in his mother.  Allergies  Allergen Reactions   Depakote  [Divalproex  Sodium] Other (See Comments)    Pancreatitis 12/2023      Outpatient Encounter Medications as of 02/19/2024  Medication Sig   diazePAM , 15 MG Dose, (VALTOCO  15 MG DOSE) 2 x 7.5 MG/0.1ML LQPK Place 15 mg into the nose as needed (For seizure lasting more than 2 minutes).   famotidine  (PEPCID ) 20 MG tablet Take 1 tablet (20 mg total) by mouth 2 (two) times daily.   lacosamide  (VIMPAT ) 200 MG TABS tablet Take 1 tablet (200 mg total) by mouth 2 (two) times daily.   ondansetron  (ZOFRAN -ODT) 4 MG disintegrating tablet Take 1 tablet (4 mg total) by mouth every 8 (eight) hours as needed for  nausea or vomiting.   QUEtiapine  (SEROQUEL ) 50 MG tablet Take 1 tablet (50 mg total) by mouth at bedtime.   sertraline  (ZOLOFT ) 50 MG tablet Take 1 tablet (50 mg total) by mouth daily.   No facility-administered encounter medications on file as of 02/19/2024.     REVIEW OF SYSTEMS:  Gen: Denies fever, sweats or chills. No weight loss.  CV: Denies chest pain, palpitations or edema. Resp: Denies cough, shortness of breath of hemoptysis.  GI: Denies heartburn, dysphagia, stomach or lower abdominal pain. No diarrhea or constipation.  GU: Denies urinary burning, blood in urine, increased urinary frequency or incontinence. MS: Denies joint pain, muscles aches or weakness. Derm: Denies rash, itchiness, skin lesions or unhealing ulcers. Psych: Denies depression, anxiety, memory loss or confusion. Heme: Denies bruising, easy bleeding. Neuro:  Denies headaches, dizziness or paresthesias. Endo:  Denies any problems with DM, thyroid or adrenal function.  PHYSICAL EXAM: There were no vitals taken for this visit. General: in no acute distress. Head: Normocephalic and atraumatic. Eyes:  Sclerae non-icteric, conjunctive pink. Ears: Normal auditory acuity. Mouth: Dentition intact. No ulcers or lesions.  Neck: Supple, no lymphadenopathy or thyromegaly.  Lungs: Clear bilaterally to auscultation without wheezes, crackles or rhonchi. Heart: Regular rate and rhythm. No murmur, rub or gallop appreciated.  Abdomen: Soft, nontender, nondistended. No masses. No hepatosplenomegaly. Normoactive bowel sounds x 4 quadrants.  Rectal: Deferred.  Musculoskeletal: Symmetrical with no gross deformities. Skin: Warm and dry. No rash or lesions on visible extremities. Extremities: No edema. Neurological: Alert oriented x 4, no focal deficits.  Psychological:  Alert and cooperative. Normal mood and affect.  ASSESSMENT AND PLAN:    CC:  Henson, [name], NP-C

## 2024-02-19 ENCOUNTER — Ambulatory Visit: Admitting: Nurse Practitioner

## 2024-02-20 ENCOUNTER — Telehealth: Payer: Self-pay

## 2024-02-20 ENCOUNTER — Other Ambulatory Visit: Payer: Self-pay | Admitting: Neurology

## 2024-02-20 ENCOUNTER — Encounter: Payer: Self-pay | Admitting: Neurology

## 2024-02-20 DIAGNOSIS — R748 Abnormal levels of other serum enzymes: Secondary | ICD-10-CM

## 2024-02-20 NOTE — Telephone Encounter (Signed)
 Call to patient, he is in agreement to come by office for lab works an dwill reschedule GI appt.

## 2024-02-22 ENCOUNTER — Encounter (HOSPITAL_COMMUNITY): Admitting: Student in an Organized Health Care Education/Training Program

## 2024-02-22 ENCOUNTER — Encounter (HOSPITAL_COMMUNITY): Payer: Self-pay

## 2024-02-22 NOTE — Progress Notes (Deleted)
 [facility] MD/PA/NP OP Progress Note  02/22/2024 7:11 AM AZAAN LEASK  MRN:  [id number]  Chief Complaint:  No chief complaint on file.  HPI:  Connie Hilgert. Kina is a 38 yr old male who presents for Follow Up and Medication Management.  PPHx is significant for Depression and Anxiety, and no Suicide Attempts, Self Injurious Behavior, or Hospitalizations.   He reports ***   Visit Diagnosis:  No diagnosis found.      Past Psychiatric History: Depression and Anxiety, and no Suicide Attempts, Self Injurious Behavior, or Hospitalizations.  Past Medical History:  Past Medical History:  Diagnosis Date   Anxiety    Depression    Hypertension    Seizures (HCC)     Past Surgical History:  Procedure Laterality Date   WISDOM TOOTH EXTRACTION      Family Psychiatric History: No Known Diagnosis', Substance Abuse, or Suicides.   Family History:  Family History  Problem Relation Age of Onset   Migraines Mother    Hypertension Mother     Social History:  Social History   Socioeconomic History   Marital status: Significant Other    Spouse name: sierra whitsett   Number of children: 3   Years of education: Not on file   Highest education level: Not on file  Occupational History   Not on file  Tobacco Use   Smoking status: Every Day    Current packs/day: 1.00    Types: Cigarettes   Smokeless tobacco: Never  Vaping Use   Vaping status: Never Used  Substance and Sexual Activity   Alcohol use: Not Currently    Comment: stopped drinking   Drug use: Not Currently    Types: Marijuana    Comment: last use 2 weeks ago   Sexual activity: Yes    Birth control/protection: None  Other Topics Concern   Not on file  Social History Narrative   Right handed   Caffeine 1 cup daily   Social Drivers of Health   Financial Resource Strain: Not on file  Food Insecurity: No Food Insecurity (01/25/2024)   Hunger Vital Sign    Worried About Running Out of Food in the Last Year: Never true     Ran Out of Food in the Last Year: Never true  Transportation Needs: No Transportation Needs (01/25/2024)   PRAPARE - Administrator, Civil Service (Medical): No    Lack of Transportation (Non-Medical): No  Physical Activity: Not on file  Stress: Not on file  Social Connections: Patient Declined (01/25/2024)   Social Connection and Isolation Panel [NHANES]    Frequency of Communication with Friends and Family: Patient declined    Frequency of Social Gatherings with Friends and Family: Patient declined    Attends Religious Services: Patient declined    Database administrator or Organizations: Patient declined    Attends Banker Meetings: Patient declined    Marital Status: Patient declined    Allergies:  Allergies  Allergen Reactions   Depakote  [Divalproex  Sodium] Other (See Comments)    Pancreatitis 12/2023    Metabolic Disorder Labs: No results found for: "HGBA1C", "MPG" No results found for: "PROLACTIN" Lab Results  Component Value Date   CHOL 164 02/11/2024   TRIG 144.0 02/11/2024   HDL 42.30 02/11/2024   CHOLHDL 4 02/11/2024   VLDL 28.8 02/11/2024   LDLCALC 93 02/11/2024   LDLCALC 81 01/17/2024   No results found for: "TSH"  Therapeutic Level Labs: No results found for: "  LITHIUM" Lab Results  Component Value Date   VALPROATE 58 01/18/2024   VALPROATE 65 01/16/2024   No results found for: "CBMZ"  Current Medications: Current Outpatient Medications  Medication Sig Dispense Refill   diazePAM , 15 MG Dose, (VALTOCO  15 MG DOSE) 2 x 7.5 MG/0.1ML LQPK Place 15 mg into the nose as needed (For seizure lasting more than 2 minutes). 5 each 1   famotidine  (PEPCID ) 20 MG tablet Take 1 tablet (20 mg total) by mouth 2 (two) times daily. 60 tablet 0   lacosamide  (VIMPAT ) 200 MG TABS tablet Take 1 tablet (200 mg total) by mouth 2 (two) times daily. 180 tablet 3   ondansetron  (ZOFRAN -ODT) 4 MG disintegrating tablet Take 1 tablet (4 mg total) by mouth every 8  (eight) hours as needed for nausea or vomiting. 20 tablet 0   QUEtiapine  (SEROQUEL ) 50 MG tablet Take 1 tablet (50 mg total) by mouth at bedtime. 90 tablet 0   sertraline  (ZOLOFT ) 50 MG tablet Take 1 tablet (50 mg total) by mouth daily. 30 tablet 2   No current facility-administered medications for this visit.     Musculoskeletal: Strength & Muscle Tone: within normal limits Gait & Station: normal Patient leans: N/A *** Psychiatric Specialty Exam: Review of Systems  There were no vitals taken for this visit.There is no height or weight on file to calculate BMI.  General Appearance: Casual and Fairly Groomed  Eye Contact:  Good  Speech:  Clear and Coherent and Normal Rate  Volume:  Normal  Mood:  Dysphoric  Affect:  Congruent  Thought Process:  Coherent and Goal Directed  Orientation:  Full (Time, Place, and Person)  Thought Content: WDL and Logical   Suicidal Thoughts:  No  Homicidal Thoughts:  No  Memory:  Immediate;   Good Recent;   Good  Judgement:  Good  Insight:  Good  Psychomotor Activity:  Normal  Concentration:  Concentration: Good and Attention Span: Good  Recall:  Good  Fund of Knowledge: Good  Language: Good  Akathisia:  Negative  Handed:  Right  AIMS (if indicated): not done  Assets:  Communication Skills Desire for Improvement Housing Physical Health Resilience Social Support  ADL's:  Intact  Cognition: WNL  Sleep:  Good   Screenings: GAD-7    Flowsheet Row Office Visit from 02/14/2024 in [facility] Office Visit from 06/02/2022 in [facility]  Total GAD-7 Score 13 18      PHQ2-9    Flowsheet Row Office Visit from 02/14/2024 in [facility] Office Visit from 01/11/2024 in [facility] Office Visit from 06/02/2022 in [facility]  PHQ-2 Total Score 3 5 6   PHQ-9 Total Score 12 9 23       Flowsheet  Row ED to Hosp-Admission (Discharged) from 01/25/2024 in [facility] ED to Hosp-Admission (Discharged) from 01/17/2024 in [facility] from 01/16/2024 in [facility] Emergency Department at [facility]  C-SSRS RISK CATEGORY No Risk No Risk No Risk        Assessment and Plan:  Garett L. Mcclinton is a 38 yr old male who presents for Follow Up and Medication Management.  PPHx is significant for Depression and Anxiety, and no Suicide Attempts, Self Injurious Behavior, or Hospitalizations.   [place] continues ***   MDD, Recurrant, Moderate  GAD: -Continue Zoloft  to 50 mg daily.  30 tablets with  2 refills. -Continue Seroquel  50 mg QHS for Augmentation, sleep, and appetite.  90 tablets with 0 refills.     Insomnia: -Continue Seroquel  50 mg QHS for Augmentation, sleep, and appetite.  90 tablets with 0 refills.   Seizures: -Depakote  1000 mg BID prescribed by Neurology   Collaboration of Care:   Patient/Guardian was advised Release of Information must be obtained prior to any record release in order to collaborate their care with an outside provider. Patient/Guardian was advised if they have not already done so to contact the registration department to sign all necessary forms in order for us  to release information regarding their care.   Consent: Patient/Guardian gives verbal consent for treatment and assignment of benefits for services provided during this visit. Patient/Guardian expressed understanding and agreed to proceed.    Basilia Bosworth, MD 02/22/2024, 7:11 AM

## 2024-02-23 NOTE — Progress Notes (Signed)
 This encounter was created in error - please disregard.

## 2024-02-26 ENCOUNTER — Telehealth (HOSPITAL_COMMUNITY): Payer: Self-pay

## 2024-02-26 ENCOUNTER — Ambulatory Visit: Attending: Student | Admitting: Student

## 2024-02-26 DIAGNOSIS — F411 Generalized anxiety disorder: Secondary | ICD-10-CM

## 2024-02-26 DIAGNOSIS — F321 Major depressive disorder, single episode, moderate: Secondary | ICD-10-CM

## 2024-02-26 DIAGNOSIS — G4700 Insomnia, unspecified: Secondary | ICD-10-CM

## 2024-02-26 NOTE — Telephone Encounter (Signed)
 Pt/ Pharmacy is requesting for a refill of   QUEtiapine  (SEROQUEL ) 50 MG tablets.  Pt last seen 12/21/2023  Last refill was 11/15/2023

## 2024-02-28 MED ORDER — QUETIAPINE FUMARATE 50 MG PO TABS: 50.0000 mg | ORAL_TABLET | Freq: Every day | ORAL | 0 refills | Status: DC

## 2024-02-28 MED ORDER — SERTRALINE HCL 50 MG PO TABS
50.0000 mg | ORAL_TABLET | Freq: Every day | ORAL | 2 refills | Status: DC
Start: 2024-02-28 — End: 2024-03-21

## 2024-02-28 NOTE — Telephone Encounter (Signed)
 Received request for refill of patient's Seroquel  from his pharmacy.  We will send in a refill and ask staff to schedule follow-up appointment.   Sent: -Continue Zoloft  to 50 mg daily.  30 tablets with 2 refills. -Continue Seroquel  50 mg QHS for Augmentation, sleep, and appetite.  90 tablets with 0 refills.

## 2024-02-28 NOTE — Addendum Note (Signed)
 Addended by: Azaliah Carrero S on: 02/28/2024 07:25 AM   Modules accepted: Orders

## 2024-03-03 ENCOUNTER — Telehealth (HOSPITAL_COMMUNITY): Payer: Self-pay

## 2024-03-03 NOTE — Telephone Encounter (Signed)
 Medication management - Call from patient to report he is in need of a new Quetiapine  order. Informed patient Dr. Jann Melody had sent in these for him on 02/28/24 and informed patient he would need to make his next appointment. Agreed to call pt back with an appointment time and pt agreed with plan.

## 2024-03-03 NOTE — Telephone Encounter (Signed)
 Appointment - Called patient back to inform of appointment set now for 03/21/24 at 11L00 am at the [facility] outpatient and patient agreed to this new appointment time with Dr. Jann Melody.

## 2024-03-21 ENCOUNTER — Ambulatory Visit (INDEPENDENT_AMBULATORY_CARE_PROVIDER_SITE_OTHER): Admitting: Student in an Organized Health Care Education/Training Program

## 2024-03-21 ENCOUNTER — Encounter (HOSPITAL_COMMUNITY): Payer: Self-pay | Admitting: Student in an Organized Health Care Education/Training Program

## 2024-03-21 VITALS — BP 144/99 | HR 77 | Ht 66.0 in | Wt 106.6 lb

## 2024-03-21 DIAGNOSIS — F332 Major depressive disorder, recurrent severe without psychotic features: Secondary | ICD-10-CM | POA: Diagnosis not present

## 2024-03-21 DIAGNOSIS — F411 Generalized anxiety disorder: Secondary | ICD-10-CM | POA: Diagnosis not present

## 2024-03-21 DIAGNOSIS — G4700 Insomnia, unspecified: Secondary | ICD-10-CM | POA: Diagnosis not present

## 2024-03-21 MED ORDER — QUETIAPINE FUMARATE 50 MG PO TABS: 50.0000 mg | ORAL_TABLET | Freq: Every day | ORAL | 0 refills | Status: AC

## 2024-03-21 MED ORDER — SERTRALINE HCL 100 MG PO TABS: 100.0000 mg | ORAL_TABLET | Freq: Every day | ORAL | 2 refills | Status: AC

## 2024-03-21 MED ORDER — BUSPIRONE HCL 10 MG PO TABS: 10.0000 mg | ORAL_TABLET | Freq: Two times a day (BID) | ORAL | 2 refills | Status: AC

## 2024-03-21 NOTE — Progress Notes (Signed)
 [facility] MD/PA/NP OP Progress Note  03/21/2024 11:34 AM Maxwell Faulkner  MRN:  [id number]  Chief Complaint:  Chief Complaint  Patient presents with   Follow-up   Depression   Anxiety   HPI:  Maxwell Faulkner is a 38 yr old male who presents for Follow Up and Medication Management.  PPHx is significant for Depression and Anxiety, and no Suicide Attempts, Self Injurious Behavior, or Hospitalizations.   He reports he has had significant worsening of his depression and anxiety since his last appointment.  He reports that he had a string of 5 seizures and was hospitalized.  He reports not remembering most of the hospitalization.  He reports he has just been tired since then.  Reports ongoing stress due to his financial situation.  He reports because of the seizures and not being able to work he is unable to help pay the bills for staying at his mother's.  He reports there have been times where when he was going to sleep he hoped he would not wake up.  He reports he is never had any thoughts of doing anything to make that happen because when those thoughts happen he begins to think of his children.  Discussed with him that we would approach this in multiple ways.  Discussed first increasing his Zoloft  as he was still at 50 mg.  Also discussed starting BuSpar to address his anxiety but also help further with his depression.  Discussed starting therapy and he was agreeable with this.  Discussed with him what to do in the event of a future crisis.  Discussed that he can go to [facility], go to the nearest ED, or call [phone] or [phone].   He reported understanding.  He reports no SI, HI, or AVH.  He reports sleep is been fair.  He reports his appetite has been fair.  He reports no side effects to his medications.  He reports no other concerns at present.  Discussed with him that he would be seen by another provider next time and he reported understanding.  He will return for follow-up in approximately 1 to 2 months with Dr.  Chien.  Discussed with him that his blood pressure was elevated today and encouraged him to follow up with his PCP about this.  He reports he will do so.    Visit Diagnosis:    ICD-10-CM   1. Severe episode of recurrent major depressive disorder, without psychotic features (HCC)  F33.2 sertraline  (ZOLOFT ) 100 MG tablet    QUEtiapine  (SEROQUEL ) 50 MG tablet    busPIRone (BUSPAR) 10 MG tablet    2. GAD (generalized anxiety disorder)  F41.1 sertraline  (ZOLOFT ) 100 MG tablet    QUEtiapine  (SEROQUEL ) 50 MG tablet    busPIRone (BUSPAR) 10 MG tablet    3. Insomnia, unspecified type  G47.00 QUEtiapine  (SEROQUEL ) 50 MG tablet          Past Psychiatric History: Depression and Anxiety, and no Suicide Attempts, Self Injurious Behavior, or Hospitalizations.  Past Medical History:  Past Medical History:  Diagnosis Date   Anxiety    Depression    Hypertension    Seizures (HCC)     Past Surgical History:  Procedure Laterality Date   WISDOM TOOTH EXTRACTION      Family Psychiatric History: No Known Diagnosis', Substance Abuse, or Suicides.   Family History:  Family History  Problem Relation Age of Onset   Migraines Mother    Hypertension Mother     Social History:  Social History   Socioeconomic History   Marital status: Significant Other    Spouse name: sierra whitsett   Number of children: 3   Years of education: Not on file   Highest education level: Not on file  Occupational History   Not on file  Tobacco Use   Smoking status: Every Day    Current packs/day: 1.00    Types: Cigarettes   Smokeless tobacco: Never  Vaping Use   Vaping status: Never Used  Substance and Sexual Activity   Alcohol use: Not Currently    Comment: stopped drinking   Drug use: Not Currently    Types: Marijuana    Comment: last use 2 weeks ago   Sexual activity: Yes    Birth control/protection: None  Other Topics Concern   Not on file  Social History Narrative   Right handed    Caffeine 1 cup daily   Social Drivers of Health   Financial Resource Strain: Not on file  Food Insecurity: No Food Insecurity (01/25/2024)   Hunger Vital Sign    Worried About Running Out of Food in the Last Year: Never true    Ran Out of Food in the Last Year: Never true  Transportation Needs: No Transportation Needs (01/25/2024)   PRAPARE - Administrator, Civil Service (Medical): No    Lack of Transportation (Non-Medical): No  Physical Activity: Not on file  Stress: Not on file  Social Connections: Patient Declined (01/25/2024)   Social Connection and Isolation Panel [NHANES]    Frequency of Communication with Friends and Family: Patient declined    Frequency of Social Gatherings with Friends and Family: Patient declined    Attends Religious Services: Patient declined    Database administrator or Organizations: Patient declined    Attends Banker Meetings: Patient declined    Marital Status: Patient declined    Allergies:  Allergies  Allergen Reactions   Depakote  [Divalproex  Sodium] Other (See Comments)    Pancreatitis 12/2023    Metabolic Disorder Labs: No results found for: "HGBA1C", "MPG" No results found for: "PROLACTIN" Lab Results  Component Value Date   CHOL 164 02/11/2024   TRIG 144.0 02/11/2024   HDL 42.30 02/11/2024   CHOLHDL 4 02/11/2024   VLDL 28.8 02/11/2024   LDLCALC 93 02/11/2024   LDLCALC 81 01/17/2024   No results found for: "TSH"  Therapeutic Level Labs: No results found for: "LITHIUM" Lab Results  Component Value Date   VALPROATE 58 01/18/2024   VALPROATE 65 01/16/2024   No results found for: "CBMZ"  Current Medications: Current Outpatient Medications  Medication Sig Dispense Refill   busPIRone (BUSPAR) 10 MG tablet Take 1 tablet (10 mg total) by mouth 2 (two) times daily. 60 tablet 2   diazePAM , 15 MG Dose, (VALTOCO  15 MG DOSE) 2 x 7.5 MG/0.1ML LQPK Place 15 mg into the nose as needed (For seizure lasting more than  2 minutes). 5 each 1   famotidine  (PEPCID ) 20 MG tablet Take 1 tablet (20 mg total) by mouth 2 (two) times daily. 60 tablet 0   lacosamide  (VIMPAT ) 200 MG TABS tablet Take 1 tablet (200 mg total) by mouth 2 (two) times daily. 180 tablet 3   ondansetron  (ZOFRAN -ODT) 4 MG disintegrating tablet Take 1 tablet (4 mg total) by mouth every 8 (eight) hours as needed for nausea or vomiting. 20 tablet 0   QUEtiapine  (SEROQUEL ) 50 MG tablet Take 1 tablet (50 mg total) by mouth at bedtime.  90 tablet 0   sertraline  (ZOLOFT ) 100 MG tablet Take 1 tablet (100 mg total) by mouth daily. 30 tablet 2   No current facility-administered medications for this visit.     Musculoskeletal: Strength & Muscle Tone: within normal limits Gait & Station: normal Patient leans: N/A  Psychiatric Specialty Exam: Review of Systems  Respiratory:  Negative for cough and shortness of breath.   Cardiovascular:  Negative for chest pain.  Gastrointestinal:  Negative for abdominal pain, constipation, diarrhea, nausea and vomiting.  Neurological:  Negative for weakness and headaches.  Psychiatric/Behavioral:  Positive for dysphoric mood and sleep disturbance. Negative for hallucinations and suicidal ideas. The patient is nervous/anxious.     Blood pressure (!) 144/99, pulse 77, height 5\' 6"  (1.676 m), weight 106 lb 9.6 oz (48.4 kg), SpO2 100%.Body mass index is 17.21 kg/m.  General Appearance: Casual and Fairly Groomed  Eye Contact:  Good  Speech:  Clear and Coherent and Normal Rate  Volume:  Normal  Mood:  Anxious and Dysphoric  Affect:  Congruent and Depressed  Thought Process:  Coherent and Goal Directed  Orientation:  Full (Time, Place, and Person)  Thought Content: WDL and Logical   Suicidal Thoughts:  No  Homicidal Thoughts:  No  Memory:  Immediate;   Good Recent;   Good  Judgement:  Good  Insight:  Good  Psychomotor Activity:  Normal  Concentration:  Concentration: Good and Attention Span: Good  Recall:  Good   Fund of Knowledge: Good  Language: Good  Akathisia:  Negative  Handed:  Right  AIMS (if indicated): not done  Assets:  Communication Skills Desire for Improvement Housing Physical Health Resilience Social Support  ADL's:  Intact  Cognition: WNL  Sleep:  Poor   Screenings: GAD-7    Flowsheet Row Office Visit from 02/14/2024 in [facility] Office Visit from 06/02/2022 in [facility]  Total GAD-7 Score 13 18      PHQ2-9    Flowsheet Row Office Visit from 02/14/2024 in [facility] Office Visit from 01/11/2024 in [facility] Office Visit from 06/02/2022 in [facility]  PHQ-2 Total Score 3 5 6   PHQ-9 Total Score 12 9 23       Flowsheet Row ED to Hosp-Admission (Discharged) from 01/25/2024 in [facility] ED to Hosp-Admission (Discharged) from 01/17/2024 in [facility] ED from 01/16/2024 in [facility] Emergency Department at [facility]  C-SSRS RISK CATEGORY No Risk No Risk No Risk        Assessment and Plan:  Maxwell Faulkner is a 38 yr old male who presents for Follow Up and Medication Management.  PPHx is significant for Depression and Anxiety, and no Suicide Attempts, Self Injurious Behavior, or Hospitalizations.   Cono has had significant worsening of his depression and anxiety due to his continued medical issues.  To address this we will increase his Zoloft  at this time.  We will also start BuSpar.  He has been scheduled to start therapy as well.  Refills were sent in.  His blood pressure was elevated today and encouraged him to follow up with his PCP to address.  He return for follow-up in approximately 1 to 2 months with Dr. Chien.   MDD, Recurrant, Moderate  GAD: -Increase Zoloft  to 100 mg daily.  30 tablets with 2 refills. -Start Buspar 10 mg BID for depression and  anxiety.  60 tablets with 2 refills. -Continue Seroquel  50 mg QHS for Augmentation, sleep, and appetite.  90 tablets with 0 refills.     Insomnia: -Continue Seroquel  50 mg QHS for Augmentation, sleep, and appetite.  90 tablets with 0 refills.   Seizures: -Vimpat  200 mg daily prescribed by Neurology   Collaboration of Care:   Patient/Guardian was advised Release of Information must be obtained prior to any record release in order to collaborate their care with an outside provider. Patient/Guardian was advised if they have not already done so to contact the registration department to sign all necessary forms in order for us  to release information regarding their care.   Consent: Patient/Guardian gives verbal consent for treatment and assignment of benefits for services provided during this visit. Patient/Guardian expressed understanding and agreed to proceed.    Basilia Bosworth, DO 03/21/2024, 11:34 AM

## 2024-04-18 NOTE — Progress Notes (Deleted)
 04/18/2024 [name] [id number] May 09, 1986   CHIEF COMPLAINT: RUQ pain, colitis  HISTORY OF PRESENT ILLNESS: Maxwell Faulkner is a 38 year old male with a past medical history of anxiety, depression, hypertension, aortic atherosclerosis, seizures, polysubstance abuse, emphysema, pancreatitis 12/2023 and colitis.  He presents to our office today as referred by Ovid Blow, NP for further evaluation regarding RUQ pain, colitis and bright red blood per the rectum.  History of seizures followed by neurology.  Last seizure was 01/25/2024. On Depakote  1000 mg twice daily.     Latest Ref Rng & Units 02/11/2024    9:01 AM 01/26/2024    4:06 AM 01/25/2024    2:25 AM  CBC  WBC 4.0 - 10.5 K/uL 8.7  6.5  9.6   Hemoglobin 13.0 - 17.0 g/dL 40.9  81.1  91.4   Hematocrit 39.0 - 52.0 % 36.7  35.1  38.9   Platelets 150.0 - 400.0 K/uL 204.0  294  337        Latest Ref Rng & Units 02/11/2024    9:01 AM 01/26/2024    4:06 AM 01/25/2024    2:21 AM  CMP  Glucose 70 - 99 mg/dL 782  89  956   BUN 6 - 23 mg/dL 16  7  10    Creatinine 0.40 - 1.50 mg/dL 2.13  0.86  5.78   Sodium 135 - 145 mEq/L 139  141  141   Potassium 3.5 - 5.1 mEq/L 4.3  3.7  3.6   Chloride 96 - 112 mEq/L 103  108  106   CO2 19 - 32 mEq/L 29  25  26    Calcium 8.4 - 10.5 mg/dL 9.4  9.4  9.5   Total Protein 6.0 - 8.3 g/dL 7.0  6.5  7.4   Total Bilirubin 0.2 - 1.2 mg/dL 0.3  0.6  0.6   Alkaline Phos 39 - 117 U/L 59  46  53   AST 0 - 37 U/L 18  20  25    ALT 0 - 53 U/L 11  20  24      CTAP with contrast 01/25/2024:  FINDINGS: Lower chest:  No contributory findings.   Hepatobiliary: No focal liver abnormality.No evidence of biliary obstruction or stone.   Pancreas: Mild expansion and low-density to the pancreatic body and tail, significantly improved. No collection.   Spleen: Unremarkable.   Adrenals/Urinary Tract: Negative adrenals. No hydronephrosis or ureteral stone. 19 mm cyst at the lower pole left kidney.  Punctate interpolar calculus on the left. Unremarkable bladder.   Stomach/Bowel:  No obstruction. No visible bowel inflammation   Vascular/Lymphatic: No acute vascular abnormality. Extensive in premature atheromatous plaque and luminal irregularity of the aorta and iliacs. No mass or adenopathy.   Reproductive:No pathologic findings.  Small left hydrocele.   Other: No ascites or pneumoperitoneum.   Musculoskeletal: No acute abnormalities.   IMPRESSION: Improved pancreatitis.  No collection.   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   Tiny left renal calculus.    Past Medical History:  Diagnosis Date   Acute pancreatitis 01/17/2024   Anemia    Anxiety    Aortic atherosclerosis (HCC) 01/11/2024   Depression    History of colitis 01/11/2024   Hypertension    Pulmonary emphysema (HCC) 01/11/2024   Seizures (HCC)    Past Surgical History:  Procedure Laterality Date   WISDOM TOOTH EXTRACTION     Social History:  He reports that he has been smoking cigarettes.  He has never used smokeless tobacco. He reports that he does not currently use alcohol. He reports that he does not currently use drugs after having used the following drugs: Marijuana.  Family History: Mother with history of migraine headaches and hypertension.  Allergies  Allergen Reactions   Depakote  [Divalproex  Sodium] Other (See Comments)    Pancreatitis 12/2023      Outpatient Encounter Medications as of 04/21/2024  Medication Sig   busPIRone  (BUSPAR ) 10 MG tablet Take 1 tablet (10 mg total) by mouth 2 (two) times daily.   diazePAM , 15 MG Dose, (VALTOCO  15 MG DOSE) 2 x 7.5 MG/0.1ML LQPK Place 15 mg into the nose as needed (For seizure lasting more than 2 minutes).   famotidine  (PEPCID ) 20 MG tablet Take 1 tablet (20 mg total) by mouth 2 (two) times daily.   lacosamide  (VIMPAT ) 200 MG TABS tablet Take 1 tablet (200 mg total) by mouth 2 (two) times daily.   ondansetron  (ZOFRAN -ODT) 4 MG  disintegrating tablet Take 1 tablet (4 mg total) by mouth every 8 (eight) hours as needed for nausea or vomiting.   QUEtiapine  (SEROQUEL ) 50 MG tablet Take 1 tablet (50 mg total) by mouth at bedtime.   sertraline  (ZOLOFT ) 100 MG tablet Take 1 tablet (100 mg total) by mouth daily.   No facility-administered encounter medications on file as of 04/21/2024.   REVIEW OF SYSTEMS:  Gen: Denies fever, sweats or chills. No weight loss.  CV: Denies chest pain, palpitations or edema. Resp: Denies cough, shortness of breath of hemoptysis.  GI: Denies heartburn, dysphagia, stomach or lower abdominal pain. No diarrhea or constipation.  GU: Denies urinary burning, blood in urine, increased urinary frequency or incontinence. MS: Denies joint pain, muscles aches or weakness. Derm: Denies rash, itchiness, skin lesions or unhealing ulcers. Psych: Denies depression, anxiety, memory loss or confusion. Heme: Denies bruising, easy bleeding. Neuro:  Denies headaches, dizziness or paresthesias. Endo:  Denies any problems with DM, thyroid or adrenal function.  PHYSICAL EXAM: There were no vitals taken for this visit. General: in no acute distress. Head: Normocephalic and atraumatic. Eyes:  Sclerae non-icteric, conjunctive pink. Ears: Normal auditory acuity. Mouth: Dentition intact. No ulcers or lesions.  Neck: Supple, no lymphadenopathy or thyromegaly.  Lungs: Clear bilaterally to auscultation without wheezes, crackles or rhonchi. Heart: Regular rate and rhythm. No murmur, rub or gallop appreciated.  Abdomen: Soft, nontender, nondistended. No masses. No hepatosplenomegaly. Normoactive bowel sounds x 4 quadrants.  Rectal: Deferred.  Musculoskeletal: Symmetrical with no gross deformities. Skin: Warm and dry. No rash or lesions on visible extremities. Extremities: No edema. Neurological: Alert oriented x 4, no focal deficits.  Psychological: Alert and cooperative. Normal mood and affect.  ASSESSMENT AND  PLAN:    CC:  Henson, [name], NP-C

## 2024-04-19 ENCOUNTER — Emergency Department (HOSPITAL_COMMUNITY)

## 2024-04-19 ENCOUNTER — Other Ambulatory Visit: Payer: Self-pay

## 2024-04-19 ENCOUNTER — Encounter (HOSPITAL_COMMUNITY): Payer: Self-pay

## 2024-04-19 ENCOUNTER — Emergency Department (HOSPITAL_COMMUNITY)
Admission: EM | Admit: 2024-04-19 | Discharge: 2024-04-20 | Disposition: A | Discharge status: Home / Self Care | Attending: Emergency Medicine | Admitting: Emergency Medicine

## 2024-04-19 DIAGNOSIS — Y92410 Unspecified street and highway as the place of occurrence of the external cause: Secondary | ICD-10-CM | POA: Insufficient documentation

## 2024-04-19 DIAGNOSIS — R918 Other nonspecific abnormal finding of lung field: Secondary | ICD-10-CM | POA: Diagnosis not present

## 2024-04-19 DIAGNOSIS — S59902A Unspecified injury of left elbow, initial encounter: Secondary | ICD-10-CM | POA: Diagnosis not present

## 2024-04-19 DIAGNOSIS — M25562 Pain in left knee: Secondary | ICD-10-CM | POA: Diagnosis not present

## 2024-04-19 DIAGNOSIS — R21 Rash and other nonspecific skin eruption: Secondary | ICD-10-CM | POA: Diagnosis not present

## 2024-04-19 DIAGNOSIS — M25522 Pain in left elbow: Secondary | ICD-10-CM | POA: Insufficient documentation

## 2024-04-19 DIAGNOSIS — S299XXA Unspecified injury of thorax, initial encounter: Secondary | ICD-10-CM | POA: Diagnosis not present

## 2024-04-19 DIAGNOSIS — M542 Cervicalgia: Secondary | ICD-10-CM | POA: Diagnosis present

## 2024-04-19 DIAGNOSIS — R1031 Right lower quadrant pain: Secondary | ICD-10-CM | POA: Diagnosis not present

## 2024-04-19 DIAGNOSIS — S0990XA Unspecified injury of head, initial encounter: Secondary | ICD-10-CM | POA: Diagnosis not present

## 2024-04-19 DIAGNOSIS — M546 Pain in thoracic spine: Secondary | ICD-10-CM | POA: Diagnosis not present

## 2024-04-19 DIAGNOSIS — R569 Unspecified convulsions: Secondary | ICD-10-CM

## 2024-04-19 DIAGNOSIS — T07XXXA Unspecified multiple injuries, initial encounter: Secondary | ICD-10-CM | POA: Diagnosis not present

## 2024-04-19 DIAGNOSIS — M25572 Pain in left ankle and joints of left foot: Secondary | ICD-10-CM | POA: Insufficient documentation

## 2024-04-19 DIAGNOSIS — J439 Emphysema, unspecified: Secondary | ICD-10-CM | POA: Diagnosis not present

## 2024-04-19 DIAGNOSIS — R410 Disorientation, unspecified: Secondary | ICD-10-CM | POA: Diagnosis not present

## 2024-04-19 DIAGNOSIS — Z23 Encounter for immunization: Secondary | ICD-10-CM | POA: Insufficient documentation

## 2024-04-19 DIAGNOSIS — R519 Headache, unspecified: Secondary | ICD-10-CM | POA: Diagnosis not present

## 2024-04-19 DIAGNOSIS — Z041 Encounter for examination and observation following transport accident: Secondary | ICD-10-CM | POA: Diagnosis not present

## 2024-04-19 DIAGNOSIS — S3991XA Unspecified injury of abdomen, initial encounter: Secondary | ICD-10-CM | POA: Diagnosis not present

## 2024-04-19 DIAGNOSIS — S3993XA Unspecified injury of pelvis, initial encounter: Secondary | ICD-10-CM | POA: Diagnosis not present

## 2024-04-19 DIAGNOSIS — S199XXA Unspecified injury of neck, initial encounter: Secondary | ICD-10-CM | POA: Diagnosis not present

## 2024-04-19 DIAGNOSIS — M25552 Pain in left hip: Secondary | ICD-10-CM | POA: Diagnosis not present

## 2024-04-19 DIAGNOSIS — T148XXA Other injury of unspecified body region, initial encounter: Secondary | ICD-10-CM | POA: Diagnosis not present

## 2024-04-19 LAB — PROTIME-INR
INR: 1 (ref 0.8–1.2)
Prothrombin Time: 13.5 s (ref 11.4–15.2)

## 2024-04-19 LAB — I-STAT CG4 LACTIC ACID, ED: Lactic Acid, Venous: 0.9 mmol/L (ref 0.5–1.9)

## 2024-04-19 LAB — CBC
HCT: 39.9 % (ref 39.0–52.0)
Hemoglobin: 13.4 g/dL (ref 13.0–17.0)
MCH: 31 pg (ref 26.0–34.0)
MCHC: 33.6 g/dL (ref 30.0–36.0)
MCV: 92.4 fL (ref 80.0–100.0)
Platelets: 262 10*3/uL (ref 150–400)
RBC: 4.32 MIL/uL (ref 4.22–5.81)
RDW: 12.7 % (ref 11.5–15.5)
WBC: 8.3 10*3/uL (ref 4.0–10.5)
nRBC: 0 % (ref 0.0–0.2)

## 2024-04-19 LAB — SAMPLE TO BLOOD BANK

## 2024-04-19 LAB — COMPREHENSIVE METABOLIC PANEL WITH GFR
ALT: 11 U/L (ref 0–44)
AST: 19 U/L (ref 15–41)
Albumin: 3.8 g/dL (ref 3.5–5.0)
Alkaline Phosphatase: 70 U/L (ref 38–126)
Anion gap: 10 (ref 5–15)
BUN: 13 mg/dL (ref 6–20)
CO2: 25 mmol/L (ref 22–32)
Calcium: 10 mg/dL (ref 8.9–10.3)
Chloride: 104 mmol/L (ref 98–111)
Creatinine, Ser: 1.01 mg/dL (ref 0.61–1.24)
GFR, Estimated: >60 mL/min (ref >60)
Glucose, Bld: 113 mg/dL — ABNORMAL HIGH (ref 70–99)
Potassium: 4.1 mmol/L (ref 3.5–5.1)
Sodium: 139 mmol/L (ref 135–145)
Total Bilirubin: 0.4 mg/dL (ref 0.0–1.2)
Total Protein: 7.1 g/dL (ref 6.5–8.1)

## 2024-04-19 LAB — I-STAT CHEM 8, ED
BUN: 16 mg/dL (ref 6–20)
Calcium, Ion: 1.12 mmol/L — ABNORMAL LOW (ref 1.15–1.40)
Chloride: 105 mmol/L (ref 98–111)
Creatinine, Ser: 1 mg/dL (ref 0.61–1.24)
Glucose, Bld: 108 mg/dL — ABNORMAL HIGH (ref 70–99)
HCT: 40 % (ref 39.0–52.0)
Hemoglobin: 13.6 g/dL (ref 13.0–17.0)
Potassium: 4.1 mmol/L (ref 3.5–5.1)
Sodium: 139 mmol/L (ref 135–145)
TCO2: 26 mmol/L (ref 22–32)

## 2024-04-19 LAB — URINALYSIS, ROUTINE W REFLEX MICROSCOPIC
Bilirubin Urine: NEGATIVE
Glucose, UA: NEGATIVE mg/dL
Hgb urine dipstick: NEGATIVE
Ketones, ur: NEGATIVE mg/dL
Leukocytes,Ua: NEGATIVE
Nitrite: NEGATIVE
Protein, ur: NEGATIVE mg/dL
Specific Gravity, Urine: 1.024 (ref 1.005–1.030)
pH: 5 (ref 5.0–8.0)

## 2024-04-19 LAB — RAPID URINE DRUG SCREEN, HOSP PERFORMED
Amphetamines: NOT DETECTED
Barbiturates: NOT DETECTED
Benzodiazepines: NOT DETECTED
Cocaine: NOT DETECTED
Opiates: NOT DETECTED
Tetrahydrocannabinol: POSITIVE — AB

## 2024-04-19 LAB — ETHANOL: Alcohol, Ethyl (B): <15 mg/dL (ref <15)

## 2024-04-19 MED ORDER — MORPHINE SULFATE (PF) 4 MG/ML IV SOLN
4.0000 mg | Freq: Once | INTRAVENOUS | Status: AC
  Administered 2024-04-19: 4 mg via INTRAVENOUS
  Filled 2024-04-19: qty 1

## 2024-04-19 MED ORDER — LACOSAMIDE 50 MG PO TABS
200.0000 mg | ORAL_TABLET | Freq: Two times a day (BID) | ORAL | Status: DC
  Administered 2024-04-19: 200 mg via ORAL
  Filled 2024-04-19: qty 4

## 2024-04-19 MED ORDER — IOHEXOL 350 MG/ML SOLN
75.0000 mL | Freq: Once | INTRAVENOUS | Status: AC | PRN
  Administered 2024-04-19: 75 mL via INTRAVENOUS

## 2024-04-19 MED ORDER — LORAZEPAM 2 MG/ML IJ SOLN
INTRAMUSCULAR | Status: AC
  Administered 2024-04-19: 2 mg
  Filled 2024-04-19: qty 1

## 2024-04-19 MED ORDER — TETANUS-DIPHTH-ACELL PERTUSSIS 5-2.5-18.5 LF-MCG/0.5 IM SUSY
0.5000 mL | PREFILLED_SYRINGE | Freq: Once | INTRAMUSCULAR | Status: AC
  Administered 2024-04-19: 0.5 mL via INTRAMUSCULAR
  Filled 2024-04-19: qty 0.5

## 2024-04-19 MED ORDER — KETOROLAC TROMETHAMINE 15 MG/ML IJ SOLN
15.0000 mg | Freq: Once | INTRAMUSCULAR | Status: AC
  Administered 2024-04-19: 15 mg via INTRAVENOUS
  Filled 2024-04-19: qty 1

## 2024-04-19 MED ORDER — FENTANYL CITRATE PF 50 MCG/ML IJ SOSY
50.0000 ug | PREFILLED_SYRINGE | Freq: Once | INTRAMUSCULAR | Status: AC
  Administered 2024-04-19: 50 ug via INTRAVENOUS
  Filled 2024-04-19: qty 1

## 2024-04-19 NOTE — Discharge Instructions (Signed)
No driving for six months

## 2024-04-19 NOTE — ED Notes (Signed)
 While cleaning and bandaging pt wounds, pt stopped talking and his eye began twitching. Pt then began convulsing for approx 30--45 seconds. This provider asked for help from RN at the work stations and rolled pt to his side until convulsions stopped. PA Groce at bedside. Verbal orders for ativan  given. Pt currently postictal, vitals stable, will continue to monitor.

## 2024-04-19 NOTE — ED Notes (Signed)
 Patient transported to CT by trauma RN
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
## 2024-04-19 NOTE — Progress Notes (Signed)
 Orthopedic Tech Progress Note Patient Details:  Maxwell Faulkner 01/13/1986 [id number]  Patient ID: [name], male   DOB: [birth year], 38 y.o.   MRN: [id number] I attended trauma page. Chandra Dorn PARAS 04/19/2024, 9:01 PM

## 2024-04-19 NOTE — ED Notes (Signed)
 Wife at bedside, updated regarding plan of care.

## 2024-04-20 MED ORDER — LEVETIRACETAM (KEPPRA) 500 MG/5 ML ADULT IV PUSH
1000.0000 mg | Freq: Once | INTRAVENOUS | Status: AC
  Administered 2024-04-20: 1000 mg via INTRAVENOUS
  Filled 2024-04-20: qty 10

## 2024-04-20 MED ORDER — ACETAMINOPHEN 500 MG PO TABS
1000.0000 mg | ORAL_TABLET | Freq: Once | ORAL | Status: AC
  Administered 2024-04-20: 1000 mg via ORAL
  Filled 2024-04-20: qty 2

## 2024-04-20 MED ORDER — DICLOFENAC SODIUM ER 100 MG PO TB24: 100.0000 mg | ORAL_TABLET | Freq: Every day | ORAL | 0 refills | Status: DC

## 2024-04-20 MED ORDER — LIDOCAINE 5 % EX PTCH
3.0000 | MEDICATED_PATCH | CUTANEOUS | Status: DC
  Administered 2024-04-20: 3 via TRANSDERMAL
  Filled 2024-04-20: qty 3

## 2024-04-20 MED ORDER — LIDOCAINE 5 % EX PTCH: 1.0000 | MEDICATED_PATCH | CUTANEOUS | 0 refills | Status: DC

## 2024-04-20 NOTE — ED Notes (Addendum)
 Patient more easily arousable. Patient still postictal.

## 2024-04-21 ENCOUNTER — Ambulatory Visit: Admitting: Nurse Practitioner
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
## 2024-04-29 ENCOUNTER — Encounter (HOSPITAL_COMMUNITY): Admitting: Psychiatry

## 2024-05-06 ENCOUNTER — Ambulatory Visit (HOSPITAL_COMMUNITY): Admitting: Licensed Clinical Social Worker

## 2024-05-14 ENCOUNTER — Ambulatory Visit: Admitting: Family Medicine
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
## 2024-05-22 ENCOUNTER — Encounter (HOSPITAL_COMMUNITY): Admitting: Psychiatry

## 2024-06-04 ENCOUNTER — Ambulatory Visit (HOSPITAL_COMMUNITY): Admitting: Licensed Clinical Social Worker

## 2024-06-10 NOTE — Progress Notes (Deleted)
 [facility] MD/PA/NP OP Progress Note  06/10/2024 11:15 AM Maxwell Faulkner  MRN:  [id number]  Chief Complaint:  No chief complaint on file.  HPI:  Maxwell Faulkner. Maxwell Faulkner is a 38 yr old male who presents for Follow Up and Medication Management.  PPHx is significant for Depression and Anxiety, and no Suicide Attempts, Self Injurious Behavior, or Hospitalizations.   He reports he has had significant worsening of his depression and anxiety since his last appointment.  He reports that he had a string of 5 seizures and was hospitalized.  He reports not remembering most of the hospitalization.  He reports he has just been tired since then.  Reports ongoing stress due to his financial situation.  He reports because of the seizures and not being able to work he is unable to help pay the bills for staying at his mother's.  He reports there have been times where when he was going to sleep he hoped he would not wake up.  He reports he is never had any thoughts of doing anything to make that happen because when those thoughts happen he begins to think of his children.  Discussed with him that we would approach this in multiple ways.  Discussed first increasing his Zoloft  as he was still at 50 mg.  Also discussed starting BuSpar  to address his anxiety but also help further with his depression.  Discussed starting therapy and he was agreeable with this.  Discussed with him what to do in the event of a future crisis.  Discussed that he can go to [facility], go to the nearest ED, or call 911 or [phone].   He reported understanding.  He reports no SI, HI, or AVH.  He reports sleep is been fair.  He reports his appetite has been fair.  He reports no side effects to his medications.  He reports no other concerns at present.  Discussed with him that he would be seen by another provider next time and he reported understanding.  He will return for follow-up in approximately 1 to 2 months with Dr. Chien.  Discussed with him that his blood pressure was  elevated today and encouraged him to follow up with his PCP about this.  He reports he will do so.    Visit Diagnosis:  No diagnosis found.       Past Psychiatric History: Depression and Anxiety, and no Suicide Attempts, Self Injurious Behavior, or Hospitalizations.  Past Medical History:  Past Medical History:  Diagnosis Date   Acute pancreatitis 01/17/2024   Anemia    Anxiety    Aortic atherosclerosis (HCC) 01/11/2024   Depression    History of colitis 01/11/2024   Hypertension    Pulmonary emphysema (HCC) 01/11/2024   Seizures (HCC)     Past Surgical History:  Procedure Laterality Date   WISDOM TOOTH EXTRACTION      Family Psychiatric History: No Known Diagnosis', Substance Abuse, or Suicides.   Family History:  Family History  Problem Relation Age of Onset   Migraines Mother    Hypertension Mother     Social History:  Social History   Socioeconomic History   Marital status: Significant Other    Spouse name: Maxwell Faulkner   Number of children: 3   Years of education: Not on file   Highest education level: Not on file  Occupational History   Not on file  Tobacco Use   Smoking status: Every Day    Current packs/day: 1.00    Types:  Cigarettes   Smokeless tobacco: Never  Vaping Use   Vaping status: Never Used  Substance and Sexual Activity   Alcohol use: Not Currently    Comment: stopped drinking   Drug use: Not Currently    Types: Marijuana    Comment: last use 2 weeks ago   Sexual activity: Yes    Birth control/protection: None  Other Topics Concern   Not on file  Social History Narrative   Right handed   Caffeine 1 cup daily   Social Drivers of Health   Financial Resource Strain: Not on file  Food Insecurity: No Food Insecurity (01/25/2024)   Hunger Vital Sign    Worried About Running Out of Food in the Last Year: Never true    Ran Out of Food in the Last Year: Never true  Transportation Needs: No Transportation Needs (01/25/2024)    PRAPARE - Administrator, Civil Service (Medical): No    Lack of Transportation (Non-Medical): No  Physical Activity: Not on file  Stress: Not on file  Social Connections: Patient Declined (01/25/2024)   Social Connection and Isolation Panel    Frequency of Communication with Friends and Family: Patient declined    Frequency of Social Gatherings with Friends and Family: Patient declined    Attends Religious Services: Patient declined    Database administrator or Organizations: Patient declined    Attends Banker Meetings: Patient declined    Marital Status: Patient declined    Allergies:  Allergies  Allergen Reactions   Depakote  [Divalproex  Sodium] Other (See Comments)    Pancreatitis 12/2023    Metabolic Disorder Labs: No results found for: HGBA1C, MPG No results found for: PROLACTIN Lab Results  Component Value Date   CHOL 164 02/11/2024   TRIG 144.0 02/11/2024   HDL 42.30 02/11/2024   CHOLHDL 4 02/11/2024   VLDL 28.8 02/11/2024   LDLCALC 93 02/11/2024   LDLCALC 81 01/17/2024   No results found for: TSH  Therapeutic Level Labs: No results found for: LITHIUM Lab Results  Component Value Date   VALPROATE 58 01/18/2024   VALPROATE 65 01/16/2024   No results found for: CBMZ  Current Medications: Current Outpatient Medications  Medication Sig Dispense Refill   busPIRone  (BUSPAR ) 10 MG tablet Take 1 tablet (10 mg total) by mouth 2 (two) times daily. 60 tablet 2   diazePAM , 15 MG Dose, (VALTOCO  15 MG DOSE) 2 x 7.5 MG/0.1ML LQPK Place 15 mg into the nose as needed (For seizure lasting more than 2 minutes). 5 each 1   Diclofenac  Sodium CR 100 MG 24 hr tablet Take 1 tablet (100 mg total) by mouth daily. 10 tablet 0   famotidine  (PEPCID ) 20 MG tablet Take 1 tablet (20 mg total) by mouth 2 (two) times daily. (Patient not taking: Reported on 04/19/2024) 60 tablet 0   lacosamide  (VIMPAT ) 200 MG TABS tablet Take 1 tablet (200 mg total) by mouth  2 (two) times daily. 180 tablet 3   lidocaine  (LIDODERM ) 5 % Place 1 patch onto the skin daily. Remove & Discard patch within 12 hours or as directed by MD 30 patch 0   ondansetron  (ZOFRAN -ODT) 4 MG disintegrating tablet Take 1 tablet (4 mg total) by mouth every 8 (eight) hours as needed for nausea or vomiting. (Patient not taking: Reported on 04/19/2024) 20 tablet 0   QUEtiapine  (SEROQUEL ) 50 MG tablet Take 1 tablet (50 mg total) by mouth at bedtime. 90 tablet 0   sertraline  (ZOLOFT )  100 MG tablet Take 1 tablet (100 mg total) by mouth daily. 30 tablet 2   No current facility-administered medications for this visit.     Musculoskeletal: Strength & Muscle Tone: within normal limits Gait & Station: normal Patient leans: N/A  Psychiatric Specialty Exam: Review of Systems  Respiratory:  Negative for cough and shortness of breath.   Cardiovascular:  Negative for chest pain.  Gastrointestinal:  Negative for abdominal pain, constipation, diarrhea, nausea and vomiting.  Neurological:  Negative for weakness and headaches.  Psychiatric/Behavioral:  Positive for dysphoric mood and sleep disturbance. Negative for hallucinations and suicidal ideas. The patient is nervous/anxious.     There were no vitals taken for this visit.There is no height or weight on file to calculate BMI.  General Appearance: Casual and Fairly Groomed  Eye Contact:  Good  Speech:  Clear and Coherent and Normal Rate  Volume:  Normal  Mood:  Anxious and Dysphoric  Affect:  Congruent and Depressed  Thought Process:  Coherent and Goal Directed  Orientation:  Full (Time, Place, and Person)  Thought Content: WDL and Logical   Suicidal Thoughts:  No  Homicidal Thoughts:  No  Memory:  Immediate;   Good Recent;   Good  Judgement:  Good  Insight:  Good  Psychomotor Activity:  Normal  Concentration:  Concentration: Good and Attention Span: Good  Recall:  Good  Fund of Knowledge: Good  Language: Good  Akathisia:  Negative   Handed:  Right  AIMS (if indicated): not done  Assets:  Communication Skills Desire for Improvement Housing Physical Health Resilience Social Support  ADL's:  Intact  Cognition: WNL  Sleep:  Poor   Screenings: GAD-7    Flowsheet Row Office Visit from 02/14/2024 in [facility] Office Visit from 06/02/2022 in [facility]  Total GAD-7 Score 13 18   PHQ2-9    Flowsheet Row Office Visit from 02/14/2024 in [facility] Office Visit from 01/11/2024 in [facility] Office Visit from 06/02/2022 in [place]  PHQ-2 Total Score 3 5 6   PHQ-9 Total Score 12 9 23    Flowsheet Row ED from 04/19/2024 in [facility] Emergency Department at [facility] ED to Hosp-Admission (Discharged) from 01/25/2024 in [facility] ED to Hosp-Admission (Discharged) from 01/17/2024 in [facility]  C-SSRS RISK CATEGORY No Risk No Risk No Risk     Assessment and Plan:  Jiyan L. Fillingim is a 38 yr old male who presents for Follow Up and Medication Management.  PPHx is significant for Depression and Anxiety, and no Suicide Attempts, Self Injurious Behavior, or Hospitalizations.   Bode has had significant worsening of his depression and anxiety due to his continued medical issues.  To address this we will increase his Zoloft  at this time.  We will also start BuSpar .  He has been scheduled to start therapy as well.  Refills were sent in.  His blood pressure was elevated today and encouraged him to follow up with his PCP to address.  He return for follow-up in approximately 1 to 2 months with Dr. Chien.   MDD, Recurrant, Moderate  GAD: -Increase Zoloft  to 100 mg daily.  30 tablets with 2 refills. -Start Buspar  10 mg BID for depression and anxiety.  60 tablets with 2 refills. -Continue Seroquel  50 mg QHS for Augmentation,  sleep, and appetite.  90 tablets with 0  refills.     Insomnia: -Continue Seroquel  50 mg QHS for Augmentation, sleep, and appetite.  90 tablets with 0 refills.   Seizures: -Vimpat  200 mg daily prescribed by Neurology   Collaboration of Care:   Patient/Guardian was advised Release of Information must be obtained prior to any record release in order to collaborate their care with an outside provider. Patient/Guardian was advised if they have not already done so to contact the registration department to sign all necessary forms in order for us  to release information regarding their care.   Consent: Patient/Guardian gives verbal consent for treatment and assignment of benefits for services provided during this visit. Patient/Guardian expressed understanding and agreed to proceed.    Samyah Bilbo, MD 06/10/2024, 11:15 AM

## 2024-06-11 ENCOUNTER — Telehealth: Payer: Self-pay | Admitting: *Deleted

## 2024-06-11 DIAGNOSIS — R569 Unspecified convulsions: Secondary | ICD-10-CM

## 2024-06-11 DIAGNOSIS — J439 Emphysema, unspecified: Secondary | ICD-10-CM

## 2024-06-11 NOTE — Progress Notes (Signed)
 Complex Care Management Note Care Guide Note  06/11/2024 Name: ELAINE MIDDLETON MRN: [id number] DOB: [birth year]   Complex Care Management Outreach Attempts: An unsuccessful telephone outreach was attempted today to offer the patient information about available complex care management services.  Follow Up Plan:  Additional outreach attempts will be made to offer the patient complex care management information and services.   Encounter Outcome:  No Answer  Harlene Satterfield  [facility]  [facility], [facility] Guide  Direct Dial: [phone]  Fax [phone]

## 2024-06-12 ENCOUNTER — Encounter (HOSPITAL_COMMUNITY)

## 2024-06-13 NOTE — Progress Notes (Signed)
 Complex Care Management Note  Care Guide Note 06/13/2024 Name: Maxwell Faulkner MRN: [id number] DOB: 1986/07/23  Maxwell Faulkner is a 38 y.o. year old male who sees [place], [place] L, NP-C for primary care. I reached out to Maxwell Faulkner by phone today to offer complex care management services.  Mr. Warrell was given information about Complex Care Management services today including:   The Complex Care Management services include support from the care team which includes your Nurse Care Manager, Clinical Social Worker, or Pharmacist.  The Complex Care Management team is here to help remove barriers to the health concerns and goals most important to you. Complex Care Management services are voluntary, and the patient may decline or stop services at any time by request to their care team member.   Complex Care Management Consent Status: Patient agreed to services and verbal consent obtained.   Follow up plan:  Telephone appointment with complex care management team member scheduled for:  06/17/24  Encounter Outcome:  Patient Scheduled  Harlene Satterfield  [facility]  [facility], [facility] Guide  Direct Dial: [phone]  Fax [phone]

## 2024-06-17 ENCOUNTER — Telehealth: Payer: Self-pay | Admitting: *Deleted

## 2024-06-17 ENCOUNTER — Encounter: Payer: Self-pay | Admitting: *Deleted

## 2024-06-17 NOTE — Patient Instructions (Signed)
 [name] - I am sorry I was unable to reach you today for our scheduled appointment. I work with Lendia, [name], NP-C and am calling to support your healthcare needs. Please contact me at [phone] at your earliest convenience. I look forward to speaking with you soon.   Thank you,  Rosina Forte, BSN RN [facility], [facility] RN Care Manager Direct Dial: [phone]  Fax: [phone]

## 2024-06-24 ENCOUNTER — Ambulatory Visit (HOSPITAL_COMMUNITY): Admitting: Licensed Clinical Social Worker

## 2024-07-02 ENCOUNTER — Encounter: Payer: Self-pay | Admitting: Neurology

## 2024-07-02 NOTE — Telephone Encounter (Signed)
 CALLED PT per his request and stated that he wants to get a life alert and what the process is and if insurance will cover due to sz.   Pt reported having a sz last week on Thursday or Friday when he was alone. Pt stated that he didn't miss sz medication. The most recent sz scared him into wanting a life alert. Pt was informed that its important to inform [facility] and Dr. Gregg each and every time he has a seizure. Pt voiced understanding of all discussed

## 2024-07-05 ENCOUNTER — Encounter (HOSPITAL_COMMUNITY): Payer: Self-pay

## 2024-07-07 ENCOUNTER — Telehealth: Payer: Self-pay | Admitting: *Deleted

## 2024-07-07 NOTE — Progress Notes (Unsigned)
 Complex Care Management Care Guide Note  07/07/2024 Name: Maxwell Faulkner MRN: [id number] DOB: [birth year]  Maxwell Faulkner is a 38 y.o. year old male who is a primary care patient of Lendia Boby LITTIE, NP-C and is actively engaged with the care management team. I reached out to Maxwell Faulkner by phone today to assist with re-scheduling  with the RN Case Manager.  Follow up plan: Unsuccessful telephone outreach attempt made. A HIPAA compliant phone message was left for the patient providing contact information and requesting a return call.  Thedford Franks, CMA [place]  [facility], [facility] Guide Direct Dial: [phone]  Fax: [phone] Website: [email or web address]

## 2024-07-08 NOTE — Progress Notes (Signed)
 Complex Care Management Care Guide Note  07/08/2024 Name: Maxwell Faulkner MRN: [id number] DOB: Jul 22, 1986  Maxwell Faulkner is a 38 y.o. year old male who is a primary care patient of Lendia Boby LITTIE, NP-C and is actively engaged with the care management team. I reached out to Maxwell Faulkner by phone today to assist with re-scheduling  with the RN Case Manager.  Follow up plan: Unsuccessful telephone outreach attempt made. A HIPAA compliant phone message was left for the patient providing contact information and requesting a return call. No further outreach attempts will be made due to inability to maintain patient contact.   Maxwell Faulkner, CMA [place]  [facility], [facility] Guide Direct Dial: [phone]  Fax: [phone] Website: [email or web address]

## 2024-07-19 ENCOUNTER — Encounter (HOSPITAL_COMMUNITY): Payer: Self-pay

## 2024-07-24 ENCOUNTER — Emergency Department (HOSPITAL_COMMUNITY)
Admission: EM | Admit: 2024-07-24 | Discharge: 2024-07-25 | Disposition: A | Discharge status: Home / Self Care | Attending: Emergency Medicine | Admitting: Emergency Medicine

## 2024-07-24 ENCOUNTER — Emergency Department (HOSPITAL_COMMUNITY)

## 2024-07-24 ENCOUNTER — Other Ambulatory Visit: Payer: Self-pay

## 2024-07-24 DIAGNOSIS — R112 Nausea with vomiting, unspecified: Secondary | ICD-10-CM | POA: Insufficient documentation

## 2024-07-24 DIAGNOSIS — R059 Cough, unspecified: Secondary | ICD-10-CM | POA: Diagnosis not present

## 2024-07-24 DIAGNOSIS — Z79899 Other long term (current) drug therapy: Secondary | ICD-10-CM | POA: Diagnosis not present

## 2024-07-24 DIAGNOSIS — I10 Essential (primary) hypertension: Secondary | ICD-10-CM | POA: Diagnosis not present

## 2024-07-24 DIAGNOSIS — R1013 Epigastric pain: Secondary | ICD-10-CM | POA: Insufficient documentation

## 2024-07-24 LAB — URINALYSIS, ROUTINE W REFLEX MICROSCOPIC
Bacteria, UA: NONE SEEN
Glucose, UA: NEGATIVE mg/dL
Hgb urine dipstick: NEGATIVE
Ketones, ur: 5 mg/dL — AB
Leukocytes,Ua: NEGATIVE
Nitrite: NEGATIVE
Protein, ur: 30 mg/dL — AB
Specific Gravity, Urine: 1.032 — ABNORMAL HIGH (ref 1.005–1.030)
pH: 5 (ref 5.0–8.0)

## 2024-07-24 LAB — CBC
HCT: 36.2 % — ABNORMAL LOW (ref 39.0–52.0)
Hemoglobin: 12 g/dL — ABNORMAL LOW (ref 13.0–17.0)
MCH: 30.5 pg (ref 26.0–34.0)
MCHC: 33.1 g/dL (ref 30.0–36.0)
MCV: 92.1 fL (ref 80.0–100.0)
Platelets: 221 K/uL (ref 150–400)
RBC: 3.93 MIL/uL — ABNORMAL LOW (ref 4.22–5.81)
RDW: 13.6 % (ref 11.5–15.5)
WBC: 7.2 K/uL (ref 4.0–10.5)
nRBC: 0 % (ref 0.0–0.2)

## 2024-07-24 NOTE — ED Triage Notes (Signed)
 Pt coming in reporting nausea vomiting and a cough . Pt reporting chills. Pt reports he felt this way before he started having seizures last time.

## 2024-07-25 LAB — COMPREHENSIVE METABOLIC PANEL WITH GFR
ALT: 11 U/L (ref 0–44)
AST: 23 U/L (ref 15–41)
Albumin: 3.6 g/dL (ref 3.5–5.0)
Alkaline Phosphatase: 67 U/L (ref 38–126)
Anion gap: 12 (ref 5–15)
BUN: 8 mg/dL (ref 6–20)
CO2: 22 mmol/L (ref 22–32)
Calcium: 8.9 mg/dL (ref 8.9–10.3)
Chloride: 105 mmol/L (ref 98–111)
Creatinine, Ser: 0.94 mg/dL (ref 0.61–1.24)
GFR, Estimated: >60 mL/min (ref >60)
Glucose, Bld: 162 mg/dL — ABNORMAL HIGH (ref 70–99)
Potassium: 3.4 mmol/L — ABNORMAL LOW (ref 3.5–5.1)
Sodium: 139 mmol/L (ref 135–145)
Total Bilirubin: 0.7 mg/dL (ref 0.0–1.2)
Total Protein: 6.8 g/dL (ref 6.5–8.1)

## 2024-07-25 LAB — LIPASE, BLOOD: Lipase: 19 U/L (ref 11–51)

## 2024-07-25 MED ORDER — PROCHLORPERAZINE EDISYLATE 10 MG/2ML IJ SOLN
10.0000 mg | Freq: Once | INTRAMUSCULAR | Status: AC
  Administered 2024-07-25: 10 mg via INTRAVENOUS
  Filled 2024-07-25: qty 2

## 2024-07-25 MED ORDER — OXYCODONE-ACETAMINOPHEN 5-325 MG PO TABS
1.0000 | ORAL_TABLET | Freq: Once | ORAL | Status: AC
  Administered 2024-07-25: 1 via ORAL
  Filled 2024-07-25: qty 1

## 2024-07-25 MED ORDER — HYDROMORPHONE HCL 1 MG/ML IJ SOLN
0.5000 mg | Freq: Once | INTRAMUSCULAR | Status: AC
  Administered 2024-07-25: 0.5 mg via INTRAVENOUS
  Filled 2024-07-25: qty 1

## 2024-07-25 MED ORDER — ONDANSETRON HCL 4 MG/2ML IJ SOLN
4.0000 mg | Freq: Once | INTRAMUSCULAR | Status: AC
  Administered 2024-07-25: 4 mg via INTRAVENOUS
  Filled 2024-07-25: qty 2

## 2024-07-25 MED ORDER — HYDROCODONE-ACETAMINOPHEN 5-325 MG PO TABS: 1.0000 | ORAL_TABLET | Freq: Four times a day (QID) | ORAL | 0 refills | Status: DC | PRN

## 2024-07-25 NOTE — Discharge Instructions (Signed)

## 2024-07-25 NOTE — ED Notes (Signed)
 Pt ambulated to restroom.

## 2024-07-25 NOTE — ED Provider Notes (Signed)
 [place] EMERGENCY DEPARTMENT AT [facility] Provider Note   CSN: [id number] Arrival date & time: 07/24/24  2300     Patient presents with: Emesis and Cough   Maxwell Faulkner is a 38 y.o. male.   The history is provided by the patient.  Patient w/history of idiopathic pancreatitis, seizures, pulmonary emphysema, hypertension presents for multiple complaints Patient reports over the past day he has had increasing nausea vomiting and abdominal pain consistent with his previous pancreatitis.  No alcohol use is reported No previous abdominal surgeries.  He also reports recent cough and congestion.  He also reports headaches.  He is also concerned that he may have another seizure.  Last seizure was over a week ago.   Past Medical History:  Diagnosis Date   Acute pancreatitis 01/17/2024   Anemia    Anxiety    Aortic atherosclerosis 01/11/2024   Depression    History of colitis 01/11/2024   Hypertension    Pulmonary emphysema (HCC) 01/11/2024   Seizures (HCC)     Prior to Admission medications   Medication Sig Start Date End Date Taking? Authorizing Provider  HYDROcodone -acetaminophen  (NORCO/VICODIN) 5-325 MG tablet Take 1 tablet by mouth every 6 (six) hours as needed. 07/25/24  Yes Midge Golas, MD  busPIRone  (BUSPAR ) 10 MG tablet Take 1 tablet (10 mg total) by mouth 2 (two) times daily. 03/21/24   Pashayan, Marsa RAMAN, DO  diazePAM , 15 MG Dose, (VALTOCO  15 MG DOSE) 2 x 7.5 MG/0.1ML LQPK Place 15 mg into the nose as needed (For seizure lasting more than 2 minutes). 02/14/24   Gregg Lek, MD  Diclofenac  Sodium CR 100 MG 24 hr tablet Take 1 tablet (100 mg total) by mouth daily. 04/20/24   Palumbo, April, MD  famotidine  (PEPCID ) 20 MG tablet Take 1 tablet (20 mg total) by mouth 2 (two) times daily. Patient not taking: Reported on 04/19/2024 01/26/24 02/25/24  Ghimire, Kuber, MD  lacosamide  (VIMPAT ) 200 MG TABS tablet Take 1 tablet (200 mg total) by mouth 2 (two) times  daily. 02/14/24 02/08/25  Gregg Lek, MD  lidocaine  (LIDODERM ) 5 % Place 1 patch onto the skin daily. Remove & Discard patch within 12 hours or as directed by MD 04/20/24   Nettie, April, MD  ondansetron  (ZOFRAN -ODT) 4 MG disintegrating tablet Take 1 tablet (4 mg total) by mouth every 8 (eight) hours as needed for nausea or vomiting. Patient not taking: Reported on 04/19/2024 01/16/24   Zelaya, Oscar A, PA-C  QUEtiapine  (SEROQUEL ) 50 MG tablet Take 1 tablet (50 mg total) by mouth at bedtime. 03/21/24   Raliegh Marsa RAMAN, DO  sertraline  (ZOLOFT ) 100 MG tablet Take 1 tablet (100 mg total) by mouth daily. 03/21/24   Raliegh Marsa RAMAN, DO    Allergies: Depakote  [divalproex  sodium]    Review of Systems  Constitutional:  Negative for fever.  Respiratory:  Positive for cough.   Gastrointestinal:  Positive for abdominal pain, constipation and vomiting. Negative for blood in stool.       Reports blood mixed in vomit  Neurological:  Positive for headaches.    Updated Vital Signs BP 131/80   Pulse 83   Temp 98.5 F (36.9 C) (Oral)   Resp 20   SpO2 100%   Physical Exam CONSTITUTIONAL: Thin appearing, crying HEAD: Normocephalic/atraumatic EYES: EOMI/PERRL, no icterus, conjunctival pink ENMT: Mucous membranes moist, no blood in the mouth or oropharynx or nose NECK: supple no meningeal signs CV: S1/S2 noted, no murmurs/rubs/gallops noted LUNGS: Lungs are  clear to auscultation bilaterally, no apparent distress ABDOMEN: soft, diffuse moderate tenderness, no rebound or guarding, bowel sounds noted throughout abdomen GU:no cva tenderness NEURO: Pt is awake/alert/appropriate, moves all extremitiesx4.  No facial droop.   EXTREMITIES: pulses normal/equal, full ROM SKIN: warm, color normal PSYCH: Anxious and tearful  (all labs ordered are listed, but only abnormal results are displayed) Labs Reviewed  COMPREHENSIVE METABOLIC PANEL WITH GFR - Abnormal; Notable for the following components:       Result Value   Potassium 3.4 (*)    Glucose, Bld 162 (*)    All other components within normal limits  CBC - Abnormal; Notable for the following components:   RBC 3.93 (*)    Hemoglobin 12.0 (*)    HCT 36.2 (*)    All other components within normal limits  URINALYSIS, ROUTINE W REFLEX MICROSCOPIC - Abnormal; Notable for the following components:   Color, Urine AMBER (*)    APPearance HAZY (*)    Specific Gravity, Urine 1.032 (*)    Bilirubin Urine SMALL (*)    Ketones, ur 5 (*)    Protein, ur 30 (*)    All other components within normal limits  LIPASE, BLOOD    EKG: None  Radiology: DG Chest 2 View Result Date: 07/24/2024 CLINICAL DATA:  Cough EXAM: CHEST - 2 VIEW COMPARISON:  04/19/2024 FINDINGS: Bullous changes in the apices, stable. Heart and mediastinal contours are within normal limits. No focal opacities or effusions. No acute bony abnormality. IMPRESSION: Bullous emphysematous changes in the apices. No acute bony abnormality. Electronically Signed   By: Franky Crease M.D.   On: 07/24/2024 23:37     Procedures   Medications Ordered in the ED  oxyCODONE -acetaminophen  (PERCOCET/ROXICET) 5-325 MG per tablet 1 tablet (1 tablet Oral Given 07/25/24 0104)  HYDROmorphone  (DILAUDID ) injection 0.5 mg (0.5 mg Intravenous Given 07/25/24 0450)  ondansetron  (ZOFRAN ) injection 4 mg (4 mg Intravenous Given 07/25/24 0450)  prochlorperazine  (COMPAZINE ) injection 10 mg (10 mg Intravenous Given 07/25/24 0553)  HYDROmorphone  (DILAUDID ) injection 0.5 mg (0.5 mg Intravenous Given 07/25/24 0553)    Clinical Course as of 07/25/24 0651  Fri Jul 25, 2024  0441 Patient reports his main issue is increasing abdominal pain with vomiting similar to prior episodes of pancreatitis.  Patient also mentions multiple other issues including headache, cough, congestion and fear of seizures.  Overall his labs are reassuring  Will treat his pain and reassess.  If his pain continues to accelerate, we will  proceed with CT imaging of his abdomen pelvis [DW]  0442 Glucose(!): 162 Hyperglycemia [DW]  0651 Overall patient reports feeling improved.  He has no focal abdominal tenderness.  No further vomiting.  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  Patient encouraged to cut back on marijuana use  Overall patient safe for discharge home.  He reports he has his seizure meds at home.   [DW]    Clinical Course User Index [DW] Midge Golas, MD                                 Medical Decision Making Amount and/or Complexity of Data Reviewed Labs: ordered. Decision-making details documented in ED Course. Radiology: ordered.  Risk Prescription drug management.   This patient presents to the ED for concern of abdominal pain, this involves an extensive number of treatment options, and is a complaint that carries with  it a high risk of complications and morbidity.  The differential diagnosis includes but is not limited to cholecystitis, cholelithiasis, pancreatitis, gastritis, peptic ulcer disease, appendicitis, bowel obstruction, bowel perforation, diverticulitis, AAA, ischemic bowel, acute coronary syndrome   Comorbidities that complicate the patient evaluation: Patient's presentation is complicated by their history of pancreatitis  Social Determinants of Health: Patient's ongoing tobacco use  increases the complexity of managing their presentation  Additional history obtained: Records reviewed previous admission documents  Lab Tests: I Ordered, and personally interpreted labs.  The pertinent results include: Labs overall unremarkable  Imaging Studies ordered: I ordered imaging studies including X-ray chest  I independently visualized and interpreted imaging which showed no acute findings I agree with the radiologist interpretation  Medicines ordered and prescription drug management: I ordered medication including Dilaudid  for  pain Reevaluation of the patient after these medicines showed that the patient    improved  Test Considered: I considered CT imaging, but since he is improving with multiple imaging modalities previously will defer for now  Reevaluation: After the interventions noted above, I reevaluated the patient and found that they have :improved  Complexity of problems addressed: Patient's presentation is most consistent with  acute presentation with potential threat to life or bodily function  Disposition: After consideration of the diagnostic results and the patient's response to treatment,  I feel that the patent would benefit from discharge  .        Final diagnoses:  Epigastric pain  Nausea and vomiting, unspecified vomiting type    ED Discharge Orders          Ordered    HYDROcodone -acetaminophen  (NORCO/VICODIN) 5-325 MG tablet  Every 6 hours PRN        07/25/24 0650               Midge Golas, MD 07/25/24 [phone]

## 2024-08-06 ENCOUNTER — Telehealth: Payer: Self-pay

## 2024-08-06 NOTE — Patient Outreach (Signed)
 Complex Care Management Care Guide Note  08/06/2024 Name: KEYLOR RANDS MRN: [id number] DOB: 03/31/1986  Maxwell Faulkner is a 38 y.o. year old male who is a primary care patient of Lendia Boby LITTIE, NP-C and is actively engaged with the care management team. I reached out to Maxwell Faulkner by phone today to assist with re-scheduling  with the RN Case Manager.  Follow up plan: Telephone appointment with complex care management team member scheduled for:  08/28/24  Shereen Gin [facility]  Population Health VBCI Assistant Direct Dial: [phone]  Fax: [phone] Website: [email or web address]

## 2024-08-12 ENCOUNTER — Ambulatory Visit (INDEPENDENT_AMBULATORY_CARE_PROVIDER_SITE_OTHER)

## 2024-08-12 ENCOUNTER — Encounter: Payer: Self-pay | Admitting: Family Medicine

## 2024-08-12 ENCOUNTER — Encounter (HOSPITAL_COMMUNITY): Payer: Self-pay

## 2024-08-12 DIAGNOSIS — F411 Generalized anxiety disorder: Secondary | ICD-10-CM

## 2024-08-12 DIAGNOSIS — F1220 Cannabis dependence, uncomplicated: Secondary | ICD-10-CM

## 2024-08-12 DIAGNOSIS — F331 Major depressive disorder, recurrent, moderate: Secondary | ICD-10-CM

## 2024-08-12 NOTE — Telephone Encounter (Signed)
Called and scheduled appt for pt 

## 2024-08-12 NOTE — Progress Notes (Signed)
 Comprehensive Clinical Assessment (CCA) Note  08/12/2024 Maxwell Faulkner [id number]  Chief Complaint:  Chief Complaint  Patient presents with   Depression   Anxiety   Visit Diagnosis: Generalized Anxiety Disorder, Major Depressive Disorder, Cannabis Use Disorder, Severe.   CCA Screening, Triage and Referral (STR)  Patient Reported Information How did you hear about us ? Other (Comment)  Referral name: No data recorded Referral phone number: No data recorded  Whom do you see for routine medical problems? Primary Care  Practice/Facility Name: [facility]  Practice/Facility Phone Number: No data recorded Name of Contact: No data recorded Contact Number: No data recorded Contact Fax Number: No data recorded Prescriber Name: No data recorded Prescriber Address (if known): No data recorded  What Is the Reason for Your Visit/Call Today? Pt presents to the walk in clinic unaccompanied and says that people are out to get me.  He has been seen by Dr. Marolyn Freed and when Dr. Freed transitioned to his position at [facility] [employer], [name] no showed for his psychiatric appointment. He carries the the following dx:  Generalized Anxiety Disorder and Major Depressive Disorder.  He says after Dr. Freed left, he says he stopped taking his medications.   He says he felt Dr. Ernie really helped him.   Maxwell Faulkner says he is depressed currently because his baby moma for his daughter has not let him see his daughter. He explains his daughter was born in 2012 and he does not get to see her.  Maxwell Faulkner says he would hurt the baby moma except for he does not want to go back to prison.    Maxwell Faulkner says he has a history of depression and going back to his teenage years. He says he stayed in a lot of trouble.  He says he has been in and out of prison a good deal of his life. Maxwell Faulkner says he grew up in [place]. He denies any hx of abuse. He says he lived on the streets mostly.   Maxwell Faulkner says his oldest son does not get into  trouble.  How Long Has This Been Causing You Problems? > than 6 months  What Do You Feel Would Help You the Most Today? Treatment for Depression or other mood problem   Have You Recently Been in Any Inpatient Treatment (Hospital/Detox/Crisis Center/28-Day Program)? No  Name/Location of Program/Hospital:No data recorded How Long Were You There? No data recorded When Were You Discharged? No data recorded  Have You Ever Received Services From [facility] Before? Yes  Who Do You See at [facility]? No data recorded  Have You Recently Had Any Thoughts About Hurting Yourself? No  Are You Planning to Commit Suicide/Harm Yourself At This time? No   Have you Recently Had Thoughts About Hurting Someone [name]? No data recorded Explanation: No data recorded  Have You Used Any Alcohol or Drugs in the Past 24 Hours? Yes  How Long Ago Did You Use Drugs or Alcohol? No data recorded What Did You Use and How Much? 2-3 blunts   Do You Currently Have a Therapist/Psychiatrist? No  Name of Therapist/Psychiatrist: No data recorded  Have You Been Recently Discharged From Any Office Practice or Programs? No  Explanation of Discharge From Practice/Program: No data recorded    CCA Screening Triage Referral Assessment Type of Contact: Face-to-Face  Is this Initial or Reassessment? No data recorded Date Telepsych consult ordered in [facility]:  No data recorded Time Telepsych consult ordered in [facility]:  No data recorded  Patient Reported Information Reviewed?  No data recorded Patient Left Without Being Seen? No data recorded Reason for Not Completing Assessment: No data recorded  Collateral Involvement: No data recorded  Does Patient Have a Court Appointed Legal Guardian? No data recorded Name and Contact of Legal Guardian: No data recorded If Minor and Not Living with Parent(s), Who has Custody? No data recorded Is CPS involved or ever been involved? Never  Is APS involved or ever been involved?  Never   Patient Determined To Be At Risk for Harm To Self or Others Based on Review of Patient Reported Information or Presenting Complaint? No  Method: No Plan  Availability of Means: No access or NA  Intent: Vague intent or NA  Notification Required: No need or identified person  Additional Information for Danger to Others Potential: No data recorded Additional Comments for Danger to Others Potential: No data recorded Are There Guns or Other Weapons in Your Home? No  Types of Guns/Weapons: No data recorded Are These Weapons Safely Secured?                            No data recorded Who Could Verify You Are Able To Have These Secured: No data recorded Do You Have any Outstanding Charges, Pending Court Dates, Parole/Probation? none. released from prision in 2018 and has had no charges since then  Contacted To Inform of Risk of Harm To Self or Others: No data recorded  Location of Assessment: [facility]   Does Patient Present under Involuntary Commitment? No  IVC Papers Initial File Date: No data recorded  [place] of Residence: Maxwell Faulkner   Patient Currently Receiving the Following Services: Individual Therapy; Medication Management   Determination of Need: Routine (7 days)   Options For Referral: Outpatient Therapy     CCA Biopsychosocial Intake/Chief Complaint:  No data recorded Current Symptoms/Problems: No data recorded  Patient Reported Schizophrenia/Schizoaffective Diagnosis in Past: No data recorded  Strengths: No data recorded Preferences: No data recorded Abilities: No data recorded  Type of Services Patient Feels are Needed: No data recorded  Initial Clinical Notes/Concerns: No data recorded  Mental Health Symptoms Depression: PHQ-9 is 12  Duration of Depressive symptoms: No data recorded  Mania:  none  Anxiety:  GAD-7 is 13  Psychosis:  none  Duration of Psychotic symptoms: No data recorded  Trauma:  none  Obsessions:  none   Compulsions:none  Inattention: none  Hyperactivity/Impulsivity:  none  Oppositional/Defiant Behaviors:  none  Emotional Irregularity:none  Other Mood/Personality Symptoms:  none   Mental Status Exam Appearance and self-care  Stature: Average  Weight:  Average  Clothing:  Average  Grooming:  Casual  Cosmetic use:  None  Posture/gait:  normal  Motor activity:  within normal limits  Sensorium  Attention: focused  Concentration:  good  Orientation:  x5  Recall/memory:  No data recorded  Affect and Mood  Affect:  full range  Mood:  depressed  Relating  Eye contact: good  Facial expression:  No data recorded  Attitude toward examiner:  cooperative  Thought and Language  Speech flow: within normal limits  Thought content: within normal limits  Preoccupation: none      [employer] of Knowledge:  average  Intelligence:  average  Abstraction:    Judgement:  fair  Reality Testing:  good  Insight:  fair  Decision Making:  No data recorded  Social Functioning  Social Maturity:  No data recorded  Social  Judgement:  No data recorded  Stress  Stressors:  No data recorded  Coping Ability: overwhelmed  Skill Deficits:  none identified  Supports:  no supports identifies    Religion: yes    Leisure/Recreation:none currently    Exercise/Diet: normal     CCA Employment/Education Employment/Work Situation: Employment / Work Situation Employment Situation: Unemployed Patient's Job has Been Impacted by Current Illness: Yes Describe how Patient's Job has Been Impacted: has seizures. is applying for dissability Has Patient ever Been in the Military?: No  Education: Education Is Patient Currently Attending School?: No Last Grade Completed: 11 Did You Graduate From [employer]?: No Did You Have An Individualized Education Program (IIEP): No Did You Have Any Difficulty At School?: No Patient's Education Has Been Impacted by Current Illness: No   CCA  Family/Childhood History Family and Relationship History: Family history Marital status: Single  Childhood History:  Childhood History By whom was/is the patient raised?: Mother Additional childhood history information: father was never involved in his life Description of patient's relationship with caregiver when they were a child: good relationship with mother Patient's description of current relationship with people who raised him/her: good How were you disciplined when you got in trouble as a child/adolescent?: never was disciplined. He says that may have been part of his problem of misbehaving. Does patient have siblings?: Yes Number of Siblings: 2 Description of patient's current relationship with siblings: has one sister and one brother. no current relationship Did patient suffer any verbal/emotional/physical/sexual abuse as a child?: No Did patient suffer from severe childhood neglect?: No Was the patient ever a victim of a crime or a disaster?: No Witnessed domestic violence?: No Has patient been affected by domestic violence as an adult?: No  Child/Adolescent Assessment:     CCA Substance Use Alcohol/Drug Use: Alcohol / Drug Use Pain Medications: none Prescriptions: none Over the Counter: none History of alcohol / drug use?: Yes Longest period of sobriety (when/how long): 4 years when I was locked up Negative Consequences of Use: Legal Withdrawal Symptoms: None Substance #1 Name of Substance 1: cannabis 1 - Age of First Use: 11 1 - Amount (size/oz): 2-3 blunts per day 1 - Frequency: daily 1 - Duration: since age 49 1 - Last Use / Amount: yesterday 1 - Method of Aquiring: illict 1- Route of Use: smoking                       ASAM's:  Six Dimensions of Multidimensional Assessment  Dimension 1:  Acute Intoxication and/or Withdrawal Potential:   Dimension 1:  Description of individual's past and current experiences of substance use and withdrawal: none   Dimension 2:  Biomedical Conditions and Complications:   Dimension 2:  Description of patient's biomedical conditions and  complications: seizures  Dimension 3:  Emotional, Behavioral, or Cognitive Conditions and Complications:  Dimension 3:  Description of emotional, behavioral, or cognitive conditions and complications: depresion, anxiety  Dimension 4:  Readiness to Change:     Dimension 5:  Relapse, Continued use, or Continued Problem Potential:     Dimension 6:  Recovery/Living Environment:  Dimension 6:  Recovery/Iiving environment criteria description: lives with mom. No one in the house smokes. Mom is against it. but Step father told mom it helps with the seizures.  ASAM Severity Score:    ASAM Recommended Level of Treatment: ASAM Recommended Level of Treatment: Level II Intensive Outpatient Treatment   Substance use Disorder (SUD) Substance Use Disorder (SUD)  Checklist Symptoms of Substance Use: Continued use despite having a persistent/recurrent physical/psychological problem caused/exacerbated by use, Continued use despite persistent or recurrent social, interpersonal problems, caused or exacerbated by use, Evidence of tolerance, Presence of craving or strong urge to use, Persistent desire or unsuccessful efforts to cut down or control use  Recommendations for Services/Supports/Treatments: Recommendations for Services/Supports/Treatments Recommendations For Services/Supports/Treatments: Individual Therapy  DSM5 Diagnoses: Patient Active Problem List   Diagnosis Date Noted   Elevated troponin 01/25/2024   Chest pain, atypical 01/25/2024   Pancreatitis 01/17/2024   Seizures (HCC) 01/17/2024   Acute pancreatitis 01/17/2024   Abnormal CT scan, gallbladder 01/11/2024   BRBPR (bright red blood per rectum) 01/11/2024   Right upper quadrant abdominal tenderness 01/11/2024   History of colitis 01/11/2024   Aortic atherosclerosis 01/11/2024   Anemia 01/11/2024   Right lower quadrant  abdominal tenderness without rebound tenderness 01/11/2024   Generalized abdominal pain 01/11/2024   Pulmonary emphysema (HCC) 01/11/2024   Thought pt meets ASAM critieria for level II, SA IOP he is not interested in this level of service, therefore he is referred for individual therapy. Patient Centered Plan: Patient is on the following Treatment Plan(s):   Problem: Anxiety  Dates: Start:  08/12/24    Disciplines: Interdisciplinary, PROVIDER  Goal: Arby will decrease his anxiety and depression by rating them as no higher than a 4 on the GAD-7 and PHQ-9 respectively.  Dates: Start:  08/12/24   Expected End:  02/10/25    Disciplines: Interdisciplinary, PROVIDER  Goal: Amay will move from the precontemplation stage of change to the contemplation stage of change regarding his cannabis use.  Dates: Start:  08/12/24    Disciplines: Interdisciplinary, PROVIDER  Intervention: Therapist will assist Andrew in identifying thoughts and behaviors that can contribute to feelings of anxiety and depression.  Dates: Start:  08/12/24    Intervention: Therapist will assist Tashi is moving from the pre-contemplation stage of change to the contemplation stage of change through motivational interviewing and psycho-education.  Dates: Start:  08/12/24    Description: Lamar give this therapist verbal permission to electronically sign his Care Plan    Referrals to Alternative Service(s): Referred to Alternative Service(s):   Place:   Date:   Time:    Referred to Alternative Service(s):   Place:   Date:   Time:    Referred to Alternative Service(s):   Place:   Date:   Time:    Referred to Alternative Service(s):   Place:   Date:   Time:      Collaboration of Care: n/a  Patient/Guardian was advised Release of Information must be obtained prior to any record release in order to collaborate their care with an outside provider. Patient/Guardian was advised if they have not already done so to contact the  registration department to sign all necessary forms in order for us  to release information regarding their care.   Consent: Patient/Guardian gives verbal consent for treatment and assignment of benefits for services provided during this visit. Patient/Guardian expressed understanding and agreed to proceed.    Return on August 28, 2024 at 3pm  Darice Simpler, MS, LMFT, LCAS

## 2024-08-20 ENCOUNTER — Ambulatory Visit (INDEPENDENT_AMBULATORY_CARE_PROVIDER_SITE_OTHER)

## 2024-08-20 ENCOUNTER — Other Ambulatory Visit: Payer: Self-pay | Admitting: Family Medicine

## 2024-08-20 ENCOUNTER — Encounter: Payer: Self-pay | Admitting: Family Medicine

## 2024-08-20 ENCOUNTER — Ambulatory Visit: Payer: Self-pay | Admitting: Family Medicine

## 2024-08-20 ENCOUNTER — Ambulatory Visit: Admitting: Family Medicine

## 2024-08-20 VITALS — BP 114/78 | HR 72 | Temp 97.8°F | Ht 66.0 in | Wt 107.0 lb

## 2024-08-20 DIAGNOSIS — R058 Other specified cough: Secondary | ICD-10-CM

## 2024-08-20 DIAGNOSIS — R5383 Other fatigue: Secondary | ICD-10-CM

## 2024-08-20 DIAGNOSIS — R079 Chest pain, unspecified: Secondary | ICD-10-CM | POA: Diagnosis not present

## 2024-08-20 DIAGNOSIS — J439 Emphysema, unspecified: Secondary | ICD-10-CM | POA: Diagnosis not present

## 2024-08-20 DIAGNOSIS — R61 Generalized hyperhidrosis: Secondary | ICD-10-CM | POA: Diagnosis not present

## 2024-08-20 DIAGNOSIS — K625 Hemorrhage of anus and rectum: Secondary | ICD-10-CM

## 2024-08-20 DIAGNOSIS — R569 Unspecified convulsions: Secondary | ICD-10-CM | POA: Diagnosis not present

## 2024-08-20 DIAGNOSIS — R0789 Other chest pain: Secondary | ICD-10-CM | POA: Diagnosis not present

## 2024-08-20 DIAGNOSIS — M546 Pain in thoracic spine: Secondary | ICD-10-CM | POA: Diagnosis not present

## 2024-08-20 DIAGNOSIS — M791 Myalgia, unspecified site: Secondary | ICD-10-CM | POA: Diagnosis not present

## 2024-08-20 DIAGNOSIS — I700 Atherosclerosis of aorta: Secondary | ICD-10-CM | POA: Diagnosis not present

## 2024-08-20 DIAGNOSIS — Z8719 Personal history of other diseases of the digestive system: Secondary | ICD-10-CM

## 2024-08-20 DIAGNOSIS — D649 Anemia, unspecified: Secondary | ICD-10-CM

## 2024-08-20 LAB — CBC WITH DIFFERENTIAL/PLATELET
Basophils Absolute: 0 K/uL (ref 0.0–0.1)
Basophils Relative: 0.6 % (ref 0.0–3.0)
Eosinophils Absolute: 0.1 K/uL (ref 0.0–0.7)
Eosinophils Relative: 2 % (ref 0.0–5.0)
HCT: 35.6 % — ABNORMAL LOW (ref 39.0–52.0)
Hemoglobin: 11.9 g/dL — ABNORMAL LOW (ref 13.0–17.0)
Lymphocytes Relative: 52.9 % — ABNORMAL HIGH (ref 12.0–46.0)
Lymphs Abs: 3.5 K/uL (ref 0.7–4.0)
MCHC: 33.3 g/dL (ref 30.0–36.0)
MCV: 92.8 fl (ref 78.0–100.0)
Monocytes Absolute: 0.3 K/uL (ref 0.1–1.0)
Monocytes Relative: 5 % (ref 3.0–12.0)
Neutro Abs: 2.6 K/uL (ref 1.4–7.7)
Neutrophils Relative %: 39.5 % — ABNORMAL LOW (ref 43.0–77.0)
Platelets: 202 K/uL (ref 150.0–400.0)
RBC: 3.84 Mil/uL — ABNORMAL LOW (ref 4.22–5.81)
RDW: 14.4 % (ref 11.5–15.5)
WBC: 6.6 K/uL (ref 4.0–10.5)

## 2024-08-20 LAB — FOLATE: Folate: 7.1 ng/mL (ref >5.9)

## 2024-08-20 LAB — TROPONIN I (HIGH SENSITIVITY): High Sens Troponin I: 6 ng/L (ref 2–17)

## 2024-08-20 LAB — COMPREHENSIVE METABOLIC PANEL WITH GFR
ALT: 7 U/L (ref 0–53)
AST: 12 U/L (ref 0–37)
Albumin: 4.1 g/dL (ref 3.5–5.2)
Alkaline Phosphatase: 54 U/L (ref 39–117)
BUN: 7 mg/dL (ref 6–23)
CO2: 30 meq/L (ref 19–32)
Calcium: 9.1 mg/dL (ref 8.4–10.5)
Chloride: 106 meq/L (ref 96–112)
Creatinine, Ser: 0.85 mg/dL (ref 0.40–1.50)
GFR: 109.23 mL/min (ref >60.00)
Glucose, Bld: 79 mg/dL (ref 70–99)
Potassium: 3.7 meq/L (ref 3.5–5.1)
Sodium: 142 meq/L (ref 135–145)
Total Bilirubin: 0.4 mg/dL (ref 0.2–1.2)
Total Protein: 6.8 g/dL (ref 6.0–8.3)

## 2024-08-20 LAB — VITAMIN B12: Vitamin B-12: 435 pg/mL (ref 211–911)

## 2024-08-20 LAB — FERRITIN: Ferritin: 38.5 ng/mL (ref 22.0–322.0)

## 2024-08-20 LAB — MAGNESIUM: Magnesium: 1.8 mg/dL (ref 1.5–2.5)

## 2024-08-20 LAB — CK: Total CK: 73 U/L (ref 17–232)

## 2024-08-20 LAB — TSH: TSH: 0.84 u[IU]/mL (ref 0.35–5.50)

## 2024-08-20 MED ORDER — PANTOPRAZOLE SODIUM 40 MG PO TBEC: 40.0000 mg | DELAYED_RELEASE_TABLET | Freq: Every day | ORAL | 1 refills | Status: DC

## 2024-08-20 NOTE — Progress Notes (Signed)
 Please let him know that his chest X ray shows severe emphysema and worsening bullous changes of his lungs which means bubble- like air spaces at the top of his lungs. Most likely his chest pain and fatigue are due to this condition. If not treated, he can develop heart or lung failure.  I encourage him to stop smoking marijuana and cigarettes. I am referring him to a lung specialist and they will call him to schedule.

## 2024-08-20 NOTE — Patient Instructions (Addendum)
 Please go downstairs for labs and chest x-ray before you leave.  I will be in touch with your results and with recommendations.  If your pain becomes more severe or if you have any worsening symptoms such as fever, shortness of breath, vomiting or large amounts of blood from your rectum, you will need to go to the emergency department or call 911.  [facility] will call you to schedule.

## 2024-08-20 NOTE — Progress Notes (Signed)
 Subjective:     Patient ID: Maxwell Faulkner, male    DOB: 1986/07/30, 38 y.o.   MRN: [id number]  Chief Complaint  Patient presents with   Abdominal Pain    Lot of pain, stomach and head pain. Wake up with sweats. Has been going on for months     Abdominal Pain Associated symptoms include myalgias. Pertinent negatives include no constipation, diarrhea, dysuria, fever, frequency, headaches, nausea or vomiting.    Discussed the use of AI scribe software for clinical note transcription with the patient, who gave verbal consent to proceed.  History of Present Illness Maxwell Faulkner is a 38 year old male with COPD who presents with chest pain and cough.  Chest pain - Continuous sharp pain localized to the mid-chest for the past several days - Pain worsened by movement - No worsening with deep breathing  Cough and sputum production - Chronic cough with sputum production for several months - No hemoptysis  Night sweats - Wakes up drenched in sweat with a wet bed   Back pain - Severe pain along the spine for one to two weeks   Seizure disorder - History of seizures - Last seizure episode two months ago - Currently taking 200 mg of medication under neurologist care  Rectal bleeding - Bright red blood during bowel movements - possible hemorrhoid  - No prior colonoscopy or gastroenterology follow-up  Substance use - Smokes one pack of cigarettes every two to three days - Marijuana use, 3 times daily  - No alcohol use  Gastrointestinal symptoms - No vomiting or diarrhea    Health Maintenance Due  Topic Date Due   Pneumococcal Vaccine (1 of 2 - PCV) Never done   Hepatitis B Vaccines 19-59 Average Risk (1 of 3 - 19+ 3-dose series) Never done   HPV VACCINES (1 - 3-dose SCDM series) Never done    Past Medical History:  Diagnosis Date   Acute pancreatitis 01/17/2024   Anemia    Anxiety    Aortic atherosclerosis 01/11/2024   Depression    History of colitis  01/11/2024   Hypertension    Pulmonary emphysema (HCC) 01/11/2024   Seizures (HCC)     Past Surgical History:  Procedure Laterality Date   WISDOM TOOTH EXTRACTION      Family History  Problem Relation Age of Onset   Migraines Mother    Hypertension Mother     Social History   Socioeconomic History   Marital status: Significant Other    Spouse name: sierra whitsett   Number of children: 3   Years of education: Not on file   Highest education level: Not on file  Occupational History   Not on file  Tobacco Use   Smoking status: Every Day    Current packs/day: 1.00    Types: Cigarettes   Smokeless tobacco: Never  Vaping Use   Vaping status: Never Used  Substance and Sexual Activity   Alcohol use: Not Currently    Comment: stopped drinking   Drug use: Not Currently    Types: Marijuana    Comment: last use 2 weeks ago   Sexual activity: Yes    Birth control/protection: None  Other Topics Concern   Not on file  Social History Narrative   Right handed   Caffeine 1 cup daily   Social Drivers of Health   Financial Resource Strain: Not on file  Food Insecurity: No Food Insecurity (01/25/2024)   Hunger Vital Sign  Worried About Programme researcher, broadcasting/film/video in the Last Year: Never true    Ran Out of Food in the Last Year: Never true  Transportation Needs: No Transportation Needs (01/25/2024)   PRAPARE - Administrator, Civil Service (Medical): No    Lack of Transportation (Non-Medical): No  Physical Activity: Not on file  Stress: Not on file  Social Connections: Patient Declined (01/25/2024)   Social Connection and Isolation Panel    Frequency of Communication with Friends and Family: Patient declined    Frequency of Social Gatherings with Friends and Family: Patient declined    Attends Religious Services: Patient declined    Database administrator or Organizations: Patient declined    Attends Banker Meetings: Patient declined    Marital Status:  Patient declined  Intimate Partner Violence: Not At Risk (01/25/2024)   Humiliation, Afraid, Rape, and Kick questionnaire    Fear of Current or Ex-Partner: No    Emotionally Abused: No    Physically Abused: No    Sexually Abused: No    Outpatient Medications Prior to Visit  Medication Sig Dispense Refill   lacosamide  (VIMPAT ) 200 MG TABS tablet Take 1 tablet (200 mg total) by mouth 2 (two) times daily. 180 tablet 3   sertraline  (ZOLOFT ) 100 MG tablet Take 1 tablet (100 mg total) by mouth daily. 30 tablet 2   busPIRone  (BUSPAR ) 10 MG tablet Take 1 tablet (10 mg total) by mouth 2 (two) times daily. (Patient not taking: Reported on 08/20/2024) 60 tablet 2   diazePAM , 15 MG Dose, (VALTOCO  15 MG DOSE) 2 x 7.5 MG/0.1ML LQPK Place 15 mg into the nose as needed (For seizure lasting more than 2 minutes). (Patient not taking: Reported on 08/20/2024) 5 each 1   Diclofenac  Sodium CR 100 MG 24 hr tablet Take 1 tablet (100 mg total) by mouth daily. (Patient not taking: Reported on 08/20/2024) 10 tablet 0   famotidine  (PEPCID ) 20 MG tablet Take 1 tablet (20 mg total) by mouth 2 (two) times daily. (Patient not taking: Reported on 08/20/2024) 60 tablet 0   lidocaine  (LIDODERM ) 5 % Place 1 patch onto the skin daily. Remove & Discard patch within 12 hours or as directed by MD (Patient not taking: Reported on 08/20/2024) 30 patch 0   ondansetron  (ZOFRAN -ODT) 4 MG disintegrating tablet Take 1 tablet (4 mg total) by mouth every 8 (eight) hours as needed for nausea or vomiting. (Patient not taking: Reported on 08/20/2024) 20 tablet 0   QUEtiapine  (SEROQUEL ) 50 MG tablet Take 1 tablet (50 mg total) by mouth at bedtime. (Patient not taking: Reported on 08/20/2024) 90 tablet 0   HYDROcodone -acetaminophen  (NORCO/VICODIN) 5-325 MG tablet Take 1 tablet by mouth every 6 (six) hours as needed. 5 tablet 0   No facility-administered medications prior to visit.    Allergies  Allergen Reactions   Depakote  [Divalproex  Sodium]  Other (See Comments)    Pancreatitis 12/2023    Review of Systems  Constitutional:  Positive for diaphoresis and malaise/fatigue. Negative for chills and fever.  Respiratory:  Positive for cough and sputum production. Negative for shortness of breath.   Cardiovascular:  Positive for chest pain. Negative for palpitations and leg swelling.  Gastrointestinal:  Positive for abdominal pain and blood in stool. Negative for constipation, diarrhea, nausea and vomiting.  Genitourinary:  Negative for dysuria, frequency and urgency.  Musculoskeletal:  Positive for back pain, joint pain and myalgias.  Neurological:  Negative for dizziness, focal weakness and headaches.  Objective:    Physical Exam Constitutional:      General: He is not in acute distress.    Appearance: He is ill-appearing.     Comments: thin  HENT:     Mouth/Throat:     Mouth: Mucous membranes are moist.     Pharynx: Oropharynx is clear.  Eyes:     Extraocular Movements: Extraocular movements intact.     Conjunctiva/sclera: Conjunctivae normal.  Cardiovascular:     Rate and Rhythm: Normal rate and regular rhythm.  Pulmonary:     Effort: Pulmonary effort is normal.     Comments: Decreased lower lobes Chest:     Chest wall: Tenderness present.  Abdominal:     General: There is no distension.     Tenderness: There is abdominal tenderness.  Musculoskeletal:     Cervical back: Normal range of motion and neck supple.  Skin:    General: Skin is warm and dry.  Neurological:     General: No focal deficit present.     Mental Status: He is alert and oriented to person, place, and time.     Cranial Nerves: No cranial nerve deficit.     Motor: No weakness.  Psychiatric:        Mood and Affect: Mood normal.        Behavior: Behavior normal.        Thought Content: Thought content normal.      BP 114/78   Pulse 72   Temp 97.8 F (36.6 C) (Temporal)   Ht 5' 6 (1.676 m)   Wt 107 lb (48.5 kg)   SpO2 99%   BMI  17.27 kg/m  Wt Readings from Last 3 Encounters:  08/20/24 107 lb (48.5 kg)  03/21/24 106 lb 9.6 oz (48.4 kg)  02/14/24 109 lb (49.4 kg)       Assessment & Plan:   Problem List Items Addressed This Visit     Anemia   Relevant Orders   CBC with Differential/Platelet (Completed)   Comprehensive metabolic panel with GFR (Completed)   Ferritin (Completed)   Ambulatory referral to Gastroenterology   Aortic atherosclerosis   BRBPR (bright red blood per rectum)   Relevant Orders   CBC with Differential/Platelet (Completed)   Ferritin (Completed)   Folate (Completed)   Vitamin B12 (Completed)   Ambulatory referral to Gastroenterology   History of colitis   Relevant Orders   Ambulatory referral to Gastroenterology   Pulmonary emphysema [facility])   Relevant Orders   Ambulatory referral to Pulmonology   Seizures (HCC)   Other Visit Diagnoses       Chest pain, unspecified type    -  Primary   Relevant Orders   CBC with Differential/Platelet (Completed)   Comprehensive metabolic panel with GFR (Completed)   DG Chest 2 View (Completed)   Troponin I (High Sensitivity) (Completed)   D-dimer, quantitative   EKG 12-Lead (Completed)   Ambulatory referral to Pulmonology     Productive cough       Relevant Orders   CBC with Differential/Platelet (Completed)   Comprehensive metabolic panel with GFR (Completed)   DG Chest 2 View (Completed)   D-dimer, quantitative     Bilateral thoracic back pain, unspecified chronicity         Chest wall tenderness       Relevant Orders   DG Chest 2 View (Completed)     Myalgia       Relevant Orders   TSH (Completed)   Magnesium  (  Completed)   CK (Creatine Kinase) (Completed)     Fatigue, unspecified type       Relevant Orders   CBC with Differential/Platelet (Completed)   Comprehensive metabolic panel with GFR (Completed)   Ferritin (Completed)   Folate (Completed)   Vitamin B12 (Completed)   TSH (Completed)     Night sweats       Relevant  Orders   HIV Antibody (routine testing w rflx)     History of pancreatitis       Relevant Orders   Ambulatory referral to Gastroenterology     Lung disease, bullous (HCC)       Relevant Orders   Ambulatory referral to Pulmonology       Assessment and Plan Assessment & Plan Chest pain with musculoskeletal features Intermittent sharp chest pain, continuous, located in the mid-chest, exacerbated by movement and tender to palpation, suggesting a musculoskeletal origin. Differential includes cardiac and pulmonary causes, but EKG shows normal sinus rhythm with sinus arrhythmia and high voltage. ?LVH.   Absence of pain on deep breathing - Order chest x-ray - Conduct blood work - Try Protonix    Rectal bleeding due to large external hemorrhoid Significant rectal bleeding with bowel movements.  Urgent evaluation by gastroenterology is necessary due to the volume of bleeding. - Place urgent referral to gastroenterology  Chronic seizure disorder Recent seizure activity, including a witnessed seizure two months ago and possible nocturnal seizures. Currently managed with Lacosamide  200 mg. Follow-up with neurologist Dr. Gregg   Chronic obstructive pulmonary disease (COPD) Chronic cough with sputum production. History of smoking one pack of cigarettes every two to three days and marijuana use. Ongoing respiratory symptoms warrant monitoring. - chest X ray ordered   Acute pancreatitis Ongoing abdominal symptoms. No recent follow-up with gastroenterology since initial referral. History of pancreatitis and colitis.   - Encourage follow-up with gastroenterology     I have discontinued Cam L. Firebaugh's HYDROcodone -acetaminophen . I am also having him start on pantoprazole . Additionally, I am having him maintain his ondansetron , famotidine , lacosamide , Valtoco  15 MG Dose, sertraline , QUEtiapine , busPIRone , Diclofenac  Sodium CR, and lidocaine .  Meds ordered this encounter  Medications    pantoprazole  (PROTONIX ) 40 MG tablet    Sig: Take 1 tablet (40 mg total) by mouth daily.    Dispense:  30 tablet    Refill:  1    Supervising Provider:   ROLLENE NORRIS A [[id number]]

## 2024-08-21 LAB — HIV ANTIBODY (ROUTINE TESTING W REFLEX)
HIV 1&2 Ab, 4th Generation: NONREACTIVE
HIV FINAL INTERPRETATION: NEGATIVE

## 2024-08-21 LAB — D-DIMER, QUANTITATIVE: D-Dimer, Quant: <0.19 ug{FEU}/mL (ref <0.50)

## 2024-08-22 ENCOUNTER — Other Ambulatory Visit: Payer: Self-pay | Admitting: Neurology

## 2024-08-22 ENCOUNTER — Other Ambulatory Visit: Payer: Self-pay

## 2024-08-25 ENCOUNTER — Other Ambulatory Visit: Payer: Self-pay | Admitting: Neurology

## 2024-08-25 NOTE — Telephone Encounter (Signed)
 Requested Prescriptions   Pending Prescriptions Disp Refills   lacosamide  (VIMPAT ) 200 MG TABS tablet [Pharmacy Med Name: LACOSAMIDE  200MG  TABLETS] 180 tablet     Sig: TAKE 1 TABLET(200 MG) BY MOUTH TWICE DAILY   Last seen 02/14/24 Next appt 09/01/24  Dispenses   Dispensed Days Supply Quantity Provider Pharmacy  LACOSAMIDE  200MG  TABLETS 07/26/2024 30 60 each Gregg Lek, MD [facility] DR[facility] STORE #...  LACOSAMIDE  200MG  TABLETS 06/25/2024 30 60 each Gregg Lek, MD [facility] DRUG STORE #...  LACOSAMIDE  200MG  TABLETS 05/24/2024 30 60 each Gregg Lek, MD [facility]ORE #...  LACOSAMIDE  200MG  TABLETS 04/24/2024 30 60 each Gregg Lek, MD [facility] ST[facility] #...  LACOSAMIDE  200MG  TABLETS 03/22/2024 30 60 each Gregg Lek, MD [facility] DR[facility] STORE #...  LACOSAMIDE  200MG  TABLETS 02/22/2024 30 60 each Gregg Lek, MD [facility] DRUG STORE #...  lacosamide  (VIMPAT ) 200 MG TABS tablet 01/23/2024 30 60 tablet Vernon Ranks, MD [facility][email or web address]

## 2024-08-28 ENCOUNTER — Ambulatory Visit (HOSPITAL_COMMUNITY)

## 2024-08-28 ENCOUNTER — Other Ambulatory Visit: Payer: Self-pay | Admitting: *Deleted

## 2024-08-28 ENCOUNTER — Encounter: Payer: Self-pay | Admitting: *Deleted

## 2024-08-28 DIAGNOSIS — R569 Unspecified convulsions: Secondary | ICD-10-CM

## 2024-08-28 NOTE — Patient Instructions (Signed)
 [name] - I am sorry I was unable to reach you today for our scheduled appointment. I work with Lendia, [name], NP-C and am calling to support your healthcare needs. Please contact me at [phone] at your earliest convenience. I look forward to speaking with you soon.   Thank you,  Rosina Forte, BSN RN [facility], [facility] RN Care Manager Direct Dial: [phone]  Fax: [phone]

## 2024-08-28 NOTE — Patient Outreach (Addendum)
 Complex Care Management   Visit Note  08/28/2024  Name:  Maxwell Faulkner MRN: [id number] DOB: 12-11-1985  Situation: Referral received for Complex Care Management related to Pulmonary Emphysema/Seizures - MM referral I obtained verbal consent from Patient.  Visit completed with Patient  on the phone   Patient reported that he currently resides with his adult son (39 years old) and his mother. Due to his seizure activity, they alternate monitoring him around the clock to ensure his safety. He expressed interest in obtaining a Life Alert system but is experiencing financial difficulties that have made this challenging. He also shared that someone recommended exploring the option of a service dog to assist with his epilepsy. RNCM discussed Medicaid-funded PCS/CAP services with the patient to determine potential eligibility for in-home support. These services could help reduce caregiver strain on his mother and son.   The patient declined to enroll in the program with RNCM at this time, stating that he currently only wishes to work with a Child Psychotherapist (BSW/LCSW).  Referrals have been placed and appointments scheduled. The patient has been informed of the dates and times.  Background:   Past Medical History:  Diagnosis Date   Acute pancreatitis 01/17/2024   Anemia    Anxiety    Aortic atherosclerosis 01/11/2024   Depression    History of colitis 01/11/2024   Hypertension    Pulmonary emphysema (HCC) 01/11/2024   Seizures (HCC)     Assessment: Patient Reported Symptoms:  Cognitive Cognitive Status: No symptoms reported Cognitive/Intellectual Conditions Management [RPT]: None reported or documented in medical history or problem list   Health Maintenance Behaviors: Annual physical exam Healing Pattern: Average Health Facilitated by: Rest  Neurological Neurological Review of Symptoms: Headaches Neurological Management Strategies: Routine screening Neurological Self-Management Outcome:  3 (uncertain)  HEENT HEENT Symptoms Reported: Sore throat, Tearing HEENT Self-Management Outcome: 3 (uncertain)    Cardiovascular Cardiovascular Symptoms Reported: Dizziness Does patient have uncontrolled Hypertension?: No Cardiovascular Self-Management Outcome: 3 (uncertain)  Respiratory Respiratory Symptoms Reported: Shortness of breath Respiratory Management Strategies: Coping strategies Respiratory Self-Management Outcome: 3 (uncertain)  Endocrine Endocrine Symptoms Reported: No symptoms reported Is patient diabetic?: No Endocrine Self-Management Outcome: 4 (good)  Gastrointestinal Gastrointestinal Symptoms Reported: Bleeding, Change in appetite Gastrointestinal Self-Management Outcome: 3 (uncertain)    Genitourinary Genitourinary Symptoms Reported: No symptoms reported    Integumentary Integumentary Symptoms Reported: No symptoms reported    Musculoskeletal Musculoskelatal Symptoms Reviewed: Unsteady gait, Muscle pain Musculoskeletal Self-Management Outcome: 3 (uncertain) Falls in the past year?: Yes Number of falls in past year: 2 or more Was there an injury with Fall?: Yes Fall Risk Category Calculator: 3 Patient Fall Risk Level: High Fall Risk Patient at Risk for Falls Due to: History of fall(s), Impaired balance/gait Fall risk Follow up: Falls evaluation completed, Education provided  Psychosocial Psychosocial Symptoms Reported: Depression - if selected complete PHQ 2-9, Sadness - if selected complete PHQ 2-9, Anxiety - if selected complete GAD Behavioral Management Strategies: Coping strategies Behavioral Health Self-Management Outcome: 3 (uncertain) Major Change/Loss/Stressor/Fears (CP): Medical condition, self Techniques to Cope with Loss/Stress/Change: Diversional activities Quality of Family Relationships: supportive, helpful, involved Do you feel physically threatened by others?: No    08/28/2024    PHQ2-9 Depression Screening   Little interest or pleasure in  doing things Nearly every day  Feeling down, depressed, or hopeless Nearly every day  PHQ-2 - Total Score 6  Trouble falling or staying asleep, or sleeping too much Not at all  Feeling tired or having little  energy Several days  Poor appetite or overeating  Several days  Feeling bad about yourself - or that you are a failure or have let yourself or your family down Nearly every day  Trouble concentrating on things, such as reading the newspaper or watching television Nearly every day  Moving or speaking so slowly that other people could have noticed.  Or the opposite - being so fidgety or restless that you have been moving around a lot more than usual Not at all  Thoughts that you would be better off dead, or hurting yourself in some way Not at all  PHQ2-9 Total Score 14  If you checked off any problems, how difficult have these problems made it for you to do your work, take care of things at home, or get along with other people Very difficult  Depression Interventions/Treatment Counseling, Medication    There were no vitals filed for this visit.  Medications Reviewed Today     Reviewed by Bertrum Rosina HERO, RN (Registered Nurse) on 08/28/24 at 1210  Med List Status: <None>   Medication Order Taking? Sig Documenting Provider Last Dose Status Informant  busPIRone  (BUSPAR ) 10 MG tablet [id number] Yes Take 1 tablet (10 mg total) by mouth 2 (two) times daily. Raliegh Marsa RAMAN, DO  Active Self, Pharmacy Records  diazePAM , 15 MG Dose, (VALTOCO  15 MG DOSE) 2 x 7.5 MG/0.1ML LQPK [id number] Yes Place 15 mg into the nose as needed (For seizure lasting more than 2 minutes). Gregg Lek, MD  Active Self, Pharmacy Records  Diclofenac  Sodium CR 100 MG 24 hr tablet [id number]  Take 1 tablet (100 mg total) by mouth daily.  Patient not taking: Reported on 08/28/2024   Palumbo, April, MD  Consider Medication Status and Discontinue   famotidine  (PEPCID ) 20 MG tablet [id number]  Take 1 tablet (20 mg  total) by mouth 2 (two) times daily.  Patient not taking: Reported on 08/28/2024   Raenelle Coria, MD  Expired 02/25/24 2359   lacosamide  (VIMPAT ) 200 MG TABS tablet [id number] Yes TAKE 1 TABLET(200 MG) BY MOUTH TWICE DAILY Camara, Amadou, MD  Active   lidocaine  (LIDODERM ) 5 % [id number]  Place 1 patch onto the skin daily. Remove & Discard patch within 12 hours or as directed by MD  Patient not taking: Reported on 08/28/2024   Palumbo, April, MD  Consider Medication Status and Discontinue   ondansetron  (ZOFRAN -ODT) 4 MG disintegrating tablet [id number]  Take 1 tablet (4 mg total) by mouth every 8 (eight) hours as needed for nausea or vomiting.  Patient not taking: Reported on 08/28/2024   Zelaya, Oscar A, PA-C  Consider Medication Status and Discontinue Self, Pharmacy Records           Med Note LEOBARDO SCHUYLER RAMAN   Sat Apr 19, 2024  8:02 PM)    pantoprazole  (PROTONIX ) 40 MG tablet [id number] Yes Take 1 tablet (40 mg total) by mouth daily. Henson, [name], NP-C  Active   QUEtiapine  (SEROQUEL ) 50 MG tablet [id number] Yes Take 1 tablet (50 mg total) by mouth at bedtime. Raliegh Marsa RAMAN, DO  Active Self, Pharmacy Records  sertraline  (ZOLOFT ) 100 MG tablet [id number] Yes Take 1 tablet (100 mg total) by mouth daily. Raliegh Marsa RAMAN, DO  Active Self, Pharmacy Records            Recommendation:   Continue Current Plan of Care  Follow Up Plan:   Referral to Licensed Clinical Social Worker BSW Closing From:  Complex Care Management- Declined  RNCM at this time.  Rosina Forte, BSN RN [facility], [facility] RN Care Manager Direct Dial: [phone]  Fax: [phone]

## 2024-08-28 NOTE — Patient Instructions (Signed)
 Visit Information  Mr. Maxwell Faulkner was given information about Medicaid Managed Care team care coordination services as a part of their Healthy [facility] Medicaid benefit. Maxwell Faulkner   If you would like to schedule transportation through your [facility] plan, please call the following number at least 2 days in advance of your appointment: [phone]  For information about your ride after you set it up, call Ride Assist at [phone]. Use this number to activate a Will Call pickup, or if your transportation is late for a scheduled pickup. Use this number, too, if you need to make a change or cancel a previously scheduled reservation.  If you need transportation services right away, call [phone]. The after-hours call center is staffed 24 hours to handle ride assistance and urgent reservation requests (including discharges) 365 days a year. Urgent trips include sick visits, hospital discharge requests and life-sustaining treatment.  Call the [facility] Line at [phone], at any time, 24 hours a day, 7 days a week. If you are in danger or need immediate medical attention call 911.   Please see education materials related to Seizures provided by MyChart link.  Patient verbalizes understanding of instructions and care plan provided today and agrees to view in MyChart. Active MyChart status and patient understanding of how to access instructions and care plan via MyChart confirmed with patient.     No further follow up required: Patient declined RNCM at this time  Rosina Forte, BSN RN [facility], [facility] RN Care Manager Direct Dial: [phone]  Fax: [phone]   Following is a copy of your plan of care:  There are no care plans that you recently modified to display for this patient.

## 2024-09-01 ENCOUNTER — Encounter: Payer: Self-pay | Admitting: Neurology

## 2024-09-01 ENCOUNTER — Ambulatory Visit: Admitting: Neurology

## 2024-09-01 VITALS — BP 115/82 | HR 93 | Ht 66.0 in | Wt 109.0 lb

## 2024-09-01 DIAGNOSIS — G40019 Localization-related (focal) (partial) idiopathic epilepsy and epileptic syndromes with seizures of localized onset, intractable, without status epilepticus: Secondary | ICD-10-CM | POA: Diagnosis not present

## 2024-09-01 MED ORDER — VALTOCO 15 MG DOSE 2 X 7.5 MG/0.1ML NA LQPK: 15.0000 mg | NASAL | 5 refills | Status: AC | PRN

## 2024-09-01 MED ORDER — CENOBAMATE 14 X 12.5 MG & 14 X 25 MG PO TBPK: ORAL_TABLET | ORAL | 0 refills | Status: DC
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
## 2024-09-01 NOTE — Patient Instructions (Signed)
 Continue with Lacosamide  200 mg twice daily  Start Cenobamate 12.5 mg daily for 2 weeks then increase to 25 mg daily for 2 weeks. We will continue the titration to a goal of 150 mg daily  Will refill the Valtoco   Follow up in 6 months or sooner if worse

## 2024-09-01 NOTE — Progress Notes (Unsigned)
 New Patient Pulmonology Office Visit   Subjective:  Patient ID: Maxwell Faulkner, male    DOB: May 06, 1986  MRN: [id number]  Referred by: Lendia Boby LITTIE, NP-C  CC: No chief complaint on file.   HPI Maxwell Faulkner is a 38 y.o. male with hx of anxiety and seizures who presents for initial consultation in the setting of emphysema.    {PULM QUESTIONNAIRES (Optional):33196}  ROS  Allergies: Depakote  [divalproex  sodium]  Current Outpatient Medications:    busPIRone  (BUSPAR ) 10 MG tablet, Take 1 tablet (10 mg total) by mouth 2 (two) times daily., Disp: 60 tablet, Rfl: 2   Cenobamate 14 x 12.5 MG & 14 x 25 MG TBPK, 12.5 mg daily for 14 days then 25 mg daily for 14 days, Disp: 28 tablet, Rfl: 0   diazePAM , 15 MG Dose, (VALTOCO  15 MG DOSE) 2 x 7.5 MG/0.1ML LQPK, Place 15 mg into the nose as needed (For seizure lasting more than 2 minutes)., Disp: 5 each, Rfl: 5   Diclofenac  Sodium CR 100 MG 24 hr tablet, Take 1 tablet (100 mg total) by mouth daily., Disp: 10 tablet, Rfl: 0   famotidine  (PEPCID ) 20 MG tablet, Take 1 tablet (20 mg total) by mouth 2 (two) times daily., Disp: 60 tablet, Rfl: 0   lacosamide  (VIMPAT ) 200 MG TABS tablet, TAKE 1 TABLET(200 MG) BY MOUTH TWICE DAILY, Disp: 180 tablet, Rfl: 5   lidocaine  (LIDODERM ) 5 %, Place 1 patch onto the skin daily. Remove & Discard patch within 12 hours or as directed by MD, Disp: 30 patch, Rfl: 0   ondansetron  (ZOFRAN -ODT) 4 MG disintegrating tablet, Take 1 tablet (4 mg total) by mouth every 8 (eight) hours as needed for nausea or vomiting., Disp: 20 tablet, Rfl: 0   pantoprazole  (PROTONIX ) 40 MG tablet, Take 1 tablet (40 mg total) by mouth daily., Disp: 30 tablet, Rfl: 1   QUEtiapine  (SEROQUEL ) 50 MG tablet, Take 1 tablet (50 mg total) by mouth at bedtime., Disp: 90 tablet, Rfl: 0   sertraline  (ZOLOFT ) 100 MG tablet, Take 1 tablet (100 mg total) by mouth daily., Disp: 30 tablet, Rfl: 2 Past Medical History:  Diagnosis Date   Acute pancreatitis  01/17/2024   Anemia    Anxiety    Aortic atherosclerosis 01/11/2024   Depression    History of colitis 01/11/2024   Hypertension    Pulmonary emphysema (HCC) 01/11/2024   Seizures (HCC)    Past Surgical History:  Procedure Laterality Date   WISDOM TOOTH EXTRACTION     Family History  Problem Relation Age of Onset   Migraines Mother    Hypertension Mother    Social History   Socioeconomic History   Marital status: Significant Other    Spouse name: sierra whitsett   Number of children: 3   Years of education: Not on file   Highest education level: Not on file  Occupational History   Not on file  Tobacco Use   Smoking status: Every Day    Current packs/day: 1.00    Types: Cigarettes   Smokeless tobacco: Never  Vaping Use   Vaping status: Never Used  Substance and Sexual Activity   Alcohol use: Not Currently    Comment: stopped drinking   Drug use: Yes    Types: Marijuana    Comment: smokes daily   Sexual activity: Yes    Birth control/protection: None  Other Topics Concern   Not on file  Social History Narrative   Right handed  Caffeine 1 cup daily   Social Drivers of Health   Financial Resource Strain: Not on file  Food Insecurity: Food Insecurity Present (08/28/2024)   Hunger Vital Sign    Worried About Running Out of Food in the Last Year: Often true    Ran Out of Food in the Last Year: Often true  Transportation Needs: Unmet Transportation Needs (08/28/2024)   PRAPARE - Administrator, Civil Service (Medical): Yes    Lack of Transportation (Non-Medical): No  Physical Activity: Not on file  Stress: Not on file  Social Connections: Patient Declined (01/25/2024)   Social Connection and Isolation Panel    Frequency of Communication with Friends and Family: Patient declined    Frequency of Social Gatherings with Friends and Family: Patient declined    Attends Religious Services: Patient declined    Database Administrator or Organizations:  Patient declined    Attends Banker Meetings: Patient declined    Marital Status: Patient declined  Intimate Partner Violence: Not At Risk (08/28/2024)   Humiliation, Afraid, Rape, and Kick questionnaire    Fear of Current or Ex-Partner: No    Emotionally Abused: No    Physically Abused: No    Sexually Abused: No       Objective:  There were no vitals taken for this visit. {Pulm Vitals (Optional):32837}  Physical Exam  Diagnostic Review:  {Labs (Optional):32838}  AEC 01/22/24: 500 cells/uL  CXR 07/2024: severe emphysema with bullous change R>L  CT Chest 10/2023: IMPRESSION: 1. No acute posttraumatic sequelae in the chest, abdomen or pelvis. 2. Severe emphysema. 3. Stable 4 mm ground-glass nodule in the right upper lobe. No follow-up recommended. This recommendation follows the consensus statement: Guidelines for Management of Incidental Pulmonary Nodules Detected on CT Images: From the Fleischner Society 2017; Radiology 2017; 284:228-243. 4. Left hydrocele.   Emphysema (ICD10-J43.9).    Assessment & Plan:   Assessment & Plan   No orders of the defined types were placed in this encounter.     No follow-ups on file.   Royal Vandevoort, MD

## 2024-09-02 ENCOUNTER — Encounter (HOSPITAL_BASED_OUTPATIENT_CLINIC_OR_DEPARTMENT_OTHER): Payer: Self-pay | Admitting: Pulmonary Disease

## 2024-09-02 ENCOUNTER — Telehealth (HOSPITAL_COMMUNITY): Payer: Self-pay

## 2024-09-02 ENCOUNTER — Ambulatory Visit (HOSPITAL_BASED_OUTPATIENT_CLINIC_OR_DEPARTMENT_OTHER): Admitting: Pulmonary Disease

## 2024-09-02 VITALS — BP 130/89 | HR 82 | Ht 66.0 in | Wt 105.9 lb

## 2024-09-02 DIAGNOSIS — J439 Emphysema, unspecified: Secondary | ICD-10-CM | POA: Diagnosis not present

## 2024-09-02 DIAGNOSIS — F17210 Nicotine dependence, cigarettes, uncomplicated: Secondary | ICD-10-CM

## 2024-09-02 DIAGNOSIS — Z23 Encounter for immunization: Secondary | ICD-10-CM | POA: Diagnosis not present

## 2024-09-02 MED ORDER — ALBUTEROL SULFATE 1.25 MG/3ML IN NEBU: 1.0000 | INHALATION_SOLUTION | Freq: Four times a day (QID) | RESPIRATORY_TRACT | 12 refills | Status: AC | PRN

## 2024-09-02 MED ORDER — ALBUTEROL SULFATE HFA 108 (90 BASE) MCG/ACT IN AERS: 2.0000 | INHALATION_SPRAY | Freq: Four times a day (QID) | RESPIRATORY_TRACT | 2 refills | Status: DC | PRN

## 2024-09-02 MED ORDER — NICOTINE 14 MG/24HR TD PT24: MEDICATED_PATCH | TRANSDERMAL | 0 refills | Status: DC

## 2024-09-02 MED ORDER — NICOTINE POLACRILEX 4 MG MT GUM: CHEWING_GUM | OROMUCOSAL | 0 refills | Status: DC

## 2024-09-02 MED ORDER — NICOTINE 7 MG/24HR TD PT24: MEDICATED_PATCH | TRANSDERMAL | 0 refills | Status: DC

## 2024-09-02 MED ORDER — STIOLTO RESPIMAT 2.5-2.5 MCG/ACT IN AERS: 2.0000 | INHALATION_SPRAY | Freq: Every day | RESPIRATORY_TRACT | 6 refills | Status: DC

## 2024-09-02 MED ORDER — STIOLTO RESPIMAT 2.5-2.5 MCG/ACT IN AERS: 2.0000 | INHALATION_SPRAY | Freq: Every day | RESPIRATORY_TRACT | Status: DC

## 2024-09-02 NOTE — Patient Instructions (Addendum)
 VISIT SUMMARY: During your visit, we addressed your persistent cough, shortness of breath, and emphysema. We also discussed your tobacco and marijuana use and provided recommendations to help manage these issues.  YOUR PLAN: EMPHYSEMA: You have chronic emphysema with significant coughing, shortness of breath, and wheezing. -Start using the Stiolto inhaler, 2 puffs in the morning. -Use the albuterol inhaler, 2 puffs every 4 hours as needed. -Set up and use the nebulizer with albuterol solution every 4 hours as needed. -A pulmonary function test has been ordered to assess your lung function. -An alpha-1 antitrypsin deficiency test has been ordered to rule out this condition. -Will refer you to pulmonary rehab  TOBACCO USE DISORDER: You have a long-standing tobacco use disorder and currently smoke cigarettes. -Start using nicotine patches daily. -Use nicotine lozenges to help with cravings. -It is very important to stop smoking to prevent further lung damage.  MARIJUANA USE: You currently smoke marijuana, which can also affect your lungs. -Switch from smoking marijuana to using edibles to reduce lung damage.  Contains text generated by [employer].

## 2024-09-02 NOTE — Assessment & Plan Note (Signed)
 Emphysema Chronic emphysema with significant coughing, dyspnea, and wheezing. Differential diagnosis includes alpha-1 antitrypsin deficiency due to young age and severity +/- smoking. - Prescribed Stiolto inhaler, 2 puffs in the morning. - Prescribed albuterol inhaler, 2 puffs every 4 hours as needed. - Set up nebulizer with albuterol solution for use every 4 hours as needed. - Ordered pulmonary function test. - Ordered alpha-1 antitrypsin deficiency test. - Referred to pulmonary rehab - Discussed smoking cessation in detail.

## 2024-09-02 NOTE — Telephone Encounter (Signed)
 Pt stated that he is unable to participate in the pulmonary rehab program because he has seizures and that he is not comfortable getting on any equipment. I advised pt that if anything changes to call back.   Closed referral

## 2024-09-03 ENCOUNTER — Ambulatory Visit: Payer: Self-pay | Admitting: Pulmonary Disease

## 2024-09-03 ENCOUNTER — Telehealth (HOSPITAL_BASED_OUTPATIENT_CLINIC_OR_DEPARTMENT_OTHER): Payer: Self-pay

## 2024-09-03 LAB — ALPHA-1-ANTITRYPSIN: A-1 Antitrypsin: 184 mg/dL — ABNORMAL HIGH (ref 95–164)

## 2024-09-03 NOTE — Telephone Encounter (Signed)
 CMN received for nebulizer  signed by provider and faxed confirmation received

## 2024-09-05 ENCOUNTER — Other Ambulatory Visit: Payer: Self-pay

## 2024-09-05 NOTE — Patient Outreach (Signed)
 Social Drivers of Health  Community Resource and Care Coordination Visit Note   09/05/2024  Name: Maxwell Faulkner MRN: [id number] DOB:01-Nov-1985  Situation: Referral received for [facility] needs assessment and assistance related to PCS/Life Alert/Service Animal. I obtained verbal consent from Patient.  Visit completed with Patient on the phone.   Background:   SDOH Interventions Today    Flowsheet Row Most Recent Value  SDOH Interventions   Financial Strain Interventions Intervention Not Indicated  [Family provides support, patient applying for disability 1.5 yrs]     Assessment:   Goals Addressed             This Visit's Progress    BSW Goals       Current SDOH Barriers:  Patient needs life alert and service animal.  Patient family provides care and PCS is not currently needed.  Interventions: Provided patient with information about Service Animals and Life Alert Advised patient to follow up with [employer] for extra benefits Patient will wait until he has income to obtain a service animal and life alert.            Recommendation:   Patient will contact insurance for extra benefits.  Patient will consider a service animal once he has income.  Patient will work with attorney on disability application.  Follow Up Plan:   Patient has achieved all patient stated goals. [employer] will be closed. Patient has been provided contact information should new needs arise.   Tillman Gardener, BSW [place]  [facility] Social Worker Direct Dial: [phone]  Fax: [phone] Website: [email or web address]

## 2024-09-05 NOTE — Patient Instructions (Signed)
 Visit Information  Thank you for taking time to visit with me today. Please don't hesitate to contact me if I can be of assistance to you before our next scheduled appointment.   Following is a copy of your care plan:   Goals Addressed             This Visit's Progress    BSW Goals       Current SDOH Barriers:  Patient needs life alert and service animal.  Patient family provides care and PCS is not currently needed.  Interventions: Provided patient with information about Service Animals and Life Alert Advised patient to follow up with [employer] for extra benefits Patient will wait until he has income to obtain a service animal and life alert.            Please call 911 if you are experiencing a Mental Health or Behavioral Health Crisis or need someone to talk to.  Patient verbalized understanding of Care plan and visit instructions communicated this visit  Maxwell Faulkner, BSW [place]  [facility], [facility] Social Worker Direct Dial: [phone]  Fax: [phone] Website: [email or web address]

## 2024-09-11 ENCOUNTER — Telehealth: Payer: Self-pay | Admitting: Licensed Clinical Social Worker

## 2024-09-11 ENCOUNTER — Encounter: Payer: Self-pay | Admitting: Licensed Clinical Social Worker

## 2024-09-11 NOTE — Patient Instructions (Signed)
 [name] - I am sorry I was unable to reach you today for our scheduled appointment. I work with Lendia, [name], NP-C and am calling to support your healthcare needs. Please contact me at [phone] at your earliest convenience. I look forward to speaking with you soon.   Thank you,  Lyle Rung, BSW, MSW, LCSW Licensed Clinical Social Worker [employer]   [facility] [email or web address] Direct Dial: [phone]

## 2024-09-23 ENCOUNTER — Encounter: Payer: Self-pay | Admitting: Neurology

## 2024-09-24 ENCOUNTER — Other Ambulatory Visit: Payer: Self-pay | Admitting: Neurology

## 2024-09-24 MED ORDER — CENOBAMATE 150 MG PO TABS: 150.0000 mg | ORAL_TABLET | Freq: Every day | ORAL | 5 refills | Status: AC

## 2024-09-24 MED ORDER — CENOBAMATE 14 X 50 MG & 14 X100 MG PO TBPK: ORAL_TABLET | ORAL | 0 refills | Status: AC

## 2024-10-03 ENCOUNTER — Other Ambulatory Visit: Payer: Self-pay | Admitting: Licensed Clinical Social Worker

## 2024-10-03 ENCOUNTER — Encounter: Payer: Self-pay | Admitting: Licensed Clinical Social Worker

## 2024-10-03 NOTE — Patient Instructions (Signed)
 Maxwell Faulkner - I am sorry I was unable to reach you today for our scheduled appointment. I work with Lendia, [name], NP-C and am calling to support your healthcare needs. Please contact me at [phone] at your earliest convenience. I look forward to speaking with you soon.   Thank you,  Lyle Rung, BSW, MSW, LCSW Licensed Clinical Social Worker [employer]   [facility] [email or web address] Direct Dial: [phone]

## 2024-10-07 ENCOUNTER — Ambulatory Visit (HOSPITAL_COMMUNITY)

## 2024-11-05 ENCOUNTER — Telehealth (HOSPITAL_COMMUNITY): Payer: Self-pay

## 2024-11-05 NOTE — Telephone Encounter (Signed)
 Maxwell Faulkner calls and leaves a message for this therapist requesting to reschedule his last appointment that he missed. Therapist calls Maxwell Faulkner and he answers. Therapist confirms she is speaking to the correct person by obtaining two verifiers. Therapist offers Maxwell Faulkner the next available appointment which will be on 12-09-24 at 2pm. He agrees to take the appointment. Therapist messages front desk requesting this be scheduled.  Darice Simpler, MS, LMFT, LCAS

## 2024-11-10 NOTE — Progress Notes (Unsigned)
 "  Established Patient Pulmonology Office Visit   Subjective:  Patient ID: Maxwell Faulkner, male    DOB: 1986/02/11  MRN: [id number]  CC: No chief complaint on file.   HPI  Maxwell Faulkner is a 39 y.o. male with hx of anxiety, seizures, and emphysema who presents for follow up.  Last seen on 09/02/24, started stiolto and ordered A1AT and PFTs, discussed smoking cessation. Also, discussed pulmonary rehab.  {PULM QUESTIONNAIRES (Optional):33196}  ROS  {History (Optional):23778} Current Medications[1]      Objective:  There were no vitals taken for this visit. {Pulm Vitals (Optional):32837}  Physical Exam   Diagnostic Review:  {Labs (Optional):32838}  A1AT level normal  CXR 07/2024: severe emphysema with bullous change R>L   CT Chest 10/2023: IMPRESSION: 1. No acute posttraumatic sequelae in the chest, abdomen or pelvis. 2. Severe emphysema. 3. Stable 4 mm ground-glass nodule in the right upper lobe. No follow-up recommended. This recommendation follows the consensus statement: Guidelines for Management of Incidental Pulmonary Nodules Detected on CT Images: From the Fleischner Society 2017; Radiology 2017; 284:228-243. 4. Left hydrocele.   Emphysema (ICD10-J43.9).      Assessment & Plan:   Assessment & Plan   No orders of the defined types were placed in this encounter.     No follow-ups on file.   [name], MD    [1]  Current Outpatient Medications:    albuterol  (ACCUNEB ) 1.25 MG/3ML nebulizer solution, Take 3 mLs (1.25 mg total) by nebulization every 6 (six) hours as needed for wheezing., Disp: 75 mL, Rfl: 12   albuterol  (VENTOLIN  HFA) 108 (90 Base) MCG/ACT inhaler, Inhale 2 puffs into the lungs every 6 (six) hours as needed for wheezing or shortness of breath., Disp: 8 g, Rfl: 2   busPIRone  (BUSPAR ) 10 MG tablet, Take 1 tablet (10 mg total) by mouth 2 (two) times daily. (Patient not taking: Reported on 09/02/2024), Disp: 60 tablet, Rfl: 2    Cenobamate  150 MG TABS, Take 1 tablet (150 mg total) by mouth daily at 12 noon., Disp: 30 tablet, Rfl: 5   diazePAM , 15 MG Dose, (VALTOCO  15 MG DOSE) 2 x 7.5 MG/0.1ML LQPK, Place 15 mg into the nose as needed (For seizure lasting more than 2 minutes)., Disp: 5 each, Rfl: 5   Diclofenac  Sodium CR 100 MG 24 hr tablet, Take 1 tablet (100 mg total) by mouth daily., Disp: 10 tablet, Rfl: 0   famotidine  (PEPCID ) 20 MG tablet, Take 1 tablet (20 mg total) by mouth 2 (two) times daily., Disp: 60 tablet, Rfl: 0   lacosamide  (VIMPAT ) 200 MG TABS tablet, TAKE 1 TABLET(200 MG) BY MOUTH TWICE DAILY, Disp: 180 tablet, Rfl: 5   lidocaine  (LIDODERM ) 5 %, Place 1 patch onto the skin daily. Remove & Discard patch within 12 hours or as directed by MD (Patient not taking: Reported on 09/02/2024), Disp: 30 patch, Rfl: 0   nicotine  (NICODERM CQ  - DOSED IN MG/24 HOURS) 14 mg/24hr patch, RX #1 Weeks 1-6: 14 mg x 1 patch daily. Wear for 24 hours. If you have sleep disturbances, remove at bedtime., Disp: 42 patch, Rfl: 0   nicotine  (NICODERM CQ  - DOSED IN MG/24 HR) 7 mg/24hr patch, RX #2 Weeks 7-8: 7 mg x 1 patch dailyWear for 24 hours. If you have sleep disturbances, remove at bedtime.., Disp: 14 patch, Rfl: 0   nicotine  polacrilex (NICORETTE ) 4 MG gum, RX #1 Weeks 1-6: 1 piece every 1-2 hours.Use at least 9 pieces of gum per  day for the first 6 weeks. Max 24 pieces per day., Disp: 1008 each, Rfl: 0   ondansetron  (ZOFRAN -ODT) 4 MG disintegrating tablet, Take 1 tablet (4 mg total) by mouth every 8 (eight) hours as needed for nausea or vomiting., Disp: 20 tablet, Rfl: 0   pantoprazole  (PROTONIX ) 40 MG tablet, Take 1 tablet (40 mg total) by mouth daily., Disp: 30 tablet, Rfl: 1   QUEtiapine  (SEROQUEL ) 50 MG tablet, Take 1 tablet (50 mg total) by mouth at bedtime., Disp: 90 tablet, Rfl: 0   sertraline  (ZOLOFT ) 100 MG tablet, Take 1 tablet (100 mg total) by mouth daily., Disp: 30 tablet, Rfl: 2   Tiotropium Bromide-Olodaterol (STIOLTO  RESPIMAT) 2.5-2.5 MCG/ACT AERS, Inhale 2 puffs into the lungs daily., Disp: 12 g, Rfl: 6   Tiotropium Bromide-Olodaterol (STIOLTO RESPIMAT ) 2.5-2.5 MCG/ACT AERS, Inhale 2 puffs into the lungs daily., Disp: , Rfl:   "

## 2024-11-11 ENCOUNTER — Encounter (HOSPITAL_BASED_OUTPATIENT_CLINIC_OR_DEPARTMENT_OTHER)

## 2024-11-11 ENCOUNTER — Ambulatory Visit (HOSPITAL_BASED_OUTPATIENT_CLINIC_OR_DEPARTMENT_OTHER): Admitting: Pulmonary Disease

## 2024-11-11 NOTE — Progress Notes (Unsigned)
 "  Established Patient Pulmonology Office Visit   Subjective:  Patient ID: Maxwell Faulkner, male    DOB: 1986/02/11  MRN: [id number]  CC: No chief complaint on file.   HPI  Maxwell Faulkner is a 39 y.o. male with hx of anxiety, seizures, and emphysema who presents for follow up.  Last seen on 09/02/24, started stiolto and ordered A1AT and PFTs, discussed smoking cessation. Also, discussed pulmonary rehab.  {PULM QUESTIONNAIRES (Optional):33196}  ROS  {History (Optional):23778} Current Medications[1]      Objective:  There were no vitals taken for this visit. {Pulm Vitals (Optional):32837}  Physical Exam   Diagnostic Review:  {Labs (Optional):32838}  A1AT level normal  CXR 07/2024: severe emphysema with bullous change R>L   CT Chest 10/2023: IMPRESSION: 1. No acute posttraumatic sequelae in the chest, abdomen or pelvis. 2. Severe emphysema. 3. Stable 4 mm ground-glass nodule in the right upper lobe. No follow-up recommended. This recommendation follows the consensus statement: Guidelines for Management of Incidental Pulmonary Nodules Detected on CT Images: From the Fleischner Society 2017; Radiology 2017; 284:228-243. 4. Left hydrocele.   Emphysema (ICD10-J43.9).      Assessment & Plan:   Assessment & Plan   No orders of the defined types were placed in this encounter.     No follow-ups on file.   [name], MD    [1]  Current Outpatient Medications:    albuterol  (ACCUNEB ) 1.25 MG/3ML nebulizer solution, Take 3 mLs (1.25 mg total) by nebulization every 6 (six) hours as needed for wheezing., Disp: 75 mL, Rfl: 12   albuterol  (VENTOLIN  HFA) 108 (90 Base) MCG/ACT inhaler, Inhale 2 puffs into the lungs every 6 (six) hours as needed for wheezing or shortness of breath., Disp: 8 g, Rfl: 2   busPIRone  (BUSPAR ) 10 MG tablet, Take 1 tablet (10 mg total) by mouth 2 (two) times daily. (Patient not taking: Reported on 09/02/2024), Disp: 60 tablet, Rfl: 2    Cenobamate  150 MG TABS, Take 1 tablet (150 mg total) by mouth daily at 12 noon., Disp: 30 tablet, Rfl: 5   diazePAM , 15 MG Dose, (VALTOCO  15 MG DOSE) 2 x 7.5 MG/0.1ML LQPK, Place 15 mg into the nose as needed (For seizure lasting more than 2 minutes)., Disp: 5 each, Rfl: 5   Diclofenac  Sodium CR 100 MG 24 hr tablet, Take 1 tablet (100 mg total) by mouth daily., Disp: 10 tablet, Rfl: 0   famotidine  (PEPCID ) 20 MG tablet, Take 1 tablet (20 mg total) by mouth 2 (two) times daily., Disp: 60 tablet, Rfl: 0   lacosamide  (VIMPAT ) 200 MG TABS tablet, TAKE 1 TABLET(200 MG) BY MOUTH TWICE DAILY, Disp: 180 tablet, Rfl: 5   lidocaine  (LIDODERM ) 5 %, Place 1 patch onto the skin daily. Remove & Discard patch within 12 hours or as directed by MD (Patient not taking: Reported on 09/02/2024), Disp: 30 patch, Rfl: 0   nicotine  (NICODERM CQ  - DOSED IN MG/24 HOURS) 14 mg/24hr patch, RX #1 Weeks 1-6: 14 mg x 1 patch daily. Wear for 24 hours. If you have sleep disturbances, remove at bedtime., Disp: 42 patch, Rfl: 0   nicotine  (NICODERM CQ  - DOSED IN MG/24 HR) 7 mg/24hr patch, RX #2 Weeks 7-8: 7 mg x 1 patch dailyWear for 24 hours. If you have sleep disturbances, remove at bedtime.., Disp: 14 patch, Rfl: 0   nicotine  polacrilex (NICORETTE ) 4 MG gum, RX #1 Weeks 1-6: 1 piece every 1-2 hours.Use at least 9 pieces of gum per  day for the first 6 weeks. Max 24 pieces per day., Disp: 1008 each, Rfl: 0   ondansetron  (ZOFRAN -ODT) 4 MG disintegrating tablet, Take 1 tablet (4 mg total) by mouth every 8 (eight) hours as needed for nausea or vomiting., Disp: 20 tablet, Rfl: 0   pantoprazole  (PROTONIX ) 40 MG tablet, Take 1 tablet (40 mg total) by mouth daily., Disp: 30 tablet, Rfl: 1   QUEtiapine  (SEROQUEL ) 50 MG tablet, Take 1 tablet (50 mg total) by mouth at bedtime., Disp: 90 tablet, Rfl: 0   sertraline  (ZOLOFT ) 100 MG tablet, Take 1 tablet (100 mg total) by mouth daily., Disp: 30 tablet, Rfl: 2   Tiotropium Bromide-Olodaterol (STIOLTO  RESPIMAT) 2.5-2.5 MCG/ACT AERS, Inhale 2 puffs into the lungs daily., Disp: 12 g, Rfl: 6   Tiotropium Bromide-Olodaterol (STIOLTO RESPIMAT ) 2.5-2.5 MCG/ACT AERS, Inhale 2 puffs into the lungs daily., Disp: , Rfl:   "

## 2024-11-12 ENCOUNTER — Ambulatory Visit (HOSPITAL_BASED_OUTPATIENT_CLINIC_OR_DEPARTMENT_OTHER): Admitting: Pulmonary Disease

## 2024-11-12 ENCOUNTER — Encounter (HOSPITAL_BASED_OUTPATIENT_CLINIC_OR_DEPARTMENT_OTHER): Payer: Self-pay | Admitting: Pulmonary Disease

## 2024-11-12 VITALS — BP 118/77 | HR 87 | Temp 98.2°F | Ht 66.0 in | Wt 106.3 lb

## 2024-11-12 DIAGNOSIS — J439 Emphysema, unspecified: Secondary | ICD-10-CM | POA: Diagnosis not present

## 2024-11-12 DIAGNOSIS — F17210 Nicotine dependence, cigarettes, uncomplicated: Secondary | ICD-10-CM

## 2024-11-12 MED ORDER — BREZTRI AEROSPHERE 160-9-4.8 MCG/ACT IN AERO: 2.0000 | INHALATION_SPRAY | Freq: Two times a day (BID) | RESPIRATORY_TRACT | 6 refills | Status: AC

## 2024-11-12 MED ORDER — NICOTINE POLACRILEX 4 MG MT LOZG: LOZENGE | OROMUCOSAL | 0 refills | Status: AC

## 2024-11-12 MED ORDER — ALBUTEROL SULFATE HFA 108 (90 BASE) MCG/ACT IN AERS: 2.0000 | INHALATION_SPRAY | Freq: Four times a day (QID) | RESPIRATORY_TRACT | 6 refills | Status: AC | PRN

## 2024-11-12 MED ORDER — NICOTINE 7 MG/24HR TD PT24: MEDICATED_PATCH | TRANSDERMAL | 0 refills | Status: AC

## 2024-11-12 MED ORDER — NICOTINE 14 MG/24HR TD PT24: MEDICATED_PATCH | TRANSDERMAL | 0 refills | Status: AC

## 2024-11-12 MED ORDER — NICOTINE 21 MG/24HR TD PT24: MEDICATED_PATCH | TRANSDERMAL | 0 refills | Status: AC

## 2024-11-12 MED ORDER — NICOTINE POLACRILEX 4 MG MT GUM: CHEWING_GUM | OROMUCOSAL | 0 refills | Status: AC

## 2024-11-12 MED ORDER — BREZTRI AEROSPHERE 160-9-4.8 MCG/ACT IN AERO: 2.0000 | INHALATION_SPRAY | Freq: Two times a day (BID) | RESPIRATORY_TRACT | Status: AC

## 2024-11-12 NOTE — Assessment & Plan Note (Signed)
 Pulmonary emphysema Chronic emphysema with significant lung destruction from smoking. CT shows extensive emphysema. Current inhaler regimen ineffective. He's on Stiolto. Emphysema irreversible but stabilizable with smoking cessation. Continued smoking risks oxygen dependency and lung transplant. Class B, unknown Grade given lack of PFTs. - Switched to Breztri  inhaler, two puffs morning and evening, rinse mouth after use. - Provided Breztri  sample and instructed on use. - Continue albuterol  nebulizer every 4-6 hours as needed. - Encouraged nebulizer use before social outings to reduce coughing. - Ordered PFT at next visit. - Discussed pulmonary rehabilitation for exercise and smoking cessation support.

## 2024-11-12 NOTE — Patient Instructions (Signed)
" °  VISIT SUMMARY: You are a 39 year old with emphysema who came in due to persistent coughing and shortness of breath. We discussed your current symptoms, smoking history, and treatment options.  YOUR PLAN: PULMONARY EMPHYSEMA: You have chronic emphysema with significant lung damage from smoking. Your current inhaler regimen is not effective. -Switch to Breztri  inhaler, two puffs in the morning and evening. Rinse your mouth after use. -Use the provided Breztri  sample and follow the instructions. -Continue using the albuterol  nebulizer every 4-6 hours as needed. -Use the nebulizer before social outings to reduce coughing. -Complete the pulmonary function test (PFT) at your next visit. -Consider pulmonary rehabilitation for exercise and smoking cessation support.  CIGARETTE NICOTINE  DEPENDENCE: You have a long history of nicotine  dependence, and quitting smoking is crucial to prevent further lung damage. -Use the prescribed nicotine  patches and lozenges to help you quit smoking. -Call 1-800-QUIT-NOW for free nicotine  products and counseling. -Remove cigarettes from your home and use lozenges to manage cravings.  Contains text generated by [employer] "

## 2024-11-13 ENCOUNTER — Telehealth (HOSPITAL_COMMUNITY): Payer: Self-pay | Admitting: *Deleted

## 2024-11-13 NOTE — Telephone Encounter (Signed)
 Called and left message for pt on mobile number listed in Epic as preferred regarding referral for pulmonary rehab.  Direct contact information provided. Saturnino Bernett PEAK, BSN Cardiac and Emergency Planning/management Officer

## 2024-11-20 ENCOUNTER — Encounter: Payer: Self-pay | Admitting: Gastroenterology

## 2024-11-20 ENCOUNTER — Other Ambulatory Visit: Payer: Self-pay | Admitting: Licensed Clinical Social Worker

## 2024-11-20 ENCOUNTER — Encounter: Payer: Self-pay | Admitting: Internal Medicine

## 2024-11-20 ENCOUNTER — Encounter: Payer: Self-pay | Admitting: Licensed Clinical Social Worker

## 2024-11-20 ENCOUNTER — Encounter: Payer: Self-pay | Admitting: Neurology

## 2024-11-20 NOTE — Patient Instructions (Signed)
 [name] - I am sorry I was unable to reach you today for our appointment. I work with Lendia, [name], NP-C and am calling to support your healthcare needs. Please contact me at [phone] at your earliest convenience. I look forward to speaking with you soon.   Thank you,  Lyle Rung, BSW, MSW, LCSW Licensed Clinical Social Worker [employer]   [email or web address] Direct Dial: [phone]

## 2024-12-09 ENCOUNTER — Ambulatory Visit (HOSPITAL_COMMUNITY)

## 2024-12-16 ENCOUNTER — Ambulatory Visit: Admitting: Gastroenterology

## 2025-03-17 ENCOUNTER — Ambulatory Visit (HOSPITAL_BASED_OUTPATIENT_CLINIC_OR_DEPARTMENT_OTHER): Admitting: Pulmonary Disease

## 2025-03-31 ENCOUNTER — Ambulatory Visit: Admitting: Neurology

## 2025-04-13 ENCOUNTER — Ambulatory Visit: Admitting: Neurology
